# Patient Record
Sex: Male | Born: 1970
Health system: Southern US, Community
[De-identification: ages and names within clinical notes are randomized; demographics above are authoritative.]

## PROBLEM LIST (undated history)

## (undated) DIAGNOSIS — E119 Type 2 diabetes mellitus without complications: Secondary | ICD-10-CM

## (undated) DIAGNOSIS — G629 Polyneuropathy, unspecified: Secondary | ICD-10-CM

## (undated) DIAGNOSIS — I739 Peripheral vascular disease, unspecified: Secondary | ICD-10-CM

## (undated) DIAGNOSIS — I10 Essential (primary) hypertension: Secondary | ICD-10-CM

## (undated) DIAGNOSIS — M1990 Unspecified osteoarthritis, unspecified site: Secondary | ICD-10-CM

## (undated) HISTORY — PX: TRANSPLANT PANCREATIC ALLOGRAFT: SUR1383

## (undated) HISTORY — PX: ANKLE SURGERY: SHX546

## (undated) HISTORY — PX: BACK SURGERY: SHX140

## (undated) HISTORY — PX: HAND SURGERY: SHX662

## (undated) HISTORY — PX: KIDNEY TRANSPLANT: SHX239

## (undated) HISTORY — PX: FOOT SURGERY: SHX648

---

## 2011-02-08 DIAGNOSIS — E109 Type 1 diabetes mellitus without complications: Secondary | ICD-10-CM | POA: Insufficient documentation

## 2011-02-08 DIAGNOSIS — R809 Proteinuria, unspecified: Secondary | ICD-10-CM | POA: Insufficient documentation

## 2011-02-08 DIAGNOSIS — E113299 Type 2 diabetes mellitus with mild nonproliferative diabetic retinopathy without macular edema, unspecified eye: Secondary | ICD-10-CM | POA: Insufficient documentation

## 2011-02-08 DIAGNOSIS — E1042 Type 1 diabetes mellitus with diabetic polyneuropathy: Secondary | ICD-10-CM | POA: Insufficient documentation

## 2011-02-08 DIAGNOSIS — E1142 Type 2 diabetes mellitus with diabetic polyneuropathy: Secondary | ICD-10-CM | POA: Insufficient documentation

## 2012-01-17 DIAGNOSIS — G5750 Tarsal tunnel syndrome, unspecified lower limb: Secondary | ICD-10-CM | POA: Insufficient documentation

## 2012-01-17 DIAGNOSIS — S62109A Fracture of unspecified carpal bone, unspecified wrist, initial encounter for closed fracture: Secondary | ICD-10-CM | POA: Insufficient documentation

## 2013-07-19 DIAGNOSIS — E1143 Type 2 diabetes mellitus with diabetic autonomic (poly)neuropathy: Secondary | ICD-10-CM | POA: Insufficient documentation

## 2014-09-08 DIAGNOSIS — M19049 Primary osteoarthritis, unspecified hand: Secondary | ICD-10-CM | POA: Insufficient documentation

## 2015-05-12 DIAGNOSIS — E1065 Type 1 diabetes mellitus with hyperglycemia: Secondary | ICD-10-CM | POA: Insufficient documentation

## 2015-05-12 DIAGNOSIS — Z949 Transplanted organ and tissue status, unspecified: Secondary | ICD-10-CM | POA: Insufficient documentation

## 2015-05-12 DIAGNOSIS — I10 Essential (primary) hypertension: Secondary | ICD-10-CM | POA: Insufficient documentation

## 2015-05-12 DIAGNOSIS — E782 Mixed hyperlipidemia: Secondary | ICD-10-CM | POA: Insufficient documentation

## 2015-06-09 DIAGNOSIS — Z9489 Other transplanted organ and tissue status: Secondary | ICD-10-CM | POA: Diagnosis not present

## 2015-06-16 DIAGNOSIS — Z9489 Other transplanted organ and tissue status: Secondary | ICD-10-CM | POA: Diagnosis not present

## 2015-06-22 DIAGNOSIS — Z9489 Other transplanted organ and tissue status: Secondary | ICD-10-CM | POA: Diagnosis not present

## 2015-06-29 DIAGNOSIS — Z9489 Other transplanted organ and tissue status: Secondary | ICD-10-CM | POA: Diagnosis not present

## 2015-07-07 DIAGNOSIS — Z9489 Other transplanted organ and tissue status: Secondary | ICD-10-CM | POA: Diagnosis not present

## 2015-07-10 DIAGNOSIS — R21 Rash and other nonspecific skin eruption: Secondary | ICD-10-CM | POA: Diagnosis not present

## 2015-07-10 DIAGNOSIS — I10 Essential (primary) hypertension: Secondary | ICD-10-CM | POA: Diagnosis not present

## 2015-07-10 DIAGNOSIS — E11319 Type 2 diabetes mellitus with unspecified diabetic retinopathy without macular edema: Secondary | ICD-10-CM | POA: Diagnosis not present

## 2015-07-10 DIAGNOSIS — G4733 Obstructive sleep apnea (adult) (pediatric): Secondary | ICD-10-CM | POA: Diagnosis not present

## 2015-07-10 DIAGNOSIS — E782 Mixed hyperlipidemia: Secondary | ICD-10-CM | POA: Diagnosis not present

## 2015-07-10 DIAGNOSIS — N183 Chronic kidney disease, stage 3 (moderate): Secondary | ICD-10-CM | POA: Diagnosis not present

## 2015-07-10 DIAGNOSIS — G2581 Restless legs syndrome: Secondary | ICD-10-CM | POA: Diagnosis not present

## 2015-07-10 DIAGNOSIS — E1121 Type 2 diabetes mellitus with diabetic nephropathy: Secondary | ICD-10-CM | POA: Diagnosis not present

## 2015-07-20 DIAGNOSIS — Z9489 Other transplanted organ and tissue status: Secondary | ICD-10-CM | POA: Diagnosis not present

## 2015-09-14 DIAGNOSIS — Z9489 Other transplanted organ and tissue status: Secondary | ICD-10-CM | POA: Diagnosis not present

## 2015-10-12 DIAGNOSIS — Z9489 Other transplanted organ and tissue status: Secondary | ICD-10-CM | POA: Diagnosis not present

## 2015-11-05 DIAGNOSIS — M5136 Other intervertebral disc degeneration, lumbar region: Secondary | ICD-10-CM | POA: Diagnosis not present

## 2015-11-05 DIAGNOSIS — M9903 Segmental and somatic dysfunction of lumbar region: Secondary | ICD-10-CM | POA: Diagnosis not present

## 2015-11-05 DIAGNOSIS — M9904 Segmental and somatic dysfunction of sacral region: Secondary | ICD-10-CM | POA: Diagnosis not present

## 2015-11-05 DIAGNOSIS — M9905 Segmental and somatic dysfunction of pelvic region: Secondary | ICD-10-CM | POA: Diagnosis not present

## 2015-11-11 DIAGNOSIS — Z9489 Other transplanted organ and tissue status: Secondary | ICD-10-CM | POA: Diagnosis not present

## 2015-11-19 ENCOUNTER — Other Ambulatory Visit: Payer: Self-pay | Admitting: *Deleted

## 2015-11-19 ENCOUNTER — Ambulatory Visit (INDEPENDENT_AMBULATORY_CARE_PROVIDER_SITE_OTHER): Payer: Medicare Other

## 2015-11-19 ENCOUNTER — Ambulatory Visit (INDEPENDENT_AMBULATORY_CARE_PROVIDER_SITE_OTHER): Payer: Medicare Other | Admitting: Podiatry

## 2015-11-19 ENCOUNTER — Encounter: Payer: Self-pay | Admitting: Podiatry

## 2015-11-19 VITALS — BP 155/90 | HR 84 | Resp 16

## 2015-11-19 DIAGNOSIS — E0841 Diabetes mellitus due to underlying condition with diabetic mononeuropathy: Secondary | ICD-10-CM

## 2015-11-19 DIAGNOSIS — M722 Plantar fascial fibromatosis: Secondary | ICD-10-CM | POA: Diagnosis not present

## 2015-11-19 DIAGNOSIS — I10 Essential (primary) hypertension: Secondary | ICD-10-CM | POA: Insufficient documentation

## 2015-11-19 MED ORDER — TRIAMCINOLONE ACETONIDE 10 MG/ML IJ SUSP
10.0000 mg | Freq: Once | INTRAMUSCULAR | Status: AC
  Administered 2015-11-19: 10 mg

## 2015-11-19 NOTE — Patient Instructions (Signed)

## 2015-11-19 NOTE — Progress Notes (Signed)
   Subjective:    Patient ID: Gregory Allen, male    DOB: 1971-05-07, 45 y.o.   MRN: [id number]  HPI Comments: "I have a lot of pain on the inside of this foot"  Patient presents with: Foot Pain: Plantar heel left - aching for about 4 months, severe pain in AM, tried stretching, has neuropathy so he takes Lyrica for pain and sometimes Tylenol-not helping  Diabetic - last A1C was 6.5  Foot Pain      Review of Systems  Cardiovascular: Positive for leg swelling.  Musculoskeletal: Positive for gait problem.  All other systems reviewed and are negative.      Objective:   Physical Exam        Assessment & Plan:

## 2015-11-22 NOTE — Progress Notes (Signed)
Subjective:     Patient ID: Gregory Allen, male   DOB: [birth year], 45 y.o.   MRN: [id number]  HPI patient presents with a lot of pain in his left heel for 4 months duration and a long-term history of diabetes since he was a child that used to be in poor control and is now better   Review of Systems  All other systems reviewed and are negative.      Objective:   Physical Exam  Constitutional: He is oriented to person, place, and time.  Cardiovascular: Intact distal pulses.   Musculoskeletal: Normal range of motion.  Neurological: He is alert and oriented to person, place, and time.  Skin: Skin is warm and dry.  Nursing note and vitals reviewed.  neurovascular status was found to be intact with muscle strength adequate range of motion within normal limits and patient noted to have quite a bit of discomfort in the left plantar heel at the insertional point of the tendon into the calcaneus. Patient had mild distal disease secondary to long-term diabetes but he does appear to have adequate circulatory neurological sensation currently     Assessment:     Acute plantar fasciitis and long-term diabetic    Plan:     H&P and x-rays reviewed and today careful injection administered plantar heel left 3 mg Kenalog 5 mg Xylocaine and instructed on physical therapy and reduced activity. Reviewed calcification of the dorsalis pedis small arteries on his x-ray and I want him to take very good care of himself and if any issues were to occur be aggressive with returning to his physician. Reappoint in the next several weeks and also dispensed fascial brace

## 2015-11-23 DIAGNOSIS — M9905 Segmental and somatic dysfunction of pelvic region: Secondary | ICD-10-CM | POA: Diagnosis not present

## 2015-11-23 DIAGNOSIS — M9904 Segmental and somatic dysfunction of sacral region: Secondary | ICD-10-CM | POA: Diagnosis not present

## 2015-11-23 DIAGNOSIS — M9903 Segmental and somatic dysfunction of lumbar region: Secondary | ICD-10-CM | POA: Diagnosis not present

## 2015-11-23 DIAGNOSIS — M5136 Other intervertebral disc degeneration, lumbar region: Secondary | ICD-10-CM | POA: Diagnosis not present

## 2015-11-25 DIAGNOSIS — M9903 Segmental and somatic dysfunction of lumbar region: Secondary | ICD-10-CM | POA: Diagnosis not present

## 2015-11-25 DIAGNOSIS — M5136 Other intervertebral disc degeneration, lumbar region: Secondary | ICD-10-CM | POA: Diagnosis not present

## 2015-11-25 DIAGNOSIS — M9905 Segmental and somatic dysfunction of pelvic region: Secondary | ICD-10-CM | POA: Diagnosis not present

## 2015-11-25 DIAGNOSIS — M9904 Segmental and somatic dysfunction of sacral region: Secondary | ICD-10-CM | POA: Diagnosis not present

## 2015-11-26 DIAGNOSIS — I739 Peripheral vascular disease, unspecified: Secondary | ICD-10-CM | POA: Diagnosis not present

## 2015-11-30 DIAGNOSIS — M9903 Segmental and somatic dysfunction of lumbar region: Secondary | ICD-10-CM | POA: Diagnosis not present

## 2015-11-30 DIAGNOSIS — M9905 Segmental and somatic dysfunction of pelvic region: Secondary | ICD-10-CM | POA: Diagnosis not present

## 2015-11-30 DIAGNOSIS — M9904 Segmental and somatic dysfunction of sacral region: Secondary | ICD-10-CM | POA: Diagnosis not present

## 2015-11-30 DIAGNOSIS — M5136 Other intervertebral disc degeneration, lumbar region: Secondary | ICD-10-CM | POA: Diagnosis not present

## 2015-12-03 ENCOUNTER — Ambulatory Visit (INDEPENDENT_AMBULATORY_CARE_PROVIDER_SITE_OTHER): Payer: Medicare Other | Admitting: Podiatry

## 2015-12-03 ENCOUNTER — Encounter: Payer: Self-pay | Admitting: Podiatry

## 2015-12-03 VITALS — BP 158/87 | HR 79 | Resp 16

## 2015-12-03 DIAGNOSIS — M722 Plantar fascial fibromatosis: Secondary | ICD-10-CM

## 2015-12-03 MED ORDER — TRIAMCINOLONE ACETONIDE 10 MG/ML IJ SUSP
10.0000 mg | Freq: Once | INTRAMUSCULAR | Status: AC
  Administered 2015-12-03: 10 mg

## 2015-12-03 NOTE — Progress Notes (Signed)
Subjective:     Patient ID: Gregory Allen, male   DOB: 24-Sep-1971, 45 y.o.   MRN: [id number]  HPI patient presents stating I'm still getting a lot of pain in my left heel and it's worse after I get up in the morning or after periods of sitting. There has been some improvement but still sore and also circulation checked and I do wear orthotics   Review of Systems     Objective:   Physical Exam Neurovascular status unchanged with pain in the plantar aspect left heel that's mildly improved but still quite tender when palpated    Assessment:     Continue plantar fasciitis left    Plan:     I did reinject 3 mg Kenalog 5 mill grams Xylocaine and advised on checking his sugar more carefully over the next week. I then went ahead and dispensed night splint with all instructions on usage and patient will be seen back to recheck

## 2015-12-09 DIAGNOSIS — M9903 Segmental and somatic dysfunction of lumbar region: Secondary | ICD-10-CM | POA: Diagnosis not present

## 2015-12-09 DIAGNOSIS — M9904 Segmental and somatic dysfunction of sacral region: Secondary | ICD-10-CM | POA: Diagnosis not present

## 2015-12-09 DIAGNOSIS — M5136 Other intervertebral disc degeneration, lumbar region: Secondary | ICD-10-CM | POA: Diagnosis not present

## 2015-12-09 DIAGNOSIS — M9905 Segmental and somatic dysfunction of pelvic region: Secondary | ICD-10-CM | POA: Diagnosis not present

## 2015-12-14 DIAGNOSIS — M9904 Segmental and somatic dysfunction of sacral region: Secondary | ICD-10-CM | POA: Diagnosis not present

## 2015-12-14 DIAGNOSIS — M9905 Segmental and somatic dysfunction of pelvic region: Secondary | ICD-10-CM | POA: Diagnosis not present

## 2015-12-14 DIAGNOSIS — M5136 Other intervertebral disc degeneration, lumbar region: Secondary | ICD-10-CM | POA: Diagnosis not present

## 2015-12-14 DIAGNOSIS — M9903 Segmental and somatic dysfunction of lumbar region: Secondary | ICD-10-CM | POA: Diagnosis not present

## 2015-12-16 DIAGNOSIS — E108 Type 1 diabetes mellitus with unspecified complications: Secondary | ICD-10-CM | POA: Diagnosis not present

## 2015-12-16 DIAGNOSIS — Z9489 Other transplanted organ and tissue status: Secondary | ICD-10-CM | POA: Diagnosis not present

## 2015-12-21 ENCOUNTER — Emergency Department (HOSPITAL_COMMUNITY)
Admission: EM | Admit: 2015-12-21 | Discharge: 2015-12-21 | Disposition: A | Discharge status: Home / Self Care | Payer: Medicare Other | Attending: Emergency Medicine | Admitting: Emergency Medicine

## 2015-12-21 ENCOUNTER — Encounter (HOSPITAL_COMMUNITY): Payer: Self-pay | Admitting: Emergency Medicine

## 2015-12-21 ENCOUNTER — Emergency Department (HOSPITAL_COMMUNITY): Payer: Medicare Other

## 2015-12-21 DIAGNOSIS — R110 Nausea: Secondary | ICD-10-CM | POA: Diagnosis not present

## 2015-12-21 DIAGNOSIS — H539 Unspecified visual disturbance: Secondary | ICD-10-CM | POA: Insufficient documentation

## 2015-12-21 DIAGNOSIS — E119 Type 2 diabetes mellitus without complications: Secondary | ICD-10-CM | POA: Insufficient documentation

## 2015-12-21 DIAGNOSIS — Z794 Long term (current) use of insulin: Secondary | ICD-10-CM | POA: Diagnosis not present

## 2015-12-21 DIAGNOSIS — R42 Dizziness and giddiness: Secondary | ICD-10-CM | POA: Diagnosis not present

## 2015-12-21 DIAGNOSIS — R200 Anesthesia of skin: Secondary | ICD-10-CM | POA: Insufficient documentation

## 2015-12-21 DIAGNOSIS — Z79899 Other long term (current) drug therapy: Secondary | ICD-10-CM | POA: Insufficient documentation

## 2015-12-21 DIAGNOSIS — R112 Nausea with vomiting, unspecified: Secondary | ICD-10-CM | POA: Insufficient documentation

## 2015-12-21 DIAGNOSIS — R519 Headache, unspecified: Secondary | ICD-10-CM

## 2015-12-21 DIAGNOSIS — R51 Headache: Secondary | ICD-10-CM | POA: Diagnosis not present

## 2015-12-21 HISTORY — DX: Type 2 diabetes mellitus without complications: E11.9

## 2015-12-21 LAB — CBC
HEMATOCRIT: 44.4 % (ref 39.0–52.0)
Hemoglobin: 14.2 g/dL (ref 13.0–17.0)
MCH: 25.5 pg — ABNORMAL LOW (ref 26.0–34.0)
MCHC: 32 g/dL (ref 30.0–36.0)
MCV: 79.7 fL (ref 78.0–100.0)
PLATELETS: 245 10*3/uL (ref 150–400)
RBC: 5.57 MIL/uL (ref 4.22–5.81)
RDW: 13.3 % (ref 11.5–15.5)
WBC: 6.2 10*3/uL (ref 4.0–10.5)

## 2015-12-21 LAB — URINALYSIS, ROUTINE W REFLEX MICROSCOPIC
BILIRUBIN URINE: NEGATIVE
GLUCOSE, UA: NEGATIVE mg/dL
KETONES UR: 15 mg/dL — AB
Leukocytes, UA: NEGATIVE
NITRITE: NEGATIVE
PH: 5 (ref 5.0–8.0)
Specific Gravity, Urine: 1.021 (ref 1.005–1.030)

## 2015-12-21 LAB — BASIC METABOLIC PANEL
Anion gap: 12 (ref 5–15)
BUN: 19 mg/dL (ref 6–20)
CO2: 21 mmol/L — ABNORMAL LOW (ref 22–32)
CREATININE: 1.42 mg/dL — AB (ref 0.61–1.24)
Calcium: 9.3 mg/dL (ref 8.9–10.3)
Chloride: 108 mmol/L (ref 101–111)
GFR calc Af Amer: >60 mL/min (ref >60)
GFR, EST NON AFRICAN AMERICAN: 58 mL/min — AB (ref >60)
GLUCOSE: 70 mg/dL (ref 65–99)
POTASSIUM: 4.2 mmol/L (ref 3.5–5.1)
SODIUM: 141 mmol/L (ref 135–145)

## 2015-12-21 LAB — URINE MICROSCOPIC-ADD ON

## 2015-12-21 LAB — CBG MONITORING, ED: Glucose-Capillary: 117 mg/dL — ABNORMAL HIGH (ref 65–99)

## 2015-12-21 MED ORDER — KETOROLAC TROMETHAMINE 30 MG/ML IJ SOLN
30.0000 mg | Freq: Once | INTRAMUSCULAR | Status: AC
  Administered 2015-12-21: 30 mg via INTRAVENOUS
  Filled 2015-12-21: qty 1

## 2015-12-21 MED ORDER — DIPHENHYDRAMINE HCL 50 MG/ML IJ SOLN
25.0000 mg | Freq: Once | INTRAMUSCULAR | Status: AC
  Administered 2015-12-21: 25 mg via INTRAVENOUS
  Filled 2015-12-21: qty 1

## 2015-12-21 MED ORDER — METOCLOPRAMIDE HCL 5 MG/ML IJ SOLN
10.0000 mg | Freq: Once | INTRAMUSCULAR | Status: AC
  Administered 2015-12-21: 10 mg via INTRAVENOUS
  Filled 2015-12-21: qty 2

## 2015-12-21 MED ORDER — SODIUM CHLORIDE 0.9 % IV BOLUS (SEPSIS)
1000.0000 mL | Freq: Once | INTRAVENOUS | Status: AC
  Administered 2015-12-21: 1000 mL via INTRAVENOUS

## 2015-12-21 NOTE — ED Notes (Signed)
Pt ambulates independently and with steady gait at time of discharge. Discharge instructions and follow up information reviewed with patient. No other questions or concerns voiced at this time.  

## 2015-12-21 NOTE — ED Notes (Signed)
PTs CBG 117 reported to RN.

## 2015-12-21 NOTE — ED Notes (Signed)
pts IV removed and vital signs updated. Pt awaiting discharge paperwork at bedside.

## 2015-12-21 NOTE — ED Provider Notes (Signed)
CSN: [id number]     Arrival date & time 12/21/15  1158 History   By signing my name below, I, Dora Sims, attest that this documentation has been prepared under the direction and in the presence of non-physician practitioner, Delrae Rend, PA-C. Electronically Signed: Dora Sims, Scribe. 12/21/2015. 4:05 PM.    Chief Complaint  Patient presents with  . Headache    The history is provided by the patient. No language interpreter was used.     HPI Comments: Gregory Allen is a 45 y.o. male with h/o DM who presents to the Emergency Department complaining of severe, sharp, constant, sudden onset migraine beginning this morning. Pt states that the migraine is located on the left side of his face; he reports associated blurry vision in his left eye and numbness in his left hand. Pt states that he is able to ambulate but has had difficulty balancing since onset of symptoms due to dizziness. He reports associated nausea and vomiting. He reports that his last episode of vomiting occurred approximately one hour ago and denies hematemesis. He denies fever, diarrhea, chills, or any other associated symptoms at this time.    Past Medical History  Diagnosis Date  . Diabetes mellitus without complication [facility])    Past Surgical History  Procedure Laterality Date  . Transplant pancreatic allograft     No family history on file. Social History  Substance Use Topics  . Smoking status: Never Smoker   . Smokeless tobacco: None  . Alcohol Use: No    Review of Systems  Constitutional: Negative for fever and chills.  Eyes: Positive for visual disturbance (blurry vision left eye).  Gastrointestinal: Positive for nausea and vomiting. Negative for diarrhea.  Neurological: Positive for dizziness, numbness (left hand) and headaches. Negative for speech difficulty.  All other systems reviewed and are negative.     Allergies  Shellfish-derived products; Hydrocodone; and Oxycodone  Home  Medications   Prior to Admission medications   Medication Sig Start Date End Date Taking? Authorizing Provider  Ca & Phos-Vit D-Mag 600-280-500-50 TABS Take 1 tablet by mouth. 04/24/12   Historical Provider, MD  carvedilol (COREG) 6.25 MG tablet Take 6.25 mg by mouth.    Historical Provider, MD  Chlorzoxazone (LORZONE) 375 MG TABS Take 1 tablet by mouth.    Historical Provider, MD  DULoxetine (CYMBALTA) 60 MG capsule Take 60 mg by mouth.    Historical Provider, MD  glucagon (GLUCAGON EMERGENCY) 1 MG injection Inject 1 mg into the vein.    Historical Provider, MD  glucose blood (FREESTYLE TEST STRIPS) test strip To be tested 4 times daily 02/14/12   Historical Provider, MD  hydrALAZINE (APRESOLINE) 25 MG tablet Take 25 mg by mouth.    Historical Provider, MD  insulin aspart (NOVOLOG) 100 UNIT/ML injection As directed, using insulin pump. 07/18/12   Historical Provider, MD  Insulin Pen Needle (B-D ULTRAFINE III SHORT PEN) 31G X 8 MM MISC 1 each by Does not apply route. 07/18/12   Historical Provider, MD  Insulin Syringe-Needle U-100 (B-D INSULIN SYRINGE 1CC/25GX1") 25G X 1" 1 ML MISC Use as directed 07/19/12   Historical Provider, MD  losartan (COZAAR) 100 MG tablet TAKE 1 TABLET BY MOUTH ONCE DAILY. 11/04/12   Historical Provider, MD  mycophenolate (MYFORTIC) 180 MG EC tablet Take 320 mg by mouth 2 times daily.    Historical Provider, MD  pregabalin (LYRICA) 150 MG capsule Take 150 mg by mouth.    Historical Provider, MD  tacrolimus (PROGRAF) 5  MG capsule Take 5 mg by mouth 2 times daily.    Historical Provider, MD  valGANciclovir (VALCYTE) 450 MG tablet TAKE TWO TABLETS BY MOUTH ONCE DAILY 08/24/15   Historical Provider, MD   BP 139/84 mmHg  Pulse 75  Temp(Src) 98 F (36.7 C) (Oral)  Resp 16  Ht 5\' 10"  (1.778 m)  Wt 201 lb (91.173 kg)  BMI 28.84 kg/m2  SpO2 99% Physical Exam  Constitutional: He is oriented to person, place, and time. He appears well-developed and well-nourished. No distress.   HENT:  Head: Normocephalic and atraumatic.  Right Ear: External ear normal.  Left Ear: External ear normal.  Eyes: Conjunctivae are normal. Right eye exhibits no discharge. Left eye exhibits no discharge. No scleral icterus.  Neck: Neck supple.  Cardiovascular: Normal rate, regular rhythm and intact distal pulses.   Pulmonary/Chest: Effort normal and breath sounds normal. No respiratory distress.  Abdominal: Soft. Bowel sounds are normal. He exhibits no distension. There is no tenderness.  Musculoskeletal: He exhibits no edema or tenderness.  Neurological: He is alert and oriented to person, place, and time. He has normal strength. He displays no tremor. No cranial nerve deficit (no facial droop, extraocular movements intact, no slurred speech) or sensory deficit. Coordination and gait normal. GCS eye subscore is 4. GCS verbal subscore is 5. GCS motor subscore is 6.  Skin: Skin is warm and dry. No rash noted.  Psychiatric: He has a normal mood and affect.  Nursing note and vitals reviewed.   ED Course  Procedures (including critical care time)  DIAGNOSTIC STUDIES: Oxygen Saturation is 99% on RA, normal by my interpretation.    COORDINATION OF CARE:  4:06 PM Will administer fluids, Toradol, Reglan, and Benadryl. Will order CT Head w/o Contrast. Will order blood work. Discussed treatment plan with pt at bedside and pt agreed to plan.   Labs Review Labs Reviewed  BASIC METABOLIC PANEL - Abnormal; Notable for the following:    CO2 21 (*)    Creatinine, Ser 1.42 (*)    GFR calc non Af Amer 58 (*)    All other components within normal limits  CBC - Abnormal; Notable for the following:    MCH 25.5 (*)    All other components within normal limits  URINALYSIS, ROUTINE W REFLEX MICROSCOPIC (NOT AT [facility])  CBG MONITORING, ED    Imaging Review Ct Head Wo Contrast  12/21/2015  CLINICAL DATA:  Acute onset headache, dizziness and nausea today at 9 a.m. No known injury Initial encounter.  EXAM: CT HEAD WITHOUT CONTRAST TECHNIQUE: Contiguous axial images were obtained from the base of the skull through the vertex without intravenous contrast. COMPARISON:  None. FINDINGS: The brain appears normal without hemorrhage, infarct, mass lesion, mass effect, midline shift or abnormal extra-axial fluid collection. No hydrocephalus or pneumocephalus. The calvarium is intact. Imaged paranasal sinuses and mastoid air cells are clear. IMPRESSION: Normal head CT. Electronically Signed   By: Inge Rise M.D.   On: 12/21/2015 14:03   I have personally reviewed and evaluated these images and lab results as part of my medical decision-making.   EKG Interpretation None      MDM   Final diagnoses:  Acute nonintractable headache, unspecified headache type    Labs reveal SCr 1.42, CT head negative. Nonfocal neuro exam. Pt is afebrile with no meningismus. Headache resolved with 1L NS bolus, toradol, reglan, and benadryl. Instructed to f/u with PCP. ER return precautions given.   I personally performed the services  described in this documentation, which was scribed in my presence. The recorded information has been reviewed and is accurate.     Anne Ng, PA-C 12/21/15 [place], MD 12/22/15 [phone]

## 2015-12-21 NOTE — ED Notes (Addendum)
Pt states around 0900 he developed a sudden headache with nausea and dizziness. Pt states light makes his headache worse. Pt is alert and ox4. Pt has clear speech no other neuro deficits noted. PT HAS HAD PANCREATIC ISLET TRANSPLANT AND NOT TO HAVE ANY STEROIDS, OR MACROLIDES, OR d5 iv FLUID.

## 2015-12-21 NOTE — Discharge Instructions (Signed)
Your headache resolved with medication in the ER today. You likely had a migraine. Your labs were normal. Your CT scan was normal. Please follow up with your primary care provider within one week. Return to the ER for new or worsening symptoms.

## 2015-12-23 LAB — URINE CULTURE: Culture: NO GROWTH

## 2015-12-26 ENCOUNTER — Encounter (HOSPITAL_COMMUNITY): Payer: Self-pay

## 2015-12-26 DIAGNOSIS — E119 Type 2 diabetes mellitus without complications: Secondary | ICD-10-CM | POA: Diagnosis not present

## 2015-12-26 DIAGNOSIS — H538 Other visual disturbances: Secondary | ICD-10-CM | POA: Diagnosis not present

## 2015-12-26 DIAGNOSIS — R51 Headache: Secondary | ICD-10-CM | POA: Diagnosis not present

## 2015-12-26 LAB — CBC
HEMATOCRIT: 39.3 % (ref 39.0–52.0)
HEMOGLOBIN: 12.2 g/dL — AB (ref 13.0–17.0)
MCH: 24.7 pg — AB (ref 26.0–34.0)
MCHC: 31 g/dL (ref 30.0–36.0)
MCV: 79.6 fL (ref 78.0–100.0)
Platelets: 249 10*3/uL (ref 150–400)
RBC: 4.94 MIL/uL (ref 4.22–5.81)
RDW: 13.3 % (ref 11.5–15.5)
WBC: 5 10*3/uL (ref 4.0–10.5)

## 2015-12-26 NOTE — ED Notes (Signed)
Pt here for a bad headache was here a few days ago for the same thing and was not given any answers, pt reports this is only the third bad headahce he has ever had. Left eye blurred as well.

## 2015-12-27 ENCOUNTER — Emergency Department (HOSPITAL_COMMUNITY)
Admission: EM | Admit: 2015-12-27 | Discharge: 2015-12-27 | Disposition: A | Discharge status: Home / Self Care | Payer: Medicare Other | Attending: Emergency Medicine | Admitting: Emergency Medicine

## 2015-12-27 LAB — COMPREHENSIVE METABOLIC PANEL
ALBUMIN: 3.7 g/dL (ref 3.5–5.0)
ALK PHOS: 63 U/L (ref 38–126)
ALT: 19 U/L (ref 17–63)
ANION GAP: 10 (ref 5–15)
AST: 20 U/L (ref 15–41)
BILIRUBIN TOTAL: 0.2 mg/dL — AB (ref 0.3–1.2)
BUN: 20 mg/dL (ref 6–20)
CALCIUM: 8.8 mg/dL — AB (ref 8.9–10.3)
CO2: 21 mmol/L — ABNORMAL LOW (ref 22–32)
CREATININE: 1.46 mg/dL — AB (ref 0.61–1.24)
Chloride: 110 mmol/L (ref 101–111)
GFR calc Af Amer: >60 mL/min (ref >60)
GFR calc non Af Amer: 56 mL/min — ABNORMAL LOW (ref >60)
GLUCOSE: 116 mg/dL — AB (ref 65–99)
Potassium: 4.2 mmol/L (ref 3.5–5.1)
SODIUM: 141 mmol/L (ref 135–145)
TOTAL PROTEIN: 6.1 g/dL — AB (ref 6.5–8.1)

## 2015-12-27 NOTE — ED Notes (Signed)
No answer called x 3

## 2015-12-31 ENCOUNTER — Encounter: Payer: Self-pay | Admitting: Podiatry

## 2015-12-31 ENCOUNTER — Ambulatory Visit (INDEPENDENT_AMBULATORY_CARE_PROVIDER_SITE_OTHER): Payer: Medicare Other | Admitting: Podiatry

## 2015-12-31 ENCOUNTER — Telehealth: Payer: Self-pay | Admitting: *Deleted

## 2015-12-31 VITALS — BP 166/81 | HR 79 | Resp 16

## 2015-12-31 DIAGNOSIS — M722 Plantar fascial fibromatosis: Secondary | ICD-10-CM | POA: Diagnosis not present

## 2015-12-31 DIAGNOSIS — E0841 Diabetes mellitus due to underlying condition with diabetic mononeuropathy: Secondary | ICD-10-CM | POA: Diagnosis not present

## 2015-12-31 MED ORDER — NONFORMULARY OR COMPOUNDED ITEM: Status: DC

## 2015-12-31 NOTE — Progress Notes (Signed)
Subjective:     Patient ID: Gregory Allen, male   DOB: 08-Jul-1971, 45 y.o.   MRN: [id number]  HPI patient presents stating I'm still getting pain in my left heel and now my right foot hurts slightly   Review of Systems     Objective:   Physical Exam Long-term diabetic who most likely has circulatory disease that is followed by vascular doctor but there is nothing at this point they can do for it with plantar heel pain left over right    Assessment:     Plantar fasciitis with other conditions which may be contributory    Plan:     We are going to try cast immobilization to reduce pressure and we continue to try to avoid any form of surgical intervention

## 2015-12-31 NOTE — Telephone Encounter (Signed)
Dr. Paulla Dolly ordered [place] Peripheral Neuropathy cream.  Faxed.

## 2016-01-05 DIAGNOSIS — M9903 Segmental and somatic dysfunction of lumbar region: Secondary | ICD-10-CM | POA: Diagnosis not present

## 2016-01-05 DIAGNOSIS — M5136 Other intervertebral disc degeneration, lumbar region: Secondary | ICD-10-CM | POA: Diagnosis not present

## 2016-01-05 DIAGNOSIS — M9905 Segmental and somatic dysfunction of pelvic region: Secondary | ICD-10-CM | POA: Diagnosis not present

## 2016-01-05 DIAGNOSIS — M9904 Segmental and somatic dysfunction of sacral region: Secondary | ICD-10-CM | POA: Diagnosis not present

## 2016-01-12 DIAGNOSIS — M9903 Segmental and somatic dysfunction of lumbar region: Secondary | ICD-10-CM | POA: Diagnosis not present

## 2016-01-12 DIAGNOSIS — M9905 Segmental and somatic dysfunction of pelvic region: Secondary | ICD-10-CM | POA: Diagnosis not present

## 2016-01-12 DIAGNOSIS — M5136 Other intervertebral disc degeneration, lumbar region: Secondary | ICD-10-CM | POA: Diagnosis not present

## 2016-01-12 DIAGNOSIS — M9904 Segmental and somatic dysfunction of sacral region: Secondary | ICD-10-CM | POA: Diagnosis not present

## 2016-01-28 ENCOUNTER — Encounter: Payer: Self-pay | Admitting: Podiatry

## 2016-01-28 ENCOUNTER — Ambulatory Visit (INDEPENDENT_AMBULATORY_CARE_PROVIDER_SITE_OTHER): Payer: Medicare Other | Admitting: Podiatry

## 2016-01-28 VITALS — BP 160/91 | HR 77 | Resp 16

## 2016-01-28 DIAGNOSIS — M722 Plantar fascial fibromatosis: Secondary | ICD-10-CM

## 2016-01-28 DIAGNOSIS — Z9489 Other transplanted organ and tissue status: Secondary | ICD-10-CM | POA: Diagnosis not present

## 2016-01-28 NOTE — Patient Instructions (Signed)
Pre-Operative Instructions  Congratulations, you have decided to take an important step to improving your quality of life.  You can be assured that the doctors of [facility] will be with you every step of the way.  1. Plan to be at the surgery center/hospital at least 1 (one) hour prior to your scheduled time unless otherwise directed by the surgical center/hospital staff.  You must have a responsible adult accompany you, remain during the surgery and drive you home.  Make sure you have directions to the surgical center/hospital and know how to get there on time. 2. For hospital based surgery you will need to obtain a history and physical form from your family physician within 1 month prior to the date of surgery- we will give you a form for you primary physician.  3. We make every effort to accommodate the date you request for surgery.  There are however, times where surgery dates or times have to be moved.  We will contact you as soon as possible if a change in schedule is required.   4. No Aspirin/Ibuprofen for one week before surgery.  If you are on aspirin, any non-steroidal anti-inflammatory medications (Mobic, Aleve, Ibuprofen) you should stop taking it 7 days prior to your surgery.  You make take Tylenol  For pain prior to surgery.  5. Medications- If you are taking daily heart and blood pressure medications, seizure, reflux, allergy, asthma, anxiety, pain or diabetes medications, make sure the surgery center/hospital is aware before the day of surgery so they may notify you which medications to take or avoid the day of surgery. 6. No food or drink after midnight the night before surgery unless directed otherwise by surgical center/hospital staff. 7. No alcoholic beverages 24 hours prior to surgery.  No smoking 24 hours prior to or 24 hours after surgery. 8. Wear loose pants or shorts- loose enough to fit over bandages, boots, and casts. 9. No slip on shoes, sneakers are best. 10. Bring  your boot with you to the surgery center/hospital.  Also bring crutches or a walker if your physician has prescribed it for you.  If you do not have this equipment, it will be provided for you after surgery. 11. If you have not been contracted by the surgery center/hospital by the day before your surgery, call to confirm the date and time of your surgery. 12. Leave-time from work may vary depending on the type of surgery you have.  Appropriate arrangements should be made prior to surgery with your employer. 13. Prescriptions will be provided immediately following surgery by your doctor.  Have these filled as soon as possible after surgery and take the medication as directed. 14. Remove nail polish on the operative foot. 15. Wash the night before surgery.  The night before surgery wash the foot and leg well with the antibacterial soap provided and water paying special attention to beneath the toenails and in between the toes.  Rinse thoroughly with water and dry well with a towel.  Perform this wash unless told not to do so by your physician.  Enclosed: 1 Ice pack (please put in freezer the night before surgery)   1 Hibiclens skin cleaner   Pre-op Instructions  If you have any questions regarding the instructions, do not hesitate to call our office.  [place]: [address] [place], [place] [address] [phone]  [place]: [address] , [place] [address] [phone]  [place]: [address]  [place], [place] [address] [phone]  Dr. Richard   Tuchman DPM, Dr. Norman Regal DPM Dr. Richard Sikora DPM, Dr. M. Todd Hyatt DPM, Dr. Kathryn Egerton DPM 

## 2016-01-28 NOTE — Progress Notes (Signed)
Subjective:     Patient ID: Gregory Allen, male   DOB: 01/16/1971, 45 y.o.   MRN: [id number]  HPI patient states my left heel is still very sore and it's not allowing me to be active which I need to do for my health. My right heel and it of needing surgery and I think I'm get a needed on this one also   Review of Systems     Objective:   Physical Exam Neurovascular status found to be intact with no significant changes with exquisite discomfort in the plantar fascial left at the insertional point tendon the calcaneus. Digits were found to be well-perfused and warm and no other pathology is noted    Assessment:     Acute plantar fasciitis left it's failed to respond to conservative care    Plan:     I reviewed condition at great length and due to long-standing nature patient would like it to be corrected. I am going to get the results of his vascular studies prior to procedure but he does have good pulses and should heal well and I did allow him to read a consent form today going over all possible complications and all alternative treatments. He understands completely is no guarantee as far success of surgery are healing and the recovery can be approximate 6 months and he signed consent form and is scheduled for outpatient surgery. Patient is encouraged to call us with any questions prior to procedure

## 2016-01-29 ENCOUNTER — Telehealth: Payer: Self-pay | Admitting: *Deleted

## 2016-01-29 NOTE — Telephone Encounter (Signed)
Patient walked in and requested to reschedule his surgery from 02/16/2016 to 03/08/2016.  He stated he has a trip planned to [facility] that he had forgotten about.  Daughter is going to get her Ph D at [employer].  I called and informed Caren Griffins at [facility].

## 2016-02-22 DIAGNOSIS — Z9489 Other transplanted organ and tissue status: Secondary | ICD-10-CM | POA: Diagnosis not present

## 2016-02-22 DIAGNOSIS — E1065 Type 1 diabetes mellitus with hyperglycemia: Secondary | ICD-10-CM | POA: Diagnosis not present

## 2016-02-22 DIAGNOSIS — E559 Vitamin D deficiency, unspecified: Secondary | ICD-10-CM | POA: Diagnosis not present

## 2016-02-25 DIAGNOSIS — Z949 Transplanted organ and tissue status, unspecified: Secondary | ICD-10-CM | POA: Diagnosis not present

## 2016-02-25 DIAGNOSIS — I10 Essential (primary) hypertension: Secondary | ICD-10-CM | POA: Diagnosis not present

## 2016-02-25 DIAGNOSIS — E1065 Type 1 diabetes mellitus with hyperglycemia: Secondary | ICD-10-CM | POA: Diagnosis not present

## 2016-02-25 DIAGNOSIS — E782 Mixed hyperlipidemia: Secondary | ICD-10-CM | POA: Diagnosis not present

## 2016-02-26 ENCOUNTER — Other Ambulatory Visit: Payer: Self-pay

## 2016-03-08 ENCOUNTER — Encounter: Payer: Self-pay | Admitting: Podiatry

## 2016-03-08 DIAGNOSIS — I10 Essential (primary) hypertension: Secondary | ICD-10-CM | POA: Diagnosis not present

## 2016-03-08 DIAGNOSIS — M722 Plantar fascial fibromatosis: Secondary | ICD-10-CM | POA: Diagnosis not present

## 2016-03-09 ENCOUNTER — Telehealth: Payer: Self-pay | Admitting: *Deleted

## 2016-03-09 NOTE — Telephone Encounter (Signed)
Post op courtesy call-Pt states he is doing pretty well, the foot is sore when he's up to go to the bathroom, but he is taking his pain medication every 12 hours, and he doesn't want to take it more than that.  I told pt not to be up on the foot or have it below his heart more than 15 minutes/hour, continue RICE, and leave the surgical boot on at all times especially when walking and sleeping, but may open the boot and rotate the foot around if resting and not going to go to sleep, and to leave the original dressing in place until 1st POV.  Pt states understanding.

## 2016-03-18 ENCOUNTER — Ambulatory Visit (INDEPENDENT_AMBULATORY_CARE_PROVIDER_SITE_OTHER): Payer: Medicare Other | Admitting: Podiatry

## 2016-03-18 ENCOUNTER — Encounter: Payer: Self-pay | Admitting: Podiatry

## 2016-03-18 VITALS — Temp 98.9°F

## 2016-03-18 DIAGNOSIS — M722 Plantar fascial fibromatosis: Secondary | ICD-10-CM | POA: Diagnosis not present

## 2016-03-18 DIAGNOSIS — Z9889 Other specified postprocedural states: Secondary | ICD-10-CM

## 2016-03-18 MED ORDER — HYDROCODONE-ACETAMINOPHEN 5-325 MG PO TABS: 1.0000 | ORAL_TABLET | Freq: Four times a day (QID) | ORAL | Status: DC | PRN

## 2016-03-18 NOTE — Progress Notes (Signed)
Subjective:     Patient ID: Gregory Allen, male   DOB: 27-Aug-1971, 45 y.o.   MRN: [id number]  HPI patient states I'm doing fine with my left foot with moderate pain minimal swelling and I'm able to bear weight on it   Review of Systems     Objective:   Physical Exam Neurovascular status intact muscle strength adequate negative Homans sign noted with wound edges well coapted on the medial lateral side of the left heel    Assessment:     Doing well post endoscopic release medial fascial band left    Plan:     Advised on physical therapy anti-inflammatories and went ahead today and reapplied sterile dressing and dispensed surgical shoe. Reappoint 2 weeks for stitch removal or earlier if needed

## 2016-03-22 DIAGNOSIS — Z9489 Other transplanted organ and tissue status: Secondary | ICD-10-CM | POA: Diagnosis not present

## 2016-04-01 ENCOUNTER — Encounter: Payer: Self-pay | Admitting: Podiatry

## 2016-04-01 ENCOUNTER — Ambulatory Visit (INDEPENDENT_AMBULATORY_CARE_PROVIDER_SITE_OTHER): Payer: Medicare Other | Admitting: Podiatry

## 2016-04-01 VITALS — BP 157/76 | HR 81 | Resp 16

## 2016-04-01 DIAGNOSIS — Z9889 Other specified postprocedural states: Secondary | ICD-10-CM

## 2016-04-01 DIAGNOSIS — M722 Plantar fascial fibromatosis: Secondary | ICD-10-CM | POA: Diagnosis not present

## 2016-04-03 NOTE — Progress Notes (Signed)
Subjective:     Patient ID: Gregory Allen, male   DOB: 25-Apr-1971, 45 y.o.   MRN: [id number]  HPI patient states she's doing well with minimal discomfort and able to walk without swelling   Review of Systems     Objective:   Physical Exam Neurovascular status intact with well-healed surgical sites medial lateral side left heel with wound edges well coapted    Assessment:     Doing well post endoscopic surgery left    Plan:     Gave instructions on physical therapy and remove stitches wound edges well coapted and apply dressings. Continue to be careful in the next few weeks and reappoint

## 2016-04-26 DIAGNOSIS — Z9489 Other transplanted organ and tissue status: Secondary | ICD-10-CM | POA: Diagnosis not present

## 2016-05-25 DIAGNOSIS — Z9489 Other transplanted organ and tissue status: Secondary | ICD-10-CM | POA: Diagnosis not present

## 2016-06-27 DIAGNOSIS — M50322 Other cervical disc degeneration at C5-C6 level: Secondary | ICD-10-CM | POA: Diagnosis not present

## 2016-06-27 DIAGNOSIS — M5136 Other intervertebral disc degeneration, lumbar region: Secondary | ICD-10-CM | POA: Diagnosis not present

## 2016-06-27 DIAGNOSIS — M9901 Segmental and somatic dysfunction of cervical region: Secondary | ICD-10-CM | POA: Diagnosis not present

## 2016-06-27 DIAGNOSIS — M9903 Segmental and somatic dysfunction of lumbar region: Secondary | ICD-10-CM | POA: Diagnosis not present

## 2016-07-04 NOTE — Progress Notes (Signed)
DOS 03/08/2016 Endoscopic release medial band left heel

## 2016-07-14 DIAGNOSIS — Z9489 Other transplanted organ and tissue status: Secondary | ICD-10-CM | POA: Diagnosis not present

## 2016-07-26 DIAGNOSIS — M9901 Segmental and somatic dysfunction of cervical region: Secondary | ICD-10-CM | POA: Diagnosis not present

## 2016-07-26 DIAGNOSIS — M5136 Other intervertebral disc degeneration, lumbar region: Secondary | ICD-10-CM | POA: Diagnosis not present

## 2016-07-26 DIAGNOSIS — M50322 Other cervical disc degeneration at C5-C6 level: Secondary | ICD-10-CM | POA: Diagnosis not present

## 2016-07-26 DIAGNOSIS — M9903 Segmental and somatic dysfunction of lumbar region: Secondary | ICD-10-CM | POA: Diagnosis not present

## 2016-08-19 DIAGNOSIS — Z9489 Other transplanted organ and tissue status: Secondary | ICD-10-CM | POA: Diagnosis not present

## 2016-08-19 DIAGNOSIS — E10649 Type 1 diabetes mellitus with hypoglycemia without coma: Secondary | ICD-10-CM | POA: Diagnosis not present

## 2016-09-26 DIAGNOSIS — M50322 Other cervical disc degeneration at C5-C6 level: Secondary | ICD-10-CM | POA: Diagnosis not present

## 2016-09-26 DIAGNOSIS — M9903 Segmental and somatic dysfunction of lumbar region: Secondary | ICD-10-CM | POA: Diagnosis not present

## 2016-09-26 DIAGNOSIS — M5136 Other intervertebral disc degeneration, lumbar region: Secondary | ICD-10-CM | POA: Diagnosis not present

## 2016-09-26 DIAGNOSIS — M9901 Segmental and somatic dysfunction of cervical region: Secondary | ICD-10-CM | POA: Diagnosis not present

## 2016-10-13 ENCOUNTER — Encounter (HOSPITAL_COMMUNITY): Payer: Self-pay | Admitting: Family Medicine

## 2016-10-13 ENCOUNTER — Ambulatory Visit (HOSPITAL_COMMUNITY)
Admission: EM | Admit: 2016-10-13 | Discharge: 2016-10-13 | Disposition: A | Discharge status: Home / Self Care | Payer: Medicare Other | Attending: Emergency Medicine | Admitting: Emergency Medicine

## 2016-10-13 ENCOUNTER — Ambulatory Visit (INDEPENDENT_AMBULATORY_CARE_PROVIDER_SITE_OTHER): Payer: Medicare Other

## 2016-10-13 DIAGNOSIS — R05 Cough: Secondary | ICD-10-CM

## 2016-10-13 DIAGNOSIS — R042 Hemoptysis: Secondary | ICD-10-CM | POA: Diagnosis not present

## 2016-10-13 DIAGNOSIS — J392 Other diseases of pharynx: Secondary | ICD-10-CM

## 2016-10-13 DIAGNOSIS — R059 Cough, unspecified: Secondary | ICD-10-CM

## 2016-10-13 NOTE — Discharge Instructions (Signed)
Your chest x-ray is clear. Your throat is red and irritated and there is a very small amount bleeding on those soft tissues. This is likely the source and is a minor problem. This will call way once your cough is improved. Recommend taking an antihistamine such as Allegra or Zyrtec to help with drainage as well as Robitussin-DM to help with cough. The sure to drink plenty of cool liquids as this helps to clean the throat and the coolness helps to constrict the blood vessels and limited bleeding.

## 2016-10-13 NOTE — ED Provider Notes (Signed)
CSN: [id number]     Arrival date & time 10/13/16  1032 History   First MD Initiated Contact with Patient 10/13/16 1109     Chief Complaint  Patient presents with  . Hemoptysis   (Consider location/radiation/quality/duration/timing/severity/associated sxs/prior Treatment) 45 year old male states he has had a cold for a week. He states that is not a major concern for him but since he has had a lot of coughing he has had a couple days where upon coughing he has had blood-tinged sputum. His other complaints or sore throat, cough and PND. Denies fever. Denies history of smoking, asthma or COPD.      Past Medical History:  Diagnosis Date  . Diabetes mellitus without complication [facility])    Past Surgical History:  Procedure Laterality Date  . TRANSPLANT PANCREATIC ALLOGRAFT     History reviewed. No pertinent family history. Social History  Substance Use Topics  . Smoking status: Never Smoker  . Smokeless tobacco: Never Used  . Alcohol use No    Review of Systems  Constitutional: Negative for activity change, diaphoresis, fatigue and fever.  HENT: Positive for congestion, postnasal drip and sore throat. Negative for ear pain, facial swelling and trouble swallowing.   Eyes: Negative for pain, discharge and redness.  Respiratory: Positive for cough. Negative for chest tightness and shortness of breath.   Cardiovascular: Negative.   Gastrointestinal: Negative.   Musculoskeletal: Negative.  Negative for neck pain and neck stiffness.  Neurological: Negative.     Allergies  Shellfish-derived products  Home Medications   Prior to Admission medications   Medication Sig Start Date End Date Taking? Authorizing Provider  Ca & Phos-Vit D-Mag 600-280-500-50 TABS Take 1 tablet by mouth. 04/24/12   Historical Provider, MD  carvedilol (COREG) 6.25 MG tablet Take 6.25 mg by mouth.    Historical Provider, MD  Chlorzoxazone (LORZONE) 375 MG TABS Take 1 tablet by mouth.    Historical Provider, MD   DULoxetine (CYMBALTA) 60 MG capsule Take 60 mg by mouth.    Historical Provider, MD  glucagon (GLUCAGON EMERGENCY) 1 MG injection Inject 1 mg into the vein.    Historical Provider, MD  glucose blood (FREESTYLE TEST STRIPS) test strip To be tested 4 times daily 02/14/12   Historical Provider, MD  hydrALAZINE (APRESOLINE) 25 MG tablet Take 25 mg by mouth.    Historical Provider, MD  HYDROcodone-acetaminophen (NORCO/VICODIN) 5-325 MG tablet Take 1 tablet by mouth every 6 (six) hours as needed for moderate pain. 03/18/16   Wallene Huh, DPM  insulin aspart (NOVOLOG) 100 UNIT/ML injection As directed, using insulin pump. 07/18/12   Historical Provider, MD  Insulin Pen Needle (B-D ULTRAFINE III SHORT PEN) 31G X 8 MM MISC 1 each by Does not apply route. 07/18/12   Historical Provider, MD  Insulin Syringe-Needle U-100 (B-D INSULIN SYRINGE 1CC/25GX1") 25G X 1" 1 ML MISC Use as directed 07/19/12   Historical Provider, MD  losartan (COZAAR) 100 MG tablet TAKE 1 TABLET BY MOUTH ONCE DAILY. 11/04/12   Historical Provider, MD  mycophenolate (MYFORTIC) 180 MG EC tablet Take 320 mg by mouth 2 times daily.    Historical Provider, MD  NONFORMULARY OR COMPOUNDED [place] compound:  Peripheral Neuropathy cream - Bupivacaine 1%, doxepin 3%, Gabapentin 6%, Pentoxifylline 3%, Topiramate 1%, dispense 180gm, apply 1-2 grams to affected area 3-4 times daily, +3refills. 12/31/15   Wallene Huh, DPM  pregabalin (LYRICA) 150 MG capsule Take 150 mg by mouth.    Historical Provider, MD  tacrolimus (PROGRAF) 5 MG capsule Take 5 mg by mouth 2 times daily.    Historical Provider, MD  valGANciclovir (VALCYTE) 450 MG tablet TAKE TWO TABLETS BY MOUTH ONCE DAILY 08/24/15   Historical Provider, MD   Meds Ordered and Administered this Visit  Medications - No data to display  BP 129/73 (BP Location: Left Arm)   Pulse 78   Temp 98.1 F (36.7 C) (Oral)   Resp 16   SpO2 99%  No data found.   Physical Exam   Constitutional: He is oriented to person, place, and time. He appears well-developed and well-nourished. No distress.  HENT:  Head: Normocephalic and atraumatic.  Right Ear: External ear normal.  Left Ear: External ear normal.  Oropharynx is injected with patches of erythema and small areas of mucosal disruption which are likely the site of bleeding. A couple of very small 3 head like structures of erythema likely blood from disruption of the mucosal layers associated with cough. No active bleeding is seen. No exudates.  Eyes: EOM are normal. Pupils are equal, round, and reactive to light.  Neck: Normal range of motion. Neck supple.  Cardiovascular: Normal rate, regular rhythm, normal heart sounds and intact distal pulses.   Pulmonary/Chest: Effort normal and breath sounds normal. No respiratory distress. He has no wheezes.  Musculoskeletal: Normal range of motion. He exhibits no edema.  Neurological: He is alert and oriented to person, place, and time.  Skin: Skin is warm and dry. No rash noted.  Nursing note and vitals reviewed.   Urgent Care Course   Clinical Course     Procedures (including critical care time)  Labs Review Labs Reviewed - No data to display  Imaging Review Dg Chest 2 View  Result Date: 10/13/2016 CLINICAL DATA:  Hemoptysis. EXAM: CHEST  2 VIEW COMPARISON:  None. FINDINGS: The heart size and mediastinal contours are within normal limits. Both lungs are clear. No pneumothorax or pleural effusion is noted. The visualized skeletal structures are unremarkable. IMPRESSION: No active cardiopulmonary disease. Electronically Signed   By: Marijo Conception, M.D.   On: 10/13/2016 12:14     Visual Acuity Review  Right Eye Distance:   Left Eye Distance:   Bilateral Distance:    Right Eye Near:   Left Eye Near:    Bilateral Near:         MDM   1. Cough   2. Oropharyngeal bleeding    Your chest x-ray is clear. Your throat is red and irritated and there is a  very small amount bleeding on those soft tissues. This is likely the source and is a minor problem. This will call way once your cough is improved. Recommend taking an antihistamine such as Allegra or Zyrtec to help with drainage as well as Robitussin-DM to help with cough. The sure to drink plenty of cool liquids as this helps to clean the throat and the coolness helps to constrict the blood vessels and limited bleeding.     Janne Napoleon, NP 10/13/16 [phone]

## 2016-10-13 NOTE — ED Triage Notes (Signed)
Pt here for trace blood with coughing and phlegm. sts x 1 week.

## 2016-10-17 DIAGNOSIS — Z9489 Other transplanted organ and tissue status: Secondary | ICD-10-CM | POA: Diagnosis not present

## 2016-11-07 DIAGNOSIS — Z9483 Pancreas transplant status: Secondary | ICD-10-CM

## 2016-11-07 DIAGNOSIS — Z940 Kidney transplant status: Secondary | ICD-10-CM

## 2016-11-07 HISTORY — DX: Kidney transplant status: Z94.0

## 2016-11-07 HISTORY — DX: Pancreas transplant status: Z94.83

## 2016-11-29 DIAGNOSIS — Z949 Transplanted organ and tissue status, unspecified: Secondary | ICD-10-CM | POA: Diagnosis not present

## 2016-11-29 DIAGNOSIS — E1065 Type 1 diabetes mellitus with hyperglycemia: Secondary | ICD-10-CM | POA: Diagnosis not present

## 2016-11-29 DIAGNOSIS — I10 Essential (primary) hypertension: Secondary | ICD-10-CM | POA: Diagnosis not present

## 2016-11-29 DIAGNOSIS — Z9489 Other transplanted organ and tissue status: Secondary | ICD-10-CM | POA: Diagnosis not present

## 2016-12-12 DIAGNOSIS — E108 Type 1 diabetes mellitus with unspecified complications: Secondary | ICD-10-CM | POA: Diagnosis not present

## 2016-12-12 DIAGNOSIS — Z9489 Other transplanted organ and tissue status: Secondary | ICD-10-CM | POA: Diagnosis not present

## 2016-12-12 DIAGNOSIS — J329 Chronic sinusitis, unspecified: Secondary | ICD-10-CM | POA: Diagnosis not present

## 2016-12-12 DIAGNOSIS — Z6832 Body mass index (BMI) 32.0-32.9, adult: Secondary | ICD-10-CM | POA: Diagnosis not present

## 2016-12-12 DIAGNOSIS — I10 Essential (primary) hypertension: Secondary | ICD-10-CM | POA: Diagnosis not present

## 2016-12-12 DIAGNOSIS — G609 Hereditary and idiopathic neuropathy, unspecified: Secondary | ICD-10-CM | POA: Diagnosis not present

## 2017-01-04 DIAGNOSIS — Z9489 Other transplanted organ and tissue status: Secondary | ICD-10-CM | POA: Diagnosis not present

## 2017-01-26 DIAGNOSIS — J019 Acute sinusitis, unspecified: Secondary | ICD-10-CM | POA: Diagnosis not present

## 2017-01-26 DIAGNOSIS — Z6832 Body mass index (BMI) 32.0-32.9, adult: Secondary | ICD-10-CM | POA: Diagnosis not present

## 2017-01-26 DIAGNOSIS — I7389 Other specified peripheral vascular diseases: Secondary | ICD-10-CM | POA: Diagnosis not present

## 2017-01-30 ENCOUNTER — Other Ambulatory Visit: Payer: Self-pay | Admitting: Vascular Surgery

## 2017-01-30 DIAGNOSIS — I70219 Atherosclerosis of native arteries of extremities with intermittent claudication, unspecified extremity: Secondary | ICD-10-CM

## 2017-01-31 ENCOUNTER — Other Ambulatory Visit: Payer: Self-pay

## 2017-02-06 ENCOUNTER — Ambulatory Visit (HOSPITAL_COMMUNITY)
Admission: RE | Admit: 2017-02-06 | Discharge: 2017-02-06 | Disposition: A | Discharge status: Home / Self Care | Payer: Medicare Other | Source: Ambulatory Visit | Attending: Vascular Surgery | Admitting: Vascular Surgery

## 2017-02-06 DIAGNOSIS — I7389 Other specified peripheral vascular diseases: Secondary | ICD-10-CM | POA: Insufficient documentation

## 2017-02-06 DIAGNOSIS — I70219 Atherosclerosis of native arteries of extremities with intermittent claudication, unspecified extremity: Secondary | ICD-10-CM | POA: Diagnosis not present

## 2017-02-09 ENCOUNTER — Encounter: Payer: Self-pay | Admitting: Vascular Surgery

## 2017-02-09 ENCOUNTER — Ambulatory Visit (HOSPITAL_COMMUNITY): Payer: Medicare Other

## 2017-02-13 ENCOUNTER — Encounter (HOSPITAL_COMMUNITY): Payer: Self-pay | Admitting: Emergency Medicine

## 2017-02-13 ENCOUNTER — Emergency Department (HOSPITAL_COMMUNITY)
Admission: EM | Admit: 2017-02-13 | Discharge: 2017-02-13 | Disposition: A | Discharge status: Home / Self Care | Payer: Medicare Other | Attending: Emergency Medicine | Admitting: Emergency Medicine

## 2017-02-13 DIAGNOSIS — I10 Essential (primary) hypertension: Secondary | ICD-10-CM | POA: Insufficient documentation

## 2017-02-13 DIAGNOSIS — E119 Type 2 diabetes mellitus without complications: Secondary | ICD-10-CM | POA: Insufficient documentation

## 2017-02-13 DIAGNOSIS — R197 Diarrhea, unspecified: Secondary | ICD-10-CM

## 2017-02-13 DIAGNOSIS — R112 Nausea with vomiting, unspecified: Secondary | ICD-10-CM

## 2017-02-13 DIAGNOSIS — Z79899 Other long term (current) drug therapy: Secondary | ICD-10-CM | POA: Insufficient documentation

## 2017-02-13 DIAGNOSIS — Z794 Long term (current) use of insulin: Secondary | ICD-10-CM | POA: Insufficient documentation

## 2017-02-13 DIAGNOSIS — K529 Noninfective gastroenteritis and colitis, unspecified: Secondary | ICD-10-CM

## 2017-02-13 DIAGNOSIS — R109 Unspecified abdominal pain: Secondary | ICD-10-CM | POA: Diagnosis present

## 2017-02-13 LAB — CBC
HEMATOCRIT: 45.1 % (ref 39.0–52.0)
Hemoglobin: 14.3 g/dL (ref 13.0–17.0)
MCH: 25 pg — AB (ref 26.0–34.0)
MCHC: 31.7 g/dL (ref 30.0–36.0)
MCV: 78.7 fL (ref 78.0–100.0)
Platelets: 239 10*3/uL (ref 150–400)
RBC: 5.73 MIL/uL (ref 4.22–5.81)
RDW: 13.9 % (ref 11.5–15.5)
WBC: 6.3 10*3/uL (ref 4.0–10.5)

## 2017-02-13 LAB — CBG MONITORING, ED
Glucose-Capillary: 88 mg/dL (ref 65–99)
Glucose-Capillary: 120 mg/dL — ABNORMAL HIGH (ref 65–99)

## 2017-02-13 LAB — URINALYSIS, ROUTINE W REFLEX MICROSCOPIC
BACTERIA UA: NONE SEEN
BILIRUBIN URINE: NEGATIVE
Glucose, UA: 150 mg/dL — AB
Hgb urine dipstick: NEGATIVE
KETONES UR: NEGATIVE mg/dL
LEUKOCYTES UA: NEGATIVE
Nitrite: NEGATIVE
Protein, ur: 100 mg/dL — AB
SQUAMOUS EPITHELIAL / LPF: NONE SEEN
Specific Gravity, Urine: 1.013 (ref 1.005–1.030)
pH: 6 (ref 5.0–8.0)

## 2017-02-13 LAB — COMPREHENSIVE METABOLIC PANEL
ALBUMIN: 3.9 g/dL (ref 3.5–5.0)
ALK PHOS: 74 U/L (ref 38–126)
ALT: 16 U/L — ABNORMAL LOW (ref 17–63)
ANION GAP: 12 (ref 5–15)
AST: 16 U/L (ref 15–41)
BILIRUBIN TOTAL: 0.6 mg/dL (ref 0.3–1.2)
BUN: 17 mg/dL (ref 6–20)
CO2: 23 mmol/L (ref 22–32)
Calcium: 9.5 mg/dL (ref 8.9–10.3)
Chloride: 106 mmol/L (ref 101–111)
Creatinine, Ser: 1.42 mg/dL — ABNORMAL HIGH (ref 0.61–1.24)
GFR calc Af Amer: >60 mL/min (ref >60)
GFR calc non Af Amer: 58 mL/min — ABNORMAL LOW (ref >60)
GLUCOSE: 104 mg/dL — AB (ref 65–99)
Potassium: 3.9 mmol/L (ref 3.5–5.1)
Sodium: 141 mmol/L (ref 135–145)
TOTAL PROTEIN: 7 g/dL (ref 6.5–8.1)

## 2017-02-13 LAB — LIPASE, BLOOD: Lipase: <10 U/L — ABNORMAL LOW (ref 11–51)

## 2017-02-13 MED ORDER — HYDROMORPHONE HCL 1 MG/ML IJ SOLN
1.0000 mg | Freq: Once | INTRAMUSCULAR | Status: AC
  Administered 2017-02-13: 1 mg via INTRAMUSCULAR
  Filled 2017-02-13: qty 1

## 2017-02-13 MED ORDER — ONDANSETRON HCL 4 MG/2ML IJ SOLN
4.0000 mg | Freq: Once | INTRAMUSCULAR | Status: AC
  Administered 2017-02-13: 4 mg via INTRAVENOUS
  Filled 2017-02-13: qty 2

## 2017-02-13 MED ORDER — FAMOTIDINE IN NACL 20-0.9 MG/50ML-% IV SOLN
20.0000 mg | Freq: Once | INTRAVENOUS | Status: AC
  Administered 2017-02-13: 20 mg via INTRAVENOUS
  Filled 2017-02-13: qty 50

## 2017-02-13 MED ORDER — ONDANSETRON 8 MG PO TBDP: 8.0000 mg | ORAL_TABLET | Freq: Three times a day (TID) | ORAL | 0 refills | Status: DC | PRN

## 2017-02-13 MED ORDER — METOCLOPRAMIDE HCL 5 MG/ML IJ SOLN
10.0000 mg | Freq: Once | INTRAMUSCULAR | Status: AC
  Administered 2017-02-13: 10 mg via INTRAVENOUS
  Filled 2017-02-13: qty 2

## 2017-02-13 MED ORDER — SODIUM CHLORIDE 0.9 % IV BOLUS (SEPSIS)
1000.0000 mL | Freq: Once | INTRAVENOUS | Status: AC
  Administered 2017-02-13: 1000 mL via INTRAVENOUS

## 2017-02-13 NOTE — ED Notes (Signed)
Pt aware of need for urine  

## 2017-02-13 NOTE — ED Notes (Signed)
Pt did not need anything at this time  

## 2017-02-13 NOTE — ED Provider Notes (Signed)
[name] DEPT Provider Note   CSN: [id number] Arrival date & time: 02/13/17  5701     History   Chief Complaint Chief Complaint  Patient presents with  . Emesis  . Abdominal Pain    HPI Gregory Allen is a 46 y.o. male.  Patient c/o nausea and vomiting for the past day, several episodes, no bloody or bilious emesis. Also a couple episodes diarrhea. Intermittent abd cramping, no constant/focal pain. Moderate. Denies back or flank pain. No fever or chills. No known bad food ingestion or ill contacts. Hx islet cell transplant 3 years ago. Compliant w meds.    The history is provided by the patient.  Emesis   Associated symptoms include abdominal pain and diarrhea. Pertinent negatives include no fever and no headaches.  Abdominal Pain   Associated symptoms include diarrhea and vomiting. Pertinent negatives include fever, dysuria and headaches.    Past Medical History:  Diagnosis Date  . Diabetes mellitus without complication [facility])     Patient Active Problem List   Diagnosis Date Noted  . BP (high blood pressure) 11/19/2015  . History of organ or tissue transplant 05/12/2015  . Essential (primary) hypertension 05/12/2015  . Combined fat and carbohydrate induced hyperlipemia 05/12/2015  . Type 1 diabetes mellitus with hyperglycemia ([place]) 05/12/2015  . Arthritis of hand, degenerative 09/08/2014  . Hypophosphatemia 04/24/2012  . Carpal bone fracture 01/17/2012  . Tarsal tunnel syndrome 01/17/2012  . Nonproliferative diabetic retinopathy ([place]) 02/08/2011  . Type 1 diabetes mellitus ([place]) 02/08/2011  . Diabetic polyneuropathy ([place]) 02/08/2011  . Abnormal presence of protein in urine 02/08/2011    Past Surgical History:  Procedure Laterality Date  . TRANSPLANT PANCREATIC ALLOGRAFT         Home Medications    Prior to Admission medications   Medication Sig Start Date End Date Taking? Authorizing Provider  Ca & Phos-Vit D-Mag 600-280-500-50 TABS Take 1 tablet  by mouth. 04/24/12   Historical Provider, MD  carvedilol (COREG) 6.25 MG tablet Take 6.25 mg by mouth.    Historical Provider, MD  Chlorzoxazone (LORZONE) 375 MG TABS Take 1 tablet by mouth.    Historical Provider, MD  DULoxetine (CYMBALTA) 60 MG capsule Take 60 mg by mouth.    Historical Provider, MD  glucagon (GLUCAGON EMERGENCY) 1 MG injection Inject 1 mg into the vein.    Historical Provider, MD  glucose blood (FREESTYLE TEST STRIPS) test strip To be tested 4 times daily 02/14/12   Historical Provider, MD  hydrALAZINE (APRESOLINE) 25 MG tablet Take 25 mg by mouth.    Historical Provider, MD  HYDROcodone-acetaminophen (NORCO/VICODIN) 5-325 MG tablet Take 1 tablet by mouth every 6 (six) hours as needed for moderate pain. 03/18/16   Wallene Huh, DPM  insulin aspart (NOVOLOG) 100 UNIT/ML injection As directed, using insulin pump. 07/18/12   Historical Provider, MD  Insulin Pen Needle (B-D ULTRAFINE III SHORT PEN) 31G X 8 MM MISC 1 each by Does not apply route. 07/18/12   Historical Provider, MD  Insulin Syringe-Needle U-100 (B-D INSULIN SYRINGE 1CC/25GX1") 25G X 1" 1 ML MISC Use as directed 07/19/12   Historical Provider, MD  losartan (COZAAR) 100 MG tablet TAKE 1 TABLET BY MOUTH ONCE DAILY. 11/04/12   Historical Provider, MD  mycophenolate (MYFORTIC) 180 MG EC tablet Take 320 mg by mouth 2 times daily.    Historical Provider, MD  NONFORMULARY OR COMPOUNDED [place] compound:  Peripheral Neuropathy cream - Bupivacaine 1%, doxepin 3%, Gabapentin 6%, Pentoxifylline 3%, Topiramate  1%, dispense 180gm, apply 1-2 grams to affected area 3-4 times daily, +3refills. 12/31/15   Wallene Huh, DPM  pregabalin (LYRICA) 150 MG capsule Take 150 mg by mouth.    Historical Provider, MD  tacrolimus (PROGRAF) 5 MG capsule Take 5 mg by mouth 2 times daily.    Historical Provider, MD  valGANciclovir (VALCYTE) 450 MG tablet TAKE TWO TABLETS BY MOUTH ONCE DAILY 08/24/15   Historical Provider, MD    Family  History History reviewed. No pertinent family history.  Social History Social History  Substance Use Topics  . Smoking status: Never Smoker  . Smokeless tobacco: Never Used  . Alcohol use No     Allergies   Shellfish-derived products   Review of Systems Review of Systems  Constitutional: Negative for fever.  HENT: Negative for sore throat.   Eyes: Negative for redness.  Respiratory: Negative for shortness of breath.   Cardiovascular: Negative for chest pain.  Gastrointestinal: Positive for abdominal pain, diarrhea and vomiting.  Genitourinary: Negative for dysuria and flank pain.  Musculoskeletal: Negative for back pain and neck pain.  Skin: Negative for rash.  Neurological: Negative for headaches.  Hematological: Does not bruise/bleed easily.  Psychiatric/Behavioral: Negative for confusion.     Physical Exam Updated Vital Signs BP (!) 154/94 (BP Location: Left Arm)   Pulse 75   Temp 97.4 F (36.3 C) (Oral)   Resp 12   SpO2 100%   Physical Exam  Constitutional: He appears well-developed and well-nourished. No distress.  HENT:  Mouth/Throat: Oropharynx is clear and moist.  Eyes: Conjunctivae are normal.  Neck: Neck supple. No tracheal deviation present.  Cardiovascular: Normal rate, regular rhythm, normal heart sounds and intact distal pulses.   Pulmonary/Chest: Effort normal and breath sounds normal. No accessory muscle usage. No respiratory distress.  Abdominal: Soft. Bowel sounds are normal. He exhibits no distension. There is no tenderness.  Genitourinary:  Genitourinary Comments: No cva tenderness  Musculoskeletal: He exhibits no edema.  Neurological: He is alert.  Skin: Skin is warm and dry. He is not diaphoretic.  Psychiatric: He has a normal mood and affect.  Nursing note and vitals reviewed.    ED Treatments / Results  Labs (all labs ordered are listed, but only abnormal results are displayed) Results for orders placed or performed during the  hospital encounter of 02/13/17  Lipase, blood  Result Value Ref Range   Lipase <10 (L) 11 - 51 U/L  Comprehensive metabolic panel  Result Value Ref Range   Sodium 141 135 - 145 mmol/L   Potassium 3.9 3.5 - 5.1 mmol/L   Chloride 106 101 - 111 mmol/L   CO2 23 22 - 32 mmol/L   Glucose, Bld 104 (H) 65 - 99 mg/dL   BUN 17 6 - 20 mg/dL   Creatinine, Ser 1.42 (H) 0.61 - 1.24 mg/dL   Calcium 9.5 8.9 - 10.3 mg/dL   Total Protein 7.0 6.5 - 8.1 g/dL   Albumin 3.9 3.5 - 5.0 g/dL   AST 16 15 - 41 U/L   ALT 16 (L) 17 - 63 U/L   Alkaline Phosphatase 74 38 - 126 U/L   Total Bilirubin 0.6 0.3 - 1.2 mg/dL   GFR calc non Af Amer 58 (L) >60 mL/min   GFR calc Af Amer >60 >60 mL/min   Anion gap 12 5 - 15  CBC  Result Value Ref Range   WBC 6.3 4.0 - 10.5 K/uL   RBC 5.73 4.22 - 5.81 MIL/uL  Hemoglobin 14.3 13.0 - 17.0 g/dL   HCT 45.1 39.0 - 52.0 %   MCV 78.7 78.0 - 100.0 fL   MCH 25.0 (L) 26.0 - 34.0 pg   MCHC 31.7 30.0 - 36.0 g/dL   RDW 13.9 11.5 - 15.5 %   Platelets 239 150 - 400 K/uL  CBG monitoring, ED  Result Value Ref Range   Glucose-Capillary 88 65 - 99 mg/dL  CBG monitoring, ED  Result Value Ref Range   Glucose-Capillary 120 (H) 65 - 99 mg/dL   EKG  EKG Interpretation None       Radiology No results found.  Procedures Procedures (including critical care time)  Medications Ordered in ED Medications  HYDROmorphone (DILAUDID) injection 1 mg (not administered)  famotidine (PEPCID) IVPB 20 mg premix (not administered)  ondansetron (ZOFRAN) injection 4 mg (not administered)  sodium chloride 0.9 % bolus 1,000 mL (not administered)     Initial Impression / Assessment and Plan / ED Course  I have reviewed the triage vital signs and the nursing notes.  Pertinent labs & imaging results that were available during my care of the patient were reviewed by me and considered in my medical decision making (see chart for details).  Iv ns bolus. zofran iv. pepcid iv. Dilaudid 1 mg  iv.  Labs sent.  Reviewed nursing notes and prior charts for additional history.   Recurrent episode emesis.  reglan iv.  Additional ns iv.   Recheck feels improved.  Recheck, tolerating po. abd soft nt.  Patient appears stable for d/c.     Final Clinical Impressions(s) / ED Diagnoses   Final diagnoses:  None    New Prescriptions New Prescriptions   No medications on file     Lajean Saver, MD 02/13/17 1025

## 2017-02-13 NOTE — ED Notes (Signed)
PT did not have to use RR at this time  

## 2017-02-13 NOTE — ED Triage Notes (Signed)
Pt states he has been vomiting for 24 hours and having diarrhea. Pt has been diaphoretic. 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 Pt vomiting in triage.

## 2017-02-13 NOTE — Discharge Instructions (Signed)
It was our pleasure to provide your ER care today - we hope that you feel better.  Rest. Drink plenty of fluids.  Take zofran as need for nausea.   Follow up with primary care doctor in the next 1-2 days if symptoms fail to improve/resolve.  Your blood pressure is high today -  continue your blood pressure medication and follow up with primary care doctor in the coming week.  Return to ER if worse, persistent vomiting, severe abdominal pain, other concern.   You were given pain medication in the ER - no driving for the next 4 hours.

## 2017-02-17 ENCOUNTER — Encounter: Payer: Self-pay | Admitting: Vascular Surgery

## 2017-02-17 ENCOUNTER — Ambulatory Visit (INDEPENDENT_AMBULATORY_CARE_PROVIDER_SITE_OTHER): Payer: Medicare Other | Admitting: Vascular Surgery

## 2017-02-17 VITALS — BP 141/97 | HR 86 | Temp 99.0°F | Resp 16 | Ht 70.0 in | Wt 200.0 lb

## 2017-02-17 DIAGNOSIS — E108 Type 1 diabetes mellitus with unspecified complications: Secondary | ICD-10-CM

## 2017-02-17 DIAGNOSIS — IMO0002 Reserved for concepts with insufficient information to code with codable children: Secondary | ICD-10-CM

## 2017-02-17 DIAGNOSIS — I70219 Atherosclerosis of native arteries of extremities with intermittent claudication, unspecified extremity: Secondary | ICD-10-CM | POA: Diagnosis not present

## 2017-02-17 DIAGNOSIS — E1065 Type 1 diabetes mellitus with hyperglycemia: Secondary | ICD-10-CM

## 2017-02-17 MED ORDER — CILOSTAZOL 100 MG PO TABS: 100.0000 mg | ORAL_TABLET | Freq: Two times a day (BID) | ORAL | 11 refills | Status: DC

## 2017-02-17 NOTE — Progress Notes (Signed)
Vitals:   02/17/17 1439  BP: (!) 157/95  Pulse: 86  Resp: 16  Temp: 99 F (37.2 C)  SpO2: 97%  Weight: 200 lb (90.7 kg)  Height: 5\' 10"  (1.778 m)

## 2017-02-17 NOTE — Progress Notes (Signed)
Patient ID: Gregory Allen, male   DOB: 13-Aug-1971, 46 y.o.   MRN: [id number]  Reason for Consult: New Evaluation (bilateral leg pain and cramping)   Referred by Velna Hatchet, MD  Subjective:     HPI:  Gregory Allen is a 46 y.o. male presents as new patient for bilateral lower extremity burning in his calf with associated cramping. The left is worse than the right. He states that it does occasionally occur at rest but mostly after walking. He states sometimes he walks a few feet before happens but he can continue to walk. In particular he is able to go to the gym and walking at least a mile on the treadmill. From an upper body standpoint he is very healthy and lifts weights without issue. He has never had a stroke or cardiac issues. 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 He is currently on the pancreas transplant list and wait for this. He has associated leg swelling occasionally and it and left lateral thigh pain. He does not have rest pain tissue loss or ulceration. He does take daily aspirin.  Past Medical History:  Diagnosis Date  . Diabetes mellitus without complication ([place])    No family history on file. Past Surgical History:  Procedure Laterality Date  . TRANSPLANT PANCREATIC ALLOGRAFT      Short Social History:  Social History  Substance Use Topics  . Smoking status: Never Smoker  . Smokeless tobacco: Never Used  . Alcohol use No    Allergies  Allergen Reactions  . Shellfish-Derived Products Itching    "throat starts itching" pt states "he is ok with iodine"    Current Outpatient Prescriptions  Medication Sig Dispense Refill  . carvedilol (COREG) 12.5 MG tablet Take 12.5 mg by mouth 2 (two) times daily with a meal.    . glucagon (GLUCAGON EMERGENCY) 1 MG injection Inject 1 mg into the vein once as needed.     Marland Kitchen glucose blood (FREESTYLE TEST STRIPS) test strip To be tested 4 times daily    . hydrALAZINE (APRESOLINE)  25 MG tablet Take 25 mg by mouth.    Marland Kitchen HYDROcodone-acetaminophen (NORCO/VICODIN) 5-325 MG tablet Take 1 tablet by mouth every 6 (six) hours as needed for moderate pain. 30 tablet 0  . insulin aspart (NOVOLOG) 100 UNIT/ML injection As directed, using insulin pump. 1:30 carb ratio    . insulin detemir (LEVEMIR) 100 UNIT/ML injection Inject 18 Units into the skin at bedtime.    . Insulin Pen Needle (B-D ULTRAFINE III SHORT PEN) 31G X 8 MM MISC 1 each by Does not apply route.    . Insulin Syringe-Needle U-100 (B-D INSULIN SYRINGE 1CC/25GX1") 25G X 1" 1 ML MISC Use as directed    . linaclotide (LINZESS) 145 MCG CAPS capsule Take 145 mcg by mouth daily as needed.    . mycophenolate (MYFORTIC) 360 MG TBEC EC tablet Take 360 mg by mouth 2 (two) times daily.    . NONFORMULARY OR COMPOUNDED [place] compound:  Peripheral Neuropathy cream - Bupivacaine 1%, doxepin 3%, Gabapentin 6%, Pentoxifylline 3%, Topiramate 1%, dispense 180gm, apply 1-2 grams to affected area 3-4 times daily, +3refills. 180 each 3  . ondansetron (ZOFRAN ODT) 8 MG disintegrating tablet Take 1 tablet (8 mg total) by mouth every 8 (eight) hours as needed for nausea or vomiting. 10 tablet 0  . tacrolimus (PROGRAF) 0.5 MG capsule Take 0.5 mg by mouth 2 (two) times daily. Takes with  4mg  = 4.5mg     . tacrolimus (PROGRAF) 1 MG capsule Take 4 mg by mouth 2 (two) times daily. Takes with 0.5mg  = 4.5mg      . cilostazol (PLETAL) 100 MG tablet Take 1 tablet (100 mg total) by mouth 2 (two) times daily before a meal. 60 tablet 11   No current facility-administered medications for this visit.     Review of Systems  Constitutional:  Constitutional negative. HENT: HENT negative.  Eyes: Eyes negative.  Cardiovascular: Positive for claudication and leg swelling.  GI: Gastrointestinal negative.  Musculoskeletal: Positive for leg pain.  Skin: Skin negative.  Neurological: Neurological negative. Hematologic: Hematologic/lymphatic negative.   Psychiatric: Psychiatric negative.        Objective:  Objective   Vitals:   02/17/17 1439 02/17/17 1443  BP: (!) 157/95 (!) 141/97  Pulse: 86 86  Resp: 16   Temp: 99 F (37.2 C)   SpO2: 97%   Weight: 200 lb (90.7 kg)   Height: 5\' 10"  (1.778 m)    Body mass index is 28.7 kg/m.  Physical Exam  Constitutional: He appears well-developed.  HENT:  Head: Normocephalic.  Eyes: Pupils are equal, round, and reactive to light.  Neck: Normal range of motion.  Cardiovascular: Normal rate.   Pulses:      Femoral pulses are 2+ on the right side, and 2+ on the left side. Strong peroneal signals bilaterally, monophasic dp/pt bilateral  Pulmonary/Chest: Effort normal.  Abdominal: Soft. He exhibits no mass.  Musculoskeletal: Normal range of motion. He exhibits no edema or deformity.  Lymphadenopathy:    He has no cervical adenopathy.  Neurological: He is alert.  Skin: Skin is warm and dry.  Psychiatric: He has a normal mood and affect. His behavior is normal. Thought content normal.    Data:      Assessment/Plan:     46 year old long-time diabetic with calf cramping with any activity but is able to walk at least a mile on a treadmill does not have rest pain or tissue loss at this time does not appear to have life limitation from his calf cramping. Also appears that his pain is multifactorial nature in his bilateral lower extremities from possible back pain given the hip component as well. He does have near normal ABIs although monophasic signals from likely calcified vessels from his long-term diabetes. We discussed intervention versus conservative management with addition of Pletal which is the route he wants to go at this time. We will have him follow up in 6 months with repeat ABI with toe pressures. He is currently on the pictures transplant waiting list and is okay from a vascular standpoint to undergo procedures.      Waynetta Sandy MD [facility] [place]

## 2017-02-20 NOTE — Addendum Note (Signed)
Addended by: Lianne Cure A on: 02/20/2017 11:44 AM   Modules accepted: Orders

## 2017-03-02 DIAGNOSIS — Z9489 Other transplanted organ and tissue status: Secondary | ICD-10-CM | POA: Diagnosis not present

## 2017-03-02 DIAGNOSIS — H8110 Benign paroxysmal vertigo, unspecified ear: Secondary | ICD-10-CM | POA: Diagnosis not present

## 2017-03-02 DIAGNOSIS — E109 Type 1 diabetes mellitus without complications: Secondary | ICD-10-CM | POA: Diagnosis not present

## 2017-03-02 DIAGNOSIS — R42 Dizziness and giddiness: Secondary | ICD-10-CM | POA: Diagnosis not present

## 2017-03-02 DIAGNOSIS — Z6831 Body mass index (BMI) 31.0-31.9, adult: Secondary | ICD-10-CM | POA: Diagnosis not present

## 2017-03-08 DIAGNOSIS — I10 Essential (primary) hypertension: Secondary | ICD-10-CM | POA: Diagnosis not present

## 2017-03-08 DIAGNOSIS — I7389 Other specified peripheral vascular diseases: Secondary | ICD-10-CM | POA: Diagnosis not present

## 2017-03-08 DIAGNOSIS — Z125 Encounter for screening for malignant neoplasm of prostate: Secondary | ICD-10-CM | POA: Diagnosis not present

## 2017-03-08 DIAGNOSIS — E109 Type 1 diabetes mellitus without complications: Secondary | ICD-10-CM | POA: Diagnosis not present

## 2017-03-15 DIAGNOSIS — E1121 Type 2 diabetes mellitus with diabetic nephropathy: Secondary | ICD-10-CM | POA: Diagnosis not present

## 2017-03-15 DIAGNOSIS — Z0000 Encounter for general adult medical examination without abnormal findings: Secondary | ICD-10-CM | POA: Diagnosis not present

## 2017-03-15 DIAGNOSIS — M25511 Pain in right shoulder: Secondary | ICD-10-CM | POA: Diagnosis not present

## 2017-03-15 DIAGNOSIS — Z23 Encounter for immunization: Secondary | ICD-10-CM | POA: Diagnosis not present

## 2017-03-15 DIAGNOSIS — I10 Essential (primary) hypertension: Secondary | ICD-10-CM | POA: Diagnosis not present

## 2017-03-15 DIAGNOSIS — Z1389 Encounter for screening for other disorder: Secondary | ICD-10-CM | POA: Diagnosis not present

## 2017-03-15 DIAGNOSIS — Z6830 Body mass index (BMI) 30.0-30.9, adult: Secondary | ICD-10-CM | POA: Diagnosis not present

## 2017-03-15 DIAGNOSIS — E1021 Type 1 diabetes mellitus with diabetic nephropathy: Secondary | ICD-10-CM | POA: Diagnosis not present

## 2017-03-15 DIAGNOSIS — M25512 Pain in left shoulder: Secondary | ICD-10-CM | POA: Diagnosis not present

## 2017-03-22 ENCOUNTER — Encounter (HOSPITAL_COMMUNITY): Payer: Medicare Other

## 2017-03-22 ENCOUNTER — Encounter: Payer: Medicare Other | Admitting: Vascular Surgery

## 2017-03-30 ENCOUNTER — Ambulatory Visit (INDEPENDENT_AMBULATORY_CARE_PROVIDER_SITE_OTHER): Payer: Medicare Other

## 2017-03-30 ENCOUNTER — Encounter (INDEPENDENT_AMBULATORY_CARE_PROVIDER_SITE_OTHER): Payer: Self-pay

## 2017-03-30 ENCOUNTER — Ambulatory Visit (INDEPENDENT_AMBULATORY_CARE_PROVIDER_SITE_OTHER): Payer: 59 | Admitting: Orthopedic Surgery

## 2017-03-30 ENCOUNTER — Encounter (INDEPENDENT_AMBULATORY_CARE_PROVIDER_SITE_OTHER): Payer: Self-pay | Admitting: Orthopedic Surgery

## 2017-03-30 ENCOUNTER — Ambulatory Visit (INDEPENDENT_AMBULATORY_CARE_PROVIDER_SITE_OTHER): Payer: 59

## 2017-03-30 DIAGNOSIS — I70219 Atherosclerosis of native arteries of extremities with intermittent claudication, unspecified extremity: Secondary | ICD-10-CM | POA: Diagnosis not present

## 2017-03-30 DIAGNOSIS — M25512 Pain in left shoulder: Secondary | ICD-10-CM

## 2017-03-30 DIAGNOSIS — G8929 Other chronic pain: Secondary | ICD-10-CM | POA: Diagnosis not present

## 2017-03-30 DIAGNOSIS — M25511 Pain in right shoulder: Secondary | ICD-10-CM | POA: Diagnosis not present

## 2017-03-30 NOTE — Progress Notes (Signed)
Office Visit Note   Patient: Gregory Allen           Date of Birth: 07-04-1971           MRN: [id number] Visit Date: 03/30/2017 Requested by: Velna Hatchet, MD [address] [place], [place] [address] PCP: Velna Hatchet, MD  Subjective: Chief Complaint  Patient presents with  . Left Shoulder - Pain  . Right Shoulder - Pain    HPI: Patient is a 73 show patient with bilateral shoulder pain right worse than left.  Been going on for 6 months and getting worse.  Denies any history of injury.  He is right-hand dominant.  Pain is constant in the right shoulder.  Wakes him with throbbing pain.  He does report some numbness and tingling in the hands and he stated that he was previously diagnosed with neuropathy in his hands.  Reports decreased range of motion to pain.  Reports the shoulders feel stiff.  He is not taking anything currently.  He is not on any steroids.  He states that the right shoulder requires stretching for her to stop hurting.  He has a dull aching pain in the left shoulder which is there all the time.  He denies any mechanical symptoms or weakness.  He is to lift weights to a significant degree but now it is less.  Now he is only doing calisthenics.              ROS: All systems reviewed are negative as they relate to the chief complaint within the history of present illness.  Patient denies  fevers or chills.   Assessment & Plan: Visit Diagnoses:  1. Chronic pain of both shoulders     Plan: Impression is bilateral shoulder pain right worse than left with normal x-rays and normal exam other than some relative restriction of external rotation bilaterally.  This doesn't look like frozen shoulder but I think he may have occult arthritis which is worse on the right than the left.  He has not taken any steroids so avascular necrosis is less likely.  Rotator cuff strength is intact.  Plan is for MRI scan to evaluate for occult arthritis versus other labral/biceps tendon  problem.  I'll see him back after that study.  Follow-Up Instructions: No Follow-up on file.   Orders:  Orders Placed This Encounter  Procedures  . XR Shoulder Right  . XR Shoulder Left   No orders of the defined types were placed in this encounter.     Procedures: No procedures performed   Clinical Data: No additional findings.  Objective: Vital Signs: There were no vitals taken for this visit.  Physical Exam:   Constitutional: Patient appears well-developed HEENT:  Head: Normocephalic Eyes:EOM are normal Neck: Normal range of motion Cardiovascular: Normal rate Pulmonary/chest: Effort normal Neurologic: Patient is alert Skin: Skin is warm Psychiatric: Patient has normal mood and affect    Ortho Exam: Orthopedic exam demonstrates a very fit-appearing individual with large volume of upper extremity muscle mass.  Neck range of motion is full.  Reflexes symmetric bilateral biceps and triceps.  Radial pulses intact bilaterally.  External rotation passively bilaterally is about 20 which the patient states his the weights been for many years.  He does have for flexion and abduction above 90.  Rotator cuff strength bilaterally is excellent interspace super space is upset muscle testing.  No acromioclavicular joint tenderness to direct palpation bilaterally.  No other masses lymph and after skin changes noted in  the right shoulder region.  Specialty Comments:  No specialty comments available.  Imaging: Xr Shoulder Left  Result Date: 03/30/2017 AP lateral axillary left shoulder reviewed.  The visualized lung fields clear.  No degenerative changes in the acromioclavicular joint.  No degenerative changes at the glenohumeral joint.  Shoulder is located.  No other soft tissue calcifications fracture or joint abnormalities noted.  Xr Shoulder Right  Result Date: 03/30/2017 The AP lateral oblique right shoulder reviewed.  Visualized lung fields clear.  No glenohumeral arthritis  is present.  Acromioclavicular joint normal.  Shoulder is located.  No evidence of fracture or trauma or arthritis.  Type II acromion is present.    PMFS History: Patient Active Problem List   Diagnosis Date Noted  . BP (high blood pressure) 11/19/2015  . History of organ or tissue transplant 05/12/2015  . Essential (primary) hypertension 05/12/2015  . Combined fat and carbohydrate induced hyperlipemia 05/12/2015  . Type 1 diabetes mellitus with hyperglycemia ([place]) 05/12/2015  . Arthritis of hand, degenerative 09/08/2014  . Hypophosphatemia 04/24/2012  . Carpal bone fracture 01/17/2012  . Tarsal tunnel syndrome 01/17/2012  . Nonproliferative diabetic retinopathy ([place]) 02/08/2011  . Type 1 diabetes mellitus ([place]) 02/08/2011  . Diabetic polyneuropathy ([place]) 02/08/2011  . Abnormal presence of protein in urine 02/08/2011   Past Medical History:  Diagnosis Date  . Diabetes mellitus without complication ([place])     No family history on file.  Past Surgical History:  Procedure Laterality Date  . TRANSPLANT PANCREATIC ALLOGRAFT     Social History   Occupational History  . Not on file.   Social History Main Topics  . Smoking status: Never Smoker  . Smokeless tobacco: Never Used  . Alcohol use No  . Drug use: Unknown  . Sexual activity: Not on file

## 2017-04-06 DIAGNOSIS — K3184 Gastroparesis: Secondary | ICD-10-CM | POA: Diagnosis present

## 2017-04-06 DIAGNOSIS — Z4822 Encounter for aftercare following kidney transplant: Secondary | ICD-10-CM | POA: Diagnosis not present

## 2017-04-06 DIAGNOSIS — Z48288 Encounter for aftercare following multiple organ transplant: Secondary | ICD-10-CM | POA: Diagnosis not present

## 2017-04-06 DIAGNOSIS — Z01818 Encounter for other preprocedural examination: Secondary | ICD-10-CM | POA: Diagnosis not present

## 2017-04-06 DIAGNOSIS — I1310 Hypertensive heart and chronic kidney disease without heart failure, with stage 1 through stage 4 chronic kidney disease, or unspecified chronic kidney disease: Secondary | ICD-10-CM | POA: Diagnosis not present

## 2017-04-06 DIAGNOSIS — E871 Hypo-osmolality and hyponatremia: Secondary | ICD-10-CM | POA: Diagnosis not present

## 2017-04-06 DIAGNOSIS — E1051 Type 1 diabetes mellitus with diabetic peripheral angiopathy without gangrene: Secondary | ICD-10-CM | POA: Diagnosis not present

## 2017-04-06 DIAGNOSIS — Z9483 Pancreas transplant status: Secondary | ICD-10-CM | POA: Diagnosis not present

## 2017-04-06 DIAGNOSIS — E785 Hyperlipidemia, unspecified: Secondary | ICD-10-CM | POA: Diagnosis not present

## 2017-04-06 DIAGNOSIS — Z452 Encounter for adjustment and management of vascular access device: Secondary | ICD-10-CM | POA: Diagnosis not present

## 2017-04-06 DIAGNOSIS — Z5181 Encounter for therapeutic drug level monitoring: Secondary | ICD-10-CM | POA: Diagnosis not present

## 2017-04-06 DIAGNOSIS — Z4682 Encounter for fitting and adjustment of non-vascular catheter: Secondary | ICD-10-CM | POA: Diagnosis not present

## 2017-04-06 DIAGNOSIS — E1065 Type 1 diabetes mellitus with hyperglycemia: Secondary | ICD-10-CM | POA: Diagnosis not present

## 2017-04-06 DIAGNOSIS — T86890 Other transplanted tissue rejection: Secondary | ICD-10-CM | POA: Diagnosis present

## 2017-04-06 DIAGNOSIS — I10 Essential (primary) hypertension: Secondary | ICD-10-CM | POA: Diagnosis present

## 2017-04-06 DIAGNOSIS — D631 Anemia in chronic kidney disease: Secondary | ICD-10-CM | POA: Diagnosis not present

## 2017-04-06 DIAGNOSIS — G2581 Restless legs syndrome: Secondary | ICD-10-CM | POA: Diagnosis present

## 2017-04-06 DIAGNOSIS — E1022 Type 1 diabetes mellitus with diabetic chronic kidney disease: Secondary | ICD-10-CM | POA: Diagnosis not present

## 2017-04-06 DIAGNOSIS — E1029 Type 1 diabetes mellitus with other diabetic kidney complication: Secondary | ICD-10-CM | POA: Diagnosis not present

## 2017-04-06 DIAGNOSIS — Z79899 Other long term (current) drug therapy: Secondary | ICD-10-CM | POA: Diagnosis not present

## 2017-04-06 DIAGNOSIS — N186 End stage renal disease: Secondary | ICD-10-CM | POA: Diagnosis not present

## 2017-04-06 DIAGNOSIS — Z940 Kidney transplant status: Secondary | ICD-10-CM | POA: Diagnosis not present

## 2017-04-06 DIAGNOSIS — Z944 Liver transplant status: Secondary | ICD-10-CM | POA: Diagnosis not present

## 2017-04-06 DIAGNOSIS — Z978 Presence of other specified devices: Secondary | ICD-10-CM | POA: Diagnosis not present

## 2017-04-06 DIAGNOSIS — E10649 Type 1 diabetes mellitus with hypoglycemia without coma: Secondary | ICD-10-CM | POA: Diagnosis present

## 2017-04-06 DIAGNOSIS — E1043 Type 1 diabetes mellitus with diabetic autonomic (poly)neuropathy: Secondary | ICD-10-CM | POA: Diagnosis present

## 2017-04-06 DIAGNOSIS — N183 Chronic kidney disease, stage 3 (moderate): Secondary | ICD-10-CM | POA: Diagnosis not present

## 2017-04-06 DIAGNOSIS — E10319 Type 1 diabetes mellitus with unspecified diabetic retinopathy without macular edema: Secondary | ICD-10-CM | POA: Diagnosis not present

## 2017-04-06 DIAGNOSIS — N189 Chronic kidney disease, unspecified: Secondary | ICD-10-CM | POA: Diagnosis not present

## 2017-04-06 DIAGNOSIS — Z794 Long term (current) use of insulin: Secondary | ICD-10-CM | POA: Diagnosis not present

## 2017-04-06 DIAGNOSIS — R0989 Other specified symptoms and signs involving the circulatory and respiratory systems: Secondary | ICD-10-CM | POA: Diagnosis not present

## 2017-04-06 DIAGNOSIS — Z960 Presence of urogenital implants: Secondary | ICD-10-CM | POA: Diagnosis not present

## 2017-04-06 DIAGNOSIS — R918 Other nonspecific abnormal finding of lung field: Secondary | ICD-10-CM | POA: Diagnosis not present

## 2017-04-15 DIAGNOSIS — D849 Immunodeficiency, unspecified: Secondary | ICD-10-CM | POA: Insufficient documentation

## 2017-04-17 DIAGNOSIS — K5909 Other constipation: Secondary | ICD-10-CM | POA: Diagnosis not present

## 2017-04-17 DIAGNOSIS — Z955 Presence of coronary angioplasty implant and graft: Secondary | ICD-10-CM | POA: Diagnosis not present

## 2017-04-17 DIAGNOSIS — Z9483 Pancreas transplant status: Secondary | ICD-10-CM | POA: Diagnosis not present

## 2017-04-17 DIAGNOSIS — D8989 Other specified disorders involving the immune mechanism, not elsewhere classified: Secondary | ICD-10-CM | POA: Diagnosis not present

## 2017-04-17 DIAGNOSIS — I10 Essential (primary) hypertension: Secondary | ICD-10-CM | POA: Diagnosis not present

## 2017-04-17 DIAGNOSIS — E119 Type 2 diabetes mellitus without complications: Secondary | ICD-10-CM | POA: Diagnosis not present

## 2017-04-17 DIAGNOSIS — D649 Anemia, unspecified: Secondary | ICD-10-CM | POA: Diagnosis not present

## 2017-04-17 DIAGNOSIS — Z940 Kidney transplant status: Secondary | ICD-10-CM | POA: Diagnosis not present

## 2017-04-17 DIAGNOSIS — Z79899 Other long term (current) drug therapy: Secondary | ICD-10-CM | POA: Diagnosis not present

## 2017-05-01 ENCOUNTER — Encounter (INDEPENDENT_AMBULATORY_CARE_PROVIDER_SITE_OTHER): Payer: Self-pay | Admitting: *Deleted

## 2017-05-12 DIAGNOSIS — Z940 Kidney transplant status: Secondary | ICD-10-CM | POA: Diagnosis not present

## 2017-05-19 DIAGNOSIS — K5901 Slow transit constipation: Secondary | ICD-10-CM | POA: Diagnosis not present

## 2017-05-19 DIAGNOSIS — Z940 Kidney transplant status: Secondary | ICD-10-CM | POA: Diagnosis not present

## 2017-05-19 DIAGNOSIS — D899 Disorder involving the immune mechanism, unspecified: Secondary | ICD-10-CM | POA: Diagnosis not present

## 2017-05-19 DIAGNOSIS — Z9483 Pancreas transplant status: Secondary | ICD-10-CM | POA: Diagnosis not present

## 2017-05-31 DIAGNOSIS — Z6830 Body mass index (BMI) 30.0-30.9, adult: Secondary | ICD-10-CM | POA: Diagnosis not present

## 2017-05-31 DIAGNOSIS — L608 Other nail disorders: Secondary | ICD-10-CM | POA: Diagnosis not present

## 2017-05-31 DIAGNOSIS — G608 Other hereditary and idiopathic neuropathies: Secondary | ICD-10-CM | POA: Diagnosis not present

## 2017-05-31 DIAGNOSIS — E109 Type 1 diabetes mellitus without complications: Secondary | ICD-10-CM | POA: Diagnosis not present

## 2017-06-08 ENCOUNTER — Ambulatory Visit: Payer: Medicare Other | Admitting: Podiatry

## 2017-06-09 DIAGNOSIS — Z79899 Other long term (current) drug therapy: Secondary | ICD-10-CM | POA: Diagnosis not present

## 2017-06-09 DIAGNOSIS — E1143 Type 2 diabetes mellitus with diabetic autonomic (poly)neuropathy: Secondary | ICD-10-CM | POA: Diagnosis not present

## 2017-06-09 DIAGNOSIS — S90222D Contusion of left lesser toe(s) with damage to nail, subsequent encounter: Secondary | ICD-10-CM | POA: Diagnosis not present

## 2017-06-09 DIAGNOSIS — G629 Polyneuropathy, unspecified: Secondary | ICD-10-CM | POA: Diagnosis not present

## 2017-06-09 DIAGNOSIS — D899 Disorder involving the immune mechanism, unspecified: Secondary | ICD-10-CM | POA: Diagnosis not present

## 2017-06-09 DIAGNOSIS — I151 Hypertension secondary to other renal disorders: Secondary | ICD-10-CM | POA: Diagnosis not present

## 2017-06-09 DIAGNOSIS — N2889 Other specified disorders of kidney and ureter: Secondary | ICD-10-CM | POA: Diagnosis not present

## 2017-06-09 DIAGNOSIS — I739 Peripheral vascular disease, unspecified: Secondary | ICD-10-CM | POA: Diagnosis not present

## 2017-06-09 DIAGNOSIS — Z940 Kidney transplant status: Secondary | ICD-10-CM | POA: Diagnosis not present

## 2017-06-09 DIAGNOSIS — K3184 Gastroparesis: Secondary | ICD-10-CM | POA: Diagnosis not present

## 2017-06-09 DIAGNOSIS — Z5181 Encounter for therapeutic drug level monitoring: Secondary | ICD-10-CM | POA: Diagnosis not present

## 2017-06-09 DIAGNOSIS — E1142 Type 2 diabetes mellitus with diabetic polyneuropathy: Secondary | ICD-10-CM | POA: Diagnosis not present

## 2017-06-20 DIAGNOSIS — E1021 Type 1 diabetes mellitus with diabetic nephropathy: Secondary | ICD-10-CM | POA: Diagnosis not present

## 2017-06-20 DIAGNOSIS — Z6830 Body mass index (BMI) 30.0-30.9, adult: Secondary | ICD-10-CM | POA: Diagnosis not present

## 2017-06-20 DIAGNOSIS — Z8639 Personal history of other endocrine, nutritional and metabolic disease: Secondary | ICD-10-CM | POA: Diagnosis not present

## 2017-06-20 DIAGNOSIS — I10 Essential (primary) hypertension: Secondary | ICD-10-CM | POA: Diagnosis not present

## 2017-06-20 DIAGNOSIS — D8989 Other specified disorders involving the immune mechanism, not elsewhere classified: Secondary | ICD-10-CM | POA: Diagnosis not present

## 2017-06-20 DIAGNOSIS — Z9483 Pancreas transplant status: Secondary | ICD-10-CM | POA: Diagnosis not present

## 2017-06-20 DIAGNOSIS — L608 Other nail disorders: Secondary | ICD-10-CM | POA: Diagnosis not present

## 2017-06-20 DIAGNOSIS — M25511 Pain in right shoulder: Secondary | ICD-10-CM | POA: Diagnosis not present

## 2017-06-23 DIAGNOSIS — Z940 Kidney transplant status: Secondary | ICD-10-CM | POA: Diagnosis not present

## 2017-06-27 ENCOUNTER — Other Ambulatory Visit: Payer: Self-pay | Admitting: Internal Medicine

## 2017-06-27 DIAGNOSIS — M25511 Pain in right shoulder: Secondary | ICD-10-CM

## 2017-06-29 ENCOUNTER — Ambulatory Visit
Admission: RE | Admit: 2017-06-29 | Discharge: 2017-06-29 | Disposition: A | Discharge status: Home / Self Care | Payer: 59 | Source: Ambulatory Visit | Attending: Internal Medicine | Admitting: Internal Medicine

## 2017-06-29 DIAGNOSIS — M25511 Pain in right shoulder: Secondary | ICD-10-CM

## 2017-07-07 DIAGNOSIS — K5904 Chronic idiopathic constipation: Secondary | ICD-10-CM | POA: Insufficient documentation

## 2017-07-20 ENCOUNTER — Ambulatory Visit (INDEPENDENT_AMBULATORY_CARE_PROVIDER_SITE_OTHER): Payer: Medicare Other | Admitting: Orthopedic Surgery

## 2017-07-27 ENCOUNTER — Ambulatory Visit (INDEPENDENT_AMBULATORY_CARE_PROVIDER_SITE_OTHER): Payer: 59 | Admitting: Orthopedic Surgery

## 2017-07-27 ENCOUNTER — Encounter (INDEPENDENT_AMBULATORY_CARE_PROVIDER_SITE_OTHER): Payer: Self-pay | Admitting: Orthopedic Surgery

## 2017-07-27 DIAGNOSIS — I70219 Atherosclerosis of native arteries of extremities with intermittent claudication, unspecified extremity: Secondary | ICD-10-CM

## 2017-07-27 DIAGNOSIS — G8929 Other chronic pain: Secondary | ICD-10-CM

## 2017-07-27 DIAGNOSIS — M25511 Pain in right shoulder: Secondary | ICD-10-CM

## 2017-07-27 NOTE — Progress Notes (Signed)
Office Visit Note   Patient: Gregory Allen           Date of Birth: 1971/08/24           MRN: [id number] Visit Date: 07/27/2017 Requested by: Velna Hatchet, MD [address] [place], [place] [address] PCP: Velna Hatchet, MD  Subjective: Chief Complaint  Patient presents with  . Right Shoulder - Follow-up    HPI: Stryker is a 68 show patient with right shouder pain.  Since I have be the him he has had MRI scan which shows supraspinatus tendinosis and mild biceps tendinopathy. He localizes pain primarily in the deltoid region and posteriorly.  He is not able to sleep well on the right side.  Denies much in the way of radiating biceps pain.  Lifting weights he is okay when he gets home after lifting weights he reports pain.  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  He is onto immunosuppressive agents.              ROS: All systems reviewed are negative as they relate to the chief complaint within the history of present illness.  Patient denies  fevers or chills.   Assessment & Plan: Visit Diagnoses:  1. Chronic right shoulder pain     Plan: impression is right shoulder pain with supraspinatus tendinopathy and a mild acromial hook and spur.  His area of tendinopathy is really right between the humeral head and the anterolateral hook.  I think options at this time would be injection versus spur removal.  He will discuss with his transplant surgeon and we can proceed per her that instructional session.  Follow up with me as needed.  He will call.  Follow-Up Instructions: Return if symptoms worsen or fail to improve.   Orders:  No orders of the defined types were placed in this encounter.  No orders of the defined types were placed in this encounter.     Procedures: No procedures performed   Clinical Data: No additional findings.  Objective: Vital Signs: There were no vitals taken for this visit.  Physical Exam:   Constitutional: Patient appears  well-developed HEENT:  Head: Normocephalic Eyes:EOM are normal Neck: Normal range of motion Cardiovascular: Normal rate Pulmonary/chest: Effort normal Neurologic: Patient is alert Skin: Skin is warm Psychiatric: Patient has normal mood and affect    Ortho Exam: orthopedic exam demonstrates full active and passive range of motion of the left and right shoulder with positive impingement signs on the right.  Rotator cuff strength is good to infraspinatus supraspinatus and subscapularis muscle testing.  No acromioclavicular joint tenderness is noted on the right or left hand side.  Negative apprehension and relocation testing.  No course grinding or crepitus with active or passive range of motion of the right shoulder  Specialty Comments:  No specialty comments available.  Imaging: No results found.   PMFS History: Patient Active Problem List   Diagnosis Date Noted  . BP (high blood pressure) 11/19/2015  . History of organ or tissue transplant 05/12/2015  . Essential (primary) hypertension 05/12/2015  . Combined fat and carbohydrate induced hyperlipemia 05/12/2015  . Type 1 diabetes mellitus with hyperglycemia ([facility]) 05/12/2015  . Arthritis of hand, degenerative 09/08/2014  . Hypophosphatemia 04/24/2012  . Carpal bone fracture 01/17/2012  . Tarsal tunnel syndrome 01/17/2012  . Nonproliferative diabetic retinopathy ([place]) 02/08/2011  . Type 1 diabetes mellitus ([facility] [place]) 02/08/2011  . Diabetic polyneuropathy ([facility]) 02/08/2011  . Abnormal presence of protein in  urine 02/08/2011   Past Medical History:  Diagnosis Date  . Diabetes mellitus without complication ([place])     No family history on file.  Past Surgical History:  Procedure Laterality Date  . TRANSPLANT PANCREATIC ALLOGRAFT     Social History   Occupational History  . Not on file.   Social History Main Topics  . Smoking status: Never Smoker  . Smokeless tobacco: Never Used  . Alcohol use No  . Drug use: Unknown  .  Sexual activity: Not on file

## 2017-08-23 ENCOUNTER — Encounter (INDEPENDENT_AMBULATORY_CARE_PROVIDER_SITE_OTHER): Payer: Self-pay | Admitting: Orthopedic Surgery

## 2017-08-23 ENCOUNTER — Ambulatory Visit (INDEPENDENT_AMBULATORY_CARE_PROVIDER_SITE_OTHER): Payer: Medicare Other | Admitting: Orthopedic Surgery

## 2017-08-23 DIAGNOSIS — M7541 Impingement syndrome of right shoulder: Secondary | ICD-10-CM | POA: Diagnosis not present

## 2017-08-23 MED ORDER — LIDOCAINE HCL 1 % IJ SOLN
5.0000 mL | INTRAMUSCULAR | Status: AC | PRN
  Administered 2017-08-23: 5 mL

## 2017-08-23 MED ORDER — BUPIVACAINE HCL 0.5 % IJ SOLN
9.0000 mL | INTRAMUSCULAR | Status: AC | PRN
  Administered 2017-08-23: 9 mL via INTRA_ARTICULAR

## 2017-08-23 MED ORDER — METHYLPREDNISOLONE ACETATE 40 MG/ML IJ SUSP
40.0000 mg | INTRAMUSCULAR | Status: AC | PRN
  Administered 2017-08-23: 40 mg via INTRA_ARTICULAR

## 2017-08-23 NOTE — Progress Notes (Signed)
Office Visit Note   Patient: Gregory Allen           Date of Birth: 11/18/1970           MRN: [id number] Visit Date: 08/23/2017 Requested by: Velna Hatchet, MD [address] [place], [place] [address] PCP: Velna Hatchet, MD  Subjective: Chief Complaint  Patient presents with  . Right Shoulder - Pain    HPI: Gregory Allen is a 46 year old patient who underwent kidney pancreas transplant in June of this year.  He has right shoulder pain consistent with impingement bursitis.  Since I last saw him his physicians have cleared him for further intervention on his right shoulder except for surgery.Marland Kitchen  He reports continued pain in the deltoid region and pain with overhead activity.  He is disabled.  He has been going to the gym to work out.              ROS: All systems reviewed are negative as they relate to the chief complaint within the history of present illness.  Patient denies  fevers or chills.   Assessment & Plan: Visit Diagnoses:  1. Impingement syndrome of right shoulder     Plan: impression is right shoulder impingement syndrome in a patient with kidney pancreas transplant.  Plan is injection today.  I will see him back as needed.  Getting back into the gym and working on shoulder exercises is indicated after a period of 1 week to allow this cortisone injection to diminish swelling of the bursa  Follow-Up Instructions: Return if symptoms worsen or fail to improve.   Orders:  No orders of the defined types were placed in this encounter.  No orders of the defined types were placed in this encounter.     Procedures: Large Joint Inj Date/Time: 08/23/2017 3:42 PM Performed by: Meredith Pel Authorized by: Meredith Pel   Consent Given by:  Patient Site marked: the procedure site was marked   Timeout: prior to procedure the correct patient, procedure, and site was verified   Indications:  Pain and diagnostic evaluation Location:  Shoulder Site:  R  subacromial bursa Prep: patient was prepped and draped in usual sterile fashion   Needle Size:  18 G Needle Length:  1.5 inches Approach:  Posterior Ultrasound Guidance: No   Fluoroscopic Guidance: No   Arthrogram: No   Medications:  5 mL lidocaine 1 %; 9 mL bupivacaine 0.5 %; 40 mg methylPREDNISolone acetate 40 MG/ML Aspiration Attempted: No   Patient tolerance:  Patient tolerated the procedure well with no immediate complications     Clinical Data: No additional findings.  Objective: Vital Signs: There were no vitals taken for this visit.  Physical Exam:   Constitutional: Patient appears well-developed HEENT:  Head: Normocephalic Eyes:EOM are normal Neck: Normal range of motion Cardiovascular: Normal rate Pulmonary/chest: Effort normal Neurologic: Patient is alert Skin: Skin is warm Psychiatric: Patient has normal mood and affect    Ortho Exam: orthopedic exam demonstrates full active and passive range of motion of the right shoulder with positive impingement signs.  Rotator cuff strength is excellent.  No acromioclavicular joint tenderness is noted.  No other masses lymph adenopathy or skin changes noted in the shoulder girdle region  Specialty Comments:  No specialty comments available.  Imaging: No results found.   PMFS History: Patient Active Problem List   Diagnosis Date Noted  . BP (high blood pressure) 11/19/2015  . History of organ or tissue transplant 05/12/2015  . Essential (primary) hypertension  05/12/2015  . Combined fat and carbohydrate induced hyperlipemia 05/12/2015  . Type 1 diabetes mellitus with hyperglycemia ([facility]) 05/12/2015  . Arthritis of hand, degenerative 09/08/2014  . Hypophosphatemia 04/24/2012  . Carpal bone fracture 01/17/2012  . Tarsal tunnel syndrome 01/17/2012  . Nonproliferative diabetic retinopathy ([place]) 02/08/2011  . Type 1 diabetes mellitus ([place]) 02/08/2011  . Diabetic polyneuropathy ([place]) 02/08/2011  . Abnormal presence  of protein in urine 02/08/2011   Past Medical History:  Diagnosis Date  . Diabetes mellitus without complication ([facility])     No family history on file.  Past Surgical History:  Procedure Laterality Date  . TRANSPLANT PANCREATIC ALLOGRAFT     Social History   Occupational History  . Not on file.   Social History Main Topics  . Smoking status: Never Smoker  . Smokeless tobacco: Never Used  . Alcohol use No  . Drug use: Unknown  . Sexual activity: Not on file

## 2017-08-25 ENCOUNTER — Ambulatory Visit (HOSPITAL_COMMUNITY)
Admission: RE | Admit: 2017-08-25 | Discharge: 2017-08-25 | Disposition: A | Discharge status: Home / Self Care | Payer: 59 | Source: Ambulatory Visit | Attending: Family | Admitting: Family

## 2017-08-25 ENCOUNTER — Ambulatory Visit (INDEPENDENT_AMBULATORY_CARE_PROVIDER_SITE_OTHER): Payer: 59 | Admitting: Family

## 2017-08-25 ENCOUNTER — Encounter: Payer: Self-pay | Admitting: Family

## 2017-08-25 VITALS — BP 123/81 | HR 74 | Temp 97.9°F | Resp 18 | Ht 70.0 in | Wt 197.0 lb

## 2017-08-25 DIAGNOSIS — E108 Type 1 diabetes mellitus with unspecified complications: Secondary | ICD-10-CM

## 2017-08-25 DIAGNOSIS — R9439 Abnormal result of other cardiovascular function study: Secondary | ICD-10-CM | POA: Insufficient documentation

## 2017-08-25 DIAGNOSIS — Z940 Kidney transplant status: Secondary | ICD-10-CM

## 2017-08-25 DIAGNOSIS — Z8639 Personal history of other endocrine, nutritional and metabolic disease: Secondary | ICD-10-CM

## 2017-08-25 DIAGNOSIS — I70219 Atherosclerosis of native arteries of extremities with intermittent claudication, unspecified extremity: Secondary | ICD-10-CM | POA: Diagnosis not present

## 2017-08-25 DIAGNOSIS — E1065 Type 1 diabetes mellitus with hyperglycemia: Secondary | ICD-10-CM | POA: Diagnosis not present

## 2017-08-25 DIAGNOSIS — Z9483 Pancreas transplant status: Secondary | ICD-10-CM | POA: Diagnosis not present

## 2017-08-25 DIAGNOSIS — IMO0002 Reserved for concepts with insufficient information to code with codable children: Secondary | ICD-10-CM

## 2017-08-25 NOTE — Patient Instructions (Signed)
Intermittent Claudication Intermittent claudication is pain in your leg that occurs when you walk or exercise and goes away when you rest. The pain can occur in one or both legs. What are the causes? Intermittent claudication is caused by the buildup of plaque within the major arteries in the body (atherosclerosis). The plaque, which makes arteries stiff and narrow, prevents enough blood from reaching your leg muscles. The pain occurs when you walk or exercise because your muscles need more blood when you are moving and exercising. What increases the risk? Risk factors include:  A family history of atherosclerosis.  A personal history of stroke or heart disease.  Older age.  Being inactive or overweight.  Smoking cigarettes.  Having another health condition such as: ? Diabetes. ? High blood pressure. ? High cholesterol.  What are the signs or symptoms? Your hip or leg may:  Ache.  Cramp.  Feel tight.  Feel weak.  Feel heavy.  Over time, you may feel pain in your calf, thigh, or hip. How is this diagnosed? Your health care provider may diagnose intermittent claudication based on your symptoms and medical history. Your health care provider may also do tests to learn more about your condition. These may include:  Blood tests.  An ultrasound.  Imaging tests such as angiography, magnetic resonance angiography (MRA), and computed tomography angiography (CTA).  How is this treated? You may be treated for problems such as:  High blood pressure.  High cholesterol.  Diabetes.  Other treatments may include:  Lifestyle changes such as: ? Starting an exercise program. ? Losing weight. ? Quitting smoking.  Medicines to help restore blood flow through your legs.  Blood vessel surgery (angioplasty) to restore blood flow if your intermittent claudication is caused by severe peripheral artery disease.  Follow these instructions at home:  Manage any other health  conditions you have.  Eat a diet low in saturated fats and calories to maintain a healthy weight.  Quit smoking, if you smoke.  Take medicines only as directed by your health care provider.  If your health care provider recommended an exercise program for you, follow it as directed. Your exercise program may involve: ? Walking three or more times a week. ? Walking until you have certain symptoms of intermittent claudication. ? Resting until symptoms go away. ? Gradually increasing walking time to about 50 minutes a day. Contact a health care provider if: Your condition is not getting better or is getting worse. Get help right away if:  You have chest pain.  You have difficulty breathing.  You develop arm weakness.  You have trouble speaking.  Your face begins to droop. This information is not intended to replace advice given to you by your health care provider. Make sure you discuss any questions you have with your health care provider. Document Released: 08/26/2004 Document Revised: 03/31/2016 Document Reviewed: 01/30/2014 [employer] Interactive Patient Education  2017 [employer]  

## 2017-08-25 NOTE — Progress Notes (Signed)
[facility] OF [place]   CC: Follow up peripheral artery occlusive disease  History of Present Illness Gregory Allen is a 46 y.o. male as new patient to Dr. Donzetta Matters on 02-17-17 for bilateral lower extremity burning in his calf with associated cramping. The left is worse than the right. He states that it does occasionally occur at rest but mostly after walking. He states sometimes he walks a few feet before happens but he can continue to walk. In particular he is able to go to the gym and walking at least a mile on the treadmill. From an upper body standpoint he is very healthy and lifts weights without issue. He has never had a stroke or cardiac issues. 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 He is currently on the pancreas transplant list and wait for this. He has associated leg swelling occasionally and it and left lateral thigh pain. He does not have rest pain tissue loss or ulceration. He does take daily aspirin. Pt is a long-time diabetic with calf cramping with any activity but is able to walk at least a mile on a treadmill does not have rest pain or tissue loss at this time does not appear to have life limitation from his calf cramping.  At the initial visit it appeared that his pain was multifactorial in nature in his bilateral lower extremities from possible back pain given the hip component as well. He had near normal ABIs although monophasic signals from likely calcified vessels from his long-term diabetes. Dr. Donzetta Matters discussed intervention versus conservative management with addition of Pletal which is the route he wanted to go at that time. Dr. Donzetta Matters advised follow up in 6 months with repeat ABI with toe pressures.   He exercises daily in a gym, cycles, uses elliptical.  He reports pain in both calves walking or not, right more so than left, but is worse with walking. 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  He states the Ptetal did help ease the walking pain in his legs.   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   He denies any known hx of stroke or TIA.  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   Pt Diabetic: Yes, diagnosed at age 39 Pt smoker: non-smoker  Pt meds include: Statin :No, states his cholesterol is good Betablocker: yes ASA: Yes Other anticoagulants/antiplatelets: no   Past Medical History:  Diagnosis Date  . Diabetes mellitus without complication [facility])     Social History Social History  Substance Use Topics  . Smoking status: Never Smoker  . Smokeless tobacco: Never Used  . Alcohol use No    Family History No family history on file.  Past Surgical History:  Procedure Laterality Date  . TRANSPLANT PANCREATIC ALLOGRAFT      Allergies  Allergen Reactions  . Shellfish-Derived Products Itching    "throat starts itching" pt states "he is ok with iodine"  . Hydrocodone-Acetaminophen Other (See Comments)  . Other Other (See Comments)    Shellfish per pt  . Shellfish Allergy     Current Outpatient Prescriptions  Medication Sig Dispense Refill  . carvedilol (COREG) 12.5 MG tablet Take 12.5 mg by mouth 2 (two) times daily with a meal.    .  cilostazol (PLETAL) 100 MG tablet Take 1 tablet (100 mg total) by mouth 2 (two) times daily before a meal. 60 tablet 11  . glucagon (GLUCAGON EMERGENCY) 1 MG injection Inject 1 mg into the vein once as needed.     Marland Kitchen glucose blood (FREESTYLE TEST STRIPS) test strip To be tested 4 times daily    . hydrALAZINE (APRESOLINE) 25 MG tablet Take 25 mg by mouth.    . insulin aspart (NOVOLOG) 100 UNIT/ML injection As directed, using insulin pump. 1:30 carb ratio    . insulin detemir (LEVEMIR) 100 UNIT/ML injection Inject 18 Units into the skin at bedtime.    . Insulin Pen Needle (B-D  ULTRAFINE III SHORT PEN) 31G X 8 MM MISC 1 each by Does not apply route.    . Insulin Syringe-Needle U-100 (B-D INSULIN SYRINGE 1CC/25GX1") 25G X 1" 1 ML MISC Use as directed    . linaclotide (LINZESS) 145 MCG CAPS capsule Take 145 mcg by mouth daily as needed.    . mycophenolate (MYFORTIC) 360 MG TBEC EC tablet Take 360 mg by mouth 2 (two) times daily.    . NONFORMULARY OR COMPOUNDED [place] compound:  Peripheral Neuropathy cream - Bupivacaine 1%, doxepin 3%, Gabapentin 6%, Pentoxifylline 3%, Topiramate 1%, dispense 180gm, apply 1-2 grams to affected area 3-4 times daily, +3refills. 180 each 3  . ondansetron (ZOFRAN ODT) 8 MG disintegrating tablet Take 1 tablet (8 mg total) by mouth every 8 (eight) hours as needed for nausea or vomiting. 10 tablet 0  . tacrolimus (PROGRAF) 0.5 MG capsule Take 0.5 mg by mouth 2 (two) times daily. Takes with 4mg  = 4.5mg     . tacrolimus (PROGRAF) 1 MG capsule Take 4 mg by mouth 2 (two) times daily. Takes with 0.5mg  = 4.5mg      . HYDROcodone-acetaminophen (NORCO/VICODIN) 5-325 MG tablet Take 1 tablet by mouth every 6 (six) hours as needed for moderate pain. (Patient not taking: Reported on 08/25/2017) 30 tablet 0   No current facility-administered medications for this visit.     ROS: See HPI for pertinent positives and negatives.   Physical Examination  Vitals:   08/25/17 0837  BP: 123/81  Pulse: 74  Resp: 18  Temp: 97.9 F (36.6 C)  SpO2: 100%  Weight: 197 lb (89.4 kg)  Height: 5\' 10"  (1.778 m)   Body mass index is 28.27 kg/m.  General: A&O x 3, WDWN, fit appearing male. Gait: normal Eyes: PERRLA. Pulmonary: Respirations are non labored, CTAB, good air movement Cardiac: regular rhythm, no detected murmur.         Carotid Bruits Right Left   Negative Negative   Radial pulses are 2+ palpable bilaterally   Adominal aortic pulse is not palpable                         VASCULAR EXAM: Extremities without ischemic changes, without  Gangrene; without open wounds.  LE Pulses Right Left       FEMORAL  2+ palpable  2+ palpable        POPLITEAL  not palpable   not palpable       POSTERIOR TIBIAL  not palpable   not palpable        DORSALIS PEDIS      ANTERIOR TIBIAL 1+ palpable  not palpable    Abdomen: soft, NT, no palpable masses. Long well healed midline incision.  Skin: no rashes, no ulcers noted. Musculoskeletal: no muscle wasting or atrophy.  Neurologic: A&O X 3; Appropriate Affect ; SENSATION: normal; MOTOR FUNCTION:  moving all extremities equally, motor strength 5/5 throughout. Speech is fluent/normal. CN 2-12 intact.    ASSESSMENT: Gregory Allen is a 46 y.o. male who presents with pain in both legs at rest, but primarily with walking. He also has a history of lumbar spine surgery.  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  Since ABI's demonstrate mild bilateral arterial occlusive disease with tri and biphasic waveforms, the rest pain in his legs is not due to inadequate arterial perfusion; is most likely multifactorial with a hx of lumbar spine surgery and possible neuropathic pain secondary to hx of Type 1 DM for 40 years.   DATA  ABI (Date: 08/25/2017):  R:   ABI: 0.94 (was 0.92 on 02-06-17),   PT: bi  DP: tri  TBI:  0.59  L:   ABI: 0.88 (was 0.96),   PT: bi  DP: bi  TBI: 1.02 Stable bilateral ABI with mild disease, tri and biphasic waveforms.    PLAN:  Based on the patient's vascular studies and examination, pt will return to clinic in 6 months with ABI's.  I advised pt to notify us if he develops concerns re the circulation in his feet or legs.   I discussed in depth with the patient the nature of atherosclerosis, and emphasized the importance of maximal medical management including strict control of blood  pressure, blood glucose, and lipid levels, obtaining regular exercise, and continued cessation of smoking.  The patient is aware that without maximal medical management the underlying atherosclerotic disease process will progress, limiting the benefit of any interventions.  The patient was given information about PAD including signs, symptoms, treatment, what symptoms should prompt the patient to seek immediate medical care, and risk reduction measures to take.  Gregory Chambers, RN, MSN, FNP-C [facility] [employer] Phone: [phone]  Clinic MD: Donzetta Matters  08/25/17 8:50 AM

## 2017-08-31 DIAGNOSIS — D899 Disorder involving the immune mechanism, unspecified: Secondary | ICD-10-CM | POA: Diagnosis not present

## 2017-08-31 DIAGNOSIS — Z940 Kidney transplant status: Secondary | ICD-10-CM | POA: Diagnosis not present

## 2017-09-12 NOTE — Addendum Note (Signed)
Addended by: Lianne Cure A on: 09/12/2017 03:57 PM   Modules accepted: Orders

## 2017-09-15 DIAGNOSIS — Z940 Kidney transplant status: Secondary | ICD-10-CM | POA: Diagnosis not present

## 2017-09-15 DIAGNOSIS — Z9483 Pancreas transplant status: Secondary | ICD-10-CM | POA: Diagnosis not present

## 2017-09-15 DIAGNOSIS — Z79899 Other long term (current) drug therapy: Secondary | ICD-10-CM | POA: Diagnosis not present

## 2017-09-15 DIAGNOSIS — D899 Disorder involving the immune mechanism, unspecified: Secondary | ICD-10-CM | POA: Diagnosis not present

## 2017-09-27 DIAGNOSIS — Z940 Kidney transplant status: Secondary | ICD-10-CM | POA: Diagnosis not present

## 2017-09-27 DIAGNOSIS — D899 Disorder involving the immune mechanism, unspecified: Secondary | ICD-10-CM | POA: Diagnosis not present

## 2017-10-04 DIAGNOSIS — M79671 Pain in right foot: Secondary | ICD-10-CM | POA: Diagnosis not present

## 2017-10-04 DIAGNOSIS — E1121 Type 2 diabetes mellitus with diabetic nephropathy: Secondary | ICD-10-CM | POA: Diagnosis not present

## 2017-10-04 DIAGNOSIS — Z6830 Body mass index (BMI) 30.0-30.9, adult: Secondary | ICD-10-CM | POA: Diagnosis not present

## 2017-10-04 DIAGNOSIS — G608 Other hereditary and idiopathic neuropathies: Secondary | ICD-10-CM | POA: Diagnosis not present

## 2017-10-13 DIAGNOSIS — M25511 Pain in right shoulder: Secondary | ICD-10-CM | POA: Diagnosis not present

## 2017-10-13 DIAGNOSIS — K219 Gastro-esophageal reflux disease without esophagitis: Secondary | ICD-10-CM | POA: Diagnosis not present

## 2017-10-13 DIAGNOSIS — N2889 Other specified disorders of kidney and ureter: Secondary | ICD-10-CM | POA: Diagnosis not present

## 2017-10-13 DIAGNOSIS — Z940 Kidney transplant status: Secondary | ICD-10-CM | POA: Diagnosis not present

## 2017-10-13 DIAGNOSIS — I10 Essential (primary) hypertension: Secondary | ICD-10-CM | POA: Diagnosis not present

## 2017-10-13 DIAGNOSIS — Z7952 Long term (current) use of systemic steroids: Secondary | ICD-10-CM | POA: Diagnosis not present

## 2017-10-13 DIAGNOSIS — K5909 Other constipation: Secondary | ICD-10-CM | POA: Diagnosis not present

## 2017-10-13 DIAGNOSIS — Z5181 Encounter for therapeutic drug level monitoring: Secondary | ICD-10-CM | POA: Diagnosis not present

## 2017-10-13 DIAGNOSIS — Z792 Long term (current) use of antibiotics: Secondary | ICD-10-CM | POA: Diagnosis not present

## 2017-10-13 DIAGNOSIS — Z79899 Other long term (current) drug therapy: Secondary | ICD-10-CM | POA: Diagnosis not present

## 2017-10-13 DIAGNOSIS — E10649 Type 1 diabetes mellitus with hypoglycemia without coma: Secondary | ICD-10-CM | POA: Diagnosis not present

## 2017-10-13 DIAGNOSIS — G629 Polyneuropathy, unspecified: Secondary | ICD-10-CM | POA: Diagnosis not present

## 2017-10-13 DIAGNOSIS — I739 Peripheral vascular disease, unspecified: Secondary | ICD-10-CM | POA: Diagnosis not present

## 2017-10-13 DIAGNOSIS — E1043 Type 1 diabetes mellitus with diabetic autonomic (poly)neuropathy: Secondary | ICD-10-CM | POA: Diagnosis not present

## 2017-10-13 DIAGNOSIS — Z48288 Encounter for aftercare following multiple organ transplant: Secondary | ICD-10-CM | POA: Diagnosis not present

## 2017-10-13 DIAGNOSIS — Z9483 Pancreas transplant status: Secondary | ICD-10-CM | POA: Diagnosis not present

## 2017-10-13 DIAGNOSIS — D899 Disorder involving the immune mechanism, unspecified: Secondary | ICD-10-CM | POA: Diagnosis not present

## 2017-10-13 DIAGNOSIS — I151 Hypertension secondary to other renal disorders: Secondary | ICD-10-CM | POA: Diagnosis not present

## 2017-10-17 DIAGNOSIS — H5213 Myopia, bilateral: Secondary | ICD-10-CM | POA: Diagnosis not present

## 2017-10-17 DIAGNOSIS — H52223 Regular astigmatism, bilateral: Secondary | ICD-10-CM | POA: Diagnosis not present

## 2017-10-17 DIAGNOSIS — H524 Presbyopia: Secondary | ICD-10-CM | POA: Diagnosis not present

## 2017-10-17 DIAGNOSIS — E119 Type 2 diabetes mellitus without complications: Secondary | ICD-10-CM | POA: Diagnosis not present

## 2017-10-20 ENCOUNTER — Other Ambulatory Visit: Payer: Self-pay | Admitting: Podiatry

## 2017-10-20 ENCOUNTER — Ambulatory Visit (INDEPENDENT_AMBULATORY_CARE_PROVIDER_SITE_OTHER): Payer: Medicare Other

## 2017-10-20 ENCOUNTER — Encounter: Payer: Self-pay | Admitting: Podiatry

## 2017-10-20 ENCOUNTER — Ambulatory Visit (INDEPENDENT_AMBULATORY_CARE_PROVIDER_SITE_OTHER): Payer: Medicare Other | Admitting: Podiatry

## 2017-10-20 DIAGNOSIS — M79671 Pain in right foot: Secondary | ICD-10-CM

## 2017-10-20 DIAGNOSIS — L03031 Cellulitis of right toe: Secondary | ICD-10-CM

## 2017-10-20 DIAGNOSIS — M79672 Pain in left foot: Secondary | ICD-10-CM | POA: Diagnosis not present

## 2017-10-20 DIAGNOSIS — M779 Enthesopathy, unspecified: Secondary | ICD-10-CM | POA: Diagnosis not present

## 2017-10-20 DIAGNOSIS — I70219 Atherosclerosis of native arteries of extremities with intermittent claudication, unspecified extremity: Secondary | ICD-10-CM

## 2017-10-20 DIAGNOSIS — Z9483 Pancreas transplant status: Secondary | ICD-10-CM | POA: Diagnosis not present

## 2017-10-20 DIAGNOSIS — Z940 Kidney transplant status: Secondary | ICD-10-CM | POA: Diagnosis not present

## 2017-10-20 MED ORDER — TRIAMCINOLONE ACETONIDE 10 MG/ML IJ SUSP
10.0000 mg | Freq: Once | INTRAMUSCULAR | Status: AC
  Administered 2017-10-20: 10 mg

## 2017-10-20 NOTE — Patient Instructions (Signed)
Epsom Salt Soak Instructions   Place 1/4 cup of epsom salts in a quart of warm tap water.  Soak your foot or feet in the solution for 20 minutes twice a day until you notice the area has dried and a scab has formed. Continue to apply other medications to the area as directed by the doctor such as polysporin, neosporin or cortisporin drops.  IF YOUR SKIN BECOMES IRRITATED WHILE USING THESE INSTRUCTIONS, IT IS OKAY TO SWITCH TO  WHITE VINEGAR AND WATER. Or you may use antibacterial soap and water to keep the toe clean  Monitor for any signs/symptoms of infection. Call the office immediately if any occur or go directly to the emergency room. Call with any questions/concerns.

## 2017-10-21 NOTE — Progress Notes (Signed)
Subjective:   Patient ID: Gregory Allen, male   DOB: 46 y.o.   MRN: [id number]   HPI Patient presents with a lot of pain in the left dorsal lateral foot with inflammation and fluid buildup and also on the right big toe has irritation of the medial border with incurvation or drug and pain with palpation.  He is just had a pancreas and kidney transplant in the last 6 months   ROS      Objective:  Physical Exam  Neurovascular status is slightly diminished but intact with inflammation of the lateral tenderness complex left and incurvated nail border right hallux that is painful when pressed and making shoe gear difficult.  There is localized redness but there is no proximal edema erythema or drainage noted and patient did have good digital perfusion     Assessment:  Paronychia infection of the right hallux medial border localized and tendinitis left lateral foot     Plan:  Reviewed both conditions and x-ray of the left foot today I infiltrated the right hallux 60 mg like Marcaine mixture and under sterile conditions I removed the medial border and allow channel for drainage.  Gave instructions on soaks and for the left I did inject the dorsal lateral complex 3 mg Kenalog 5 mg Xylocaine advised on reduced activity and supportive shoes and patient will be seen back to recheck  X-rays indicate no signs of stress fracture or advanced arthritis

## 2017-10-25 DIAGNOSIS — I10 Essential (primary) hypertension: Secondary | ICD-10-CM | POA: Diagnosis not present

## 2017-10-25 DIAGNOSIS — Z6830 Body mass index (BMI) 30.0-30.9, adult: Secondary | ICD-10-CM | POA: Diagnosis not present

## 2017-10-25 DIAGNOSIS — E1021 Type 1 diabetes mellitus with diabetic nephropathy: Secondary | ICD-10-CM | POA: Diagnosis not present

## 2017-10-25 DIAGNOSIS — J069 Acute upper respiratory infection, unspecified: Secondary | ICD-10-CM | POA: Diagnosis not present

## 2017-10-25 DIAGNOSIS — R05 Cough: Secondary | ICD-10-CM | POA: Diagnosis not present

## 2017-11-05 ENCOUNTER — Other Ambulatory Visit: Payer: Self-pay

## 2017-11-05 ENCOUNTER — Emergency Department (HOSPITAL_COMMUNITY)
Admission: EM | Admit: 2017-11-05 | Discharge: 2017-11-05 | Disposition: A | Discharge status: Other Acute Inpatient Hospital | Payer: 59 | Attending: Emergency Medicine | Admitting: Emergency Medicine

## 2017-11-05 ENCOUNTER — Emergency Department (HOSPITAL_COMMUNITY): Payer: 59

## 2017-11-05 ENCOUNTER — Encounter (HOSPITAL_COMMUNITY): Payer: Self-pay | Admitting: Emergency Medicine

## 2017-11-05 DIAGNOSIS — E109 Type 1 diabetes mellitus without complications: Secondary | ICD-10-CM | POA: Insufficient documentation

## 2017-11-05 DIAGNOSIS — Z940 Kidney transplant status: Secondary | ICD-10-CM | POA: Insufficient documentation

## 2017-11-05 DIAGNOSIS — I151 Hypertension secondary to other renal disorders: Secondary | ICD-10-CM | POA: Diagnosis not present

## 2017-11-05 DIAGNOSIS — Z794 Long term (current) use of insulin: Secondary | ICD-10-CM | POA: Insufficient documentation

## 2017-11-05 DIAGNOSIS — E785 Hyperlipidemia, unspecified: Secondary | ICD-10-CM | POA: Diagnosis present

## 2017-11-05 DIAGNOSIS — R05 Cough: Secondary | ICD-10-CM | POA: Diagnosis not present

## 2017-11-05 DIAGNOSIS — D899 Disorder involving the immune mechanism, unspecified: Secondary | ICD-10-CM | POA: Diagnosis not present

## 2017-11-05 DIAGNOSIS — R509 Fever, unspecified: Secondary | ICD-10-CM | POA: Diagnosis not present

## 2017-11-05 DIAGNOSIS — E10319 Type 1 diabetes mellitus with unspecified diabetic retinopathy without macular edema: Secondary | ICD-10-CM | POA: Diagnosis present

## 2017-11-05 DIAGNOSIS — J101 Influenza due to other identified influenza virus with other respiratory manifestations: Secondary | ICD-10-CM | POA: Diagnosis not present

## 2017-11-05 DIAGNOSIS — Z79899 Other long term (current) drug therapy: Secondary | ICD-10-CM | POA: Insufficient documentation

## 2017-11-05 DIAGNOSIS — J111 Influenza due to unidentified influenza virus with other respiratory manifestations: Secondary | ICD-10-CM | POA: Insufficient documentation

## 2017-11-05 DIAGNOSIS — Z48288 Encounter for aftercare following multiple organ transplant: Secondary | ICD-10-CM | POA: Diagnosis not present

## 2017-11-05 DIAGNOSIS — I10 Essential (primary) hypertension: Secondary | ICD-10-CM | POA: Insufficient documentation

## 2017-11-05 DIAGNOSIS — J09X2 Influenza due to identified novel influenza A virus with other respiratory manifestations: Secondary | ICD-10-CM | POA: Diagnosis not present

## 2017-11-05 DIAGNOSIS — G2581 Restless legs syndrome: Secondary | ICD-10-CM | POA: Diagnosis present

## 2017-11-05 DIAGNOSIS — Z9483 Pancreas transplant status: Secondary | ICD-10-CM | POA: Insufficient documentation

## 2017-11-05 DIAGNOSIS — E1051 Type 1 diabetes mellitus with diabetic peripheral angiopathy without gangrene: Secondary | ICD-10-CM | POA: Diagnosis present

## 2017-11-05 DIAGNOSIS — E108 Type 1 diabetes mellitus with unspecified complications: Secondary | ICD-10-CM | POA: Diagnosis not present

## 2017-11-05 DIAGNOSIS — E1043 Type 1 diabetes mellitus with diabetic autonomic (poly)neuropathy: Secondary | ICD-10-CM | POA: Diagnosis present

## 2017-11-05 DIAGNOSIS — R918 Other nonspecific abnormal finding of lung field: Secondary | ICD-10-CM | POA: Diagnosis not present

## 2017-11-05 LAB — LIPASE, BLOOD: Lipase: 24 U/L (ref 11–51)

## 2017-11-05 LAB — URINALYSIS, ROUTINE W REFLEX MICROSCOPIC
BILIRUBIN URINE: NEGATIVE
Bacteria, UA: NONE SEEN
GLUCOSE, UA: NEGATIVE mg/dL
Hgb urine dipstick: NEGATIVE
KETONES UR: NEGATIVE mg/dL
LEUKOCYTES UA: NEGATIVE
NITRITE: NEGATIVE
PH: 5 (ref 5.0–8.0)
Protein, ur: 30 mg/dL — AB
SPECIFIC GRAVITY, URINE: 1.029 (ref 1.005–1.030)

## 2017-11-05 LAB — CBC
HCT: 40.8 % (ref 39.0–52.0)
HEMOGLOBIN: 12.9 g/dL — AB (ref 13.0–17.0)
MCH: 24.8 pg — AB (ref 26.0–34.0)
MCHC: 31.6 g/dL (ref 30.0–36.0)
MCV: 78.5 fL (ref 78.0–100.0)
PLATELETS: 264 10*3/uL (ref 150–400)
RBC: 5.2 MIL/uL (ref 4.22–5.81)
RDW: 14.9 % (ref 11.5–15.5)
WBC: 8.2 10*3/uL (ref 4.0–10.5)

## 2017-11-05 LAB — COMPREHENSIVE METABOLIC PANEL
ALK PHOS: 58 U/L (ref 38–126)
ALT: 36 U/L (ref 17–63)
ANION GAP: 14 (ref 5–15)
AST: 24 U/L (ref 15–41)
Albumin: 3.9 g/dL (ref 3.5–5.0)
BILIRUBIN TOTAL: 0.8 mg/dL (ref 0.3–1.2)
BUN: 20 mg/dL (ref 6–20)
CALCIUM: 8.9 mg/dL (ref 8.9–10.3)
CO2: 18 mmol/L — AB (ref 22–32)
CREATININE: 1.68 mg/dL — AB (ref 0.61–1.24)
Chloride: 101 mmol/L (ref 101–111)
GFR, EST AFRICAN AMERICAN: 55 mL/min — AB (ref >60)
GFR, EST NON AFRICAN AMERICAN: 47 mL/min — AB (ref >60)
Glucose, Bld: 132 mg/dL — ABNORMAL HIGH (ref 65–99)
Potassium: 3.6 mmol/L (ref 3.5–5.1)
SODIUM: 133 mmol/L — AB (ref 135–145)
TOTAL PROTEIN: 7.6 g/dL (ref 6.5–8.1)

## 2017-11-05 LAB — INFLUENZA PANEL BY PCR (TYPE A & B)
INFLAPCR: POSITIVE — AB
Influenza B By PCR: NEGATIVE

## 2017-11-05 LAB — I-STAT CG4 LACTIC ACID, ED: Lactic Acid, Venous: 0.76 mmol/L (ref 0.5–1.9)

## 2017-11-05 MED ORDER — SODIUM CHLORIDE 0.9 % IV BOLUS (SEPSIS)
1000.0000 mL | Freq: Once | INTRAVENOUS | Status: AC
  Administered 2017-11-05: 1000 mL via INTRAVENOUS

## 2017-11-05 MED ORDER — ACETAMINOPHEN 325 MG PO TABS
650.0000 mg | ORAL_TABLET | Freq: Once | ORAL | Status: AC
  Administered 2017-11-05: 650 mg via ORAL

## 2017-11-05 MED ORDER — SODIUM CHLORIDE 0.9 % IV SOLN
1500.0000 mg | Freq: Once | INTRAVENOUS | Status: AC
  Administered 2017-11-05: 1500 mg via INTRAVENOUS
  Filled 2017-11-05: qty 1500

## 2017-11-05 MED ORDER — PIPERACILLIN-TAZOBACTAM 3.375 G IVPB 30 MIN
3.3750 g | Freq: Once | INTRAVENOUS | Status: AC
  Administered 2017-11-05: 3.375 g via INTRAVENOUS
  Filled 2017-11-05: qty 50

## 2017-11-05 MED ORDER — DEXTROSE 5 % IV SOLN: 1.0000 g | Freq: Once | INTRAVENOUS | Status: DC

## 2017-11-05 NOTE — ED Triage Notes (Signed)
Pt. Gregory Allen been sick with cough, fever, throwing up and chills for about 3 days.

## 2017-11-05 NOTE — ED Provider Notes (Signed)
Patient was awaiting transfer. Bed is now available at receiving facility. Patient awake, alert. bp normal, hr 90s. Sat 98%.  Pt currently appears stable for transfer.    Lajean Saver, MD 11/05/17 2003

## 2017-11-05 NOTE — ED Notes (Signed)
[facility]ed [facility] for pt transport  

## 2017-11-05 NOTE — ED Provider Notes (Signed)
[place] EMERGENCY DEPARTMENT Provider Note   CSN: [id number] Arrival date & time: 11/05/17  2993     History   Chief Complaint Chief Complaint  Patient presents with  . Cough  . Emesis  . Fever     HPI  Blood pressure 121/79, pulse 100, temperature 99.4 F (37.4 C), temperature source Oral, resp. rate 19, height 5\' 10"  (1.778 m), weight 88.5 kg (195 lb), SpO2 98 %.  Gregory Allen is a 46 y.o. male medical history significant for kidney and pancreas transplant in 04/2017 at [facility] complaining of fever (T-max 102 yesterday), cough intermittently productive with rhinorrhea, sore throat, myalgia, intermittent emesis with no focal abdominal pain.  He states his urine has been very dark and he also has associated diarrhea.  Wife is sick with similar.  He has not had a flu shot this year he believes he cannot have a flu shot being a transplant patient.  Stateshe completed a Z-Pak approximately 1 week ago.  Kathyrn Lass Transplant RN Roxy Horseman  Transplant MD  Past Medical History:  Diagnosis Date  . Diabetes mellitus without complication [facility])     Patient Active Problem List   Diagnosis Date Noted  . BP (high blood pressure) 11/19/2015  . History of organ or tissue transplant 05/12/2015  . Essential (primary) hypertension 05/12/2015  . Combined fat and carbohydrate induced hyperlipemia 05/12/2015  . Type 1 diabetes mellitus with hyperglycemia ([place]) 05/12/2015  . Arthritis of hand, degenerative 09/08/2014  . Hypophosphatemia 04/24/2012  . Carpal bone fracture 01/17/2012  . Tarsal tunnel syndrome 01/17/2012  . Nonproliferative diabetic retinopathy ([place]) 02/08/2011  . Type 1 diabetes mellitus ([place]) 02/08/2011  . Diabetic polyneuropathy ([place]) 02/08/2011  . Abnormal presence of protein in urine 02/08/2011    Past Surgical History:  Procedure Laterality Date  . TRANSPLANT PANCREATIC ALLOGRAFT         Home Medications     Prior to Admission medications   Medication Sig Start Date End Date Taking? Authorizing Provider  carvedilol (COREG) 12.5 MG tablet Take 12.5 mg by mouth 2 (two) times daily with a meal.    [provider]  cilostazol (PLETAL) 100 MG tablet Take 1 tablet (100 mg total) by mouth 2 (two) times daily before a meal. 02/17/17   Waynetta Sandy, MD  gabapentin (NEURONTIN) 100 MG capsule Take 100 mg by mouth 2 (two) times daily. Pt takes 2 tablets twice a day    [provider]  glucagon (GLUCAGON EMERGENCY) 1 MG injection Inject 1 mg into the vein once as needed.     [provider]  glucose blood (FREESTYLE TEST STRIPS) test strip To be tested 4 times daily 02/14/12   [provider]  hydrALAZINE (APRESOLINE) 25 MG tablet Take 25 mg by mouth.    [provider]  HYDROcodone-acetaminophen (NORCO/VICODIN) 5-325 MG tablet Take 1 tablet by mouth every 6 (six) hours as needed for moderate pain. 03/18/16   Wallene Huh, DPM  insulin aspart (NOVOLOG) 100 UNIT/ML injection As directed, using insulin pump. 1:30 carb ratio 07/18/12   [provider]  insulin detemir (LEVEMIR) 100 UNIT/ML injection Inject 18 Units into the skin at bedtime.    [provider]  Insulin Pen Needle (B-D ULTRAFINE III SHORT PEN) 31G X 8 MM MISC 1 each by Does not apply route. 07/18/12   [provider]  Insulin Syringe-Needle U-100 (B-D INSULIN SYRINGE 1CC/25GX1") 25G X 1" 1 ML MISC Use  as directed 07/19/12   [provider]  linaclotide Rolan Lipa) 145 MCG CAPS capsule Take 145 mcg by mouth daily as needed.    [provider]  mycophenolate (MYFORTIC) 360 MG TBEC EC tablet Take 360 mg by mouth 2 (two) times daily. 02/12/17   [provider]  NONFORMULARY OR COMPOUNDED [place] compound:  Peripheral Neuropathy cream - Bupivacaine 1%, doxepin 3%, Gabapentin 6%, Pentoxifylline 3%, Topiramate 1%, dispense 180gm, apply 1-2  grams to affected area 3-4 times daily, +3refills. 12/31/15   Wallene Huh, DPM  ondansetron (ZOFRAN ODT) 8 MG disintegrating tablet Take 1 tablet (8 mg total) by mouth every 8 (eight) hours as needed for nausea or vomiting. 02/13/17   Lajean Saver, MD  tacrolimus (PROGRAF) 0.5 MG capsule Take 0.5 mg by mouth 2 (two) times daily. Takes with 4mg  = 4.5mg  01/11/17   [provider]  tacrolimus (PROGRAF) 1 MG capsule Take 4 mg by mouth 2 (two) times daily. Takes with 0.5mg  = 4.5mg   02/12/17   [provider]    Family History No family history on file.  Social History Social History   Tobacco Use  . Smoking status: Never Smoker  . Smokeless tobacco: Never Used  Substance Use Topics  . Alcohol use: No    Alcohol/week: 0.0 oz  . Drug use: No     Allergies   Shellfish-derived products; Hydrocodone-acetaminophen; Other; and Shellfish allergy   Review of Systems Review of Systems  A complete review of systems was obtained and all systems are negative except as noted in the HPI and PMH.   Physical Exam Updated Vital Signs BP 117/67   Pulse 96   Temp 99.4 F (37.4 C) (Oral)   Resp 18   Ht 5\' 10"  (1.778 m)   Wt 88.5 kg (195 lb)   SpO2 98%   BMI 27.98 kg/m   Physical Exam  Constitutional: He is oriented to person, place, and time. He appears well-developed and well-nourished. No distress.  HENT:  Head: Normocephalic and atraumatic.  Mouth/Throat: Oropharynx is clear and moist.  Eyes: Conjunctivae and EOM are normal. Pupils are equal, round, and reactive to light.  Neck: Normal range of motion.  Cardiovascular: Normal rate, regular rhythm and intact distal pulses.  Pulmonary/Chest: Effort normal and breath sounds normal.  Abdominal: Soft. There is no tenderness.  Musculoskeletal: Normal range of motion.  Neurological: He is alert and oriented to person, place, and time.  Skin: He is not diaphoretic.  Psychiatric: He has a normal mood and affect.  Nursing  note and vitals reviewed.    ED Treatments / Results  Labs (all labs ordered are listed, but only abnormal results are displayed) Labs Reviewed  COMPREHENSIVE METABOLIC PANEL - Abnormal; Notable for the following components:      Result Value   Sodium 133 (*)    CO2 18 (*)    Glucose, Bld 132 (*)    Creatinine, Ser 1.68 (*)    GFR calc non Af Amer 47 (*)    GFR calc Af Amer 55 (*)    All other components within normal limits  CBC - Abnormal; Notable for the following components:   Hemoglobin 12.9 (*)    MCH 24.8 (*)    All other components within normal limits  URINALYSIS, ROUTINE W REFLEX MICROSCOPIC - Abnormal; Notable for the following components:   APPearance HAZY (*)    Protein, ur 30 (*)    Squamous Epithelial / LPF 0-5 (*)  All other components within normal limits  CULTURE, BLOOD (ROUTINE X 2)  CULTURE, BLOOD (ROUTINE X 2)  URINE CULTURE  LIPASE, BLOOD  INFLUENZA PANEL BY PCR (TYPE A & B)  I-STAT CG4 LACTIC ACID, ED  I-STAT CG4 LACTIC ACID, ED    EKG  EKG Interpretation None       Radiology Dg Chest 2 View  Result Date: 11/05/2017 CLINICAL DATA:  Cough EXAM: CHEST  2 VIEW COMPARISON:  10/13/2016 chest radiograph. FINDINGS: Embolization coils are noted in the right upper abdomen. Stable cardiomediastinal silhouette with normal heart size. No pneumothorax. No pleural effusion. Mild patchy consolidation in the left lower lobe is new. No pulmonary edema. IMPRESSION: New mild patchy left lower lobe consolidation suggestive of a pneumonia. Recommend follow-up PA and lateral post treatment chest radiographs in 4-6 weeks. Electronically Signed   By: Ilona Sorrel M.D.   On: 11/05/2017 12:48    Procedures Procedures (including critical care time)  Medications Ordered in ED Medications  vancomycin (VANCOCIN) 1,500 mg in sodium chloride 0.9 % 500 mL IVPB (not administered)  piperacillin-tazobactam (ZOSYN) IVPB 3.375 g (not administered)  sodium chloride 0.9 %  bolus 1,000 mL (0 mLs Intravenous Stopped 11/05/17 1424)     Initial Impression / Assessment and Plan / ED Course  I have reviewed the triage vital signs and the nursing notes.  Pertinent labs & imaging results that were available during my care of the patient were reviewed by me and considered in my medical decision making (see chart for details).    Vitals:   11/05/17 1345 11/05/17 1400 11/05/17 1415 11/05/17 1430  BP: (!) 150/86 (!) 142/83 137/84 117/67  Pulse: 99 98 98 96  Resp:      Temp:      TempSrc:      SpO2: 99% 98% 98% 98%  Weight:      Height:        Medications  vancomycin (VANCOCIN) 1,500 mg in sodium chloride 0.9 % 500 mL IVPB (not administered)  piperacillin-tazobactam (ZOSYN) IVPB 3.375 g (not administered)  sodium chloride 0.9 % bolus 1,000 mL (0 mLs Intravenous Stopped 11/05/17 1424)    Gregory Allen is 46 y.o. male presenting with flulike illness onset several days ago.  He fever of 102 yesterday.  He has had intermittent emesis with no focal abdominal pain.  He states that he has been able to keep down his antirejection meds.  Patient afebrile and well-appearing in the ED, no leukocytosis and lactic acid is normal, patient is pancultured, chest x-ray shows an infiltrate.  Discussed case with Dr. Linward Foster of the transplant team at [facility] health, he recommends transfer for treatment at their facility.  Recommends starting broad-spectrum antibiotics, Vanco and Zosyn per pharmacy consult.  Patient amenable to transplant.    Final Clinical Impressions(s) / ED Diagnoses   Final diagnoses:  None    ED Discharge Orders    None       Waynetta Pean 11/05/17 1443    Charlesetta Shanks, MD 11/10/17 [phone]

## 2017-11-05 NOTE — ED Notes (Signed)
Spoke with [facility] about transfer, the patient does not have a bed yet.

## 2017-11-06 LAB — URINE CULTURE: CULTURE: NO GROWTH

## 2017-11-10 ENCOUNTER — Other Ambulatory Visit: Payer: Self-pay | Admitting: *Deleted

## 2017-11-10 ENCOUNTER — Encounter: Payer: Self-pay | Admitting: *Deleted

## 2017-11-10 DIAGNOSIS — Z7952 Long term (current) use of systemic steroids: Secondary | ICD-10-CM | POA: Diagnosis not present

## 2017-11-10 DIAGNOSIS — Z79899 Other long term (current) drug therapy: Secondary | ICD-10-CM | POA: Diagnosis not present

## 2017-11-10 DIAGNOSIS — J069 Acute upper respiratory infection, unspecified: Secondary | ICD-10-CM | POA: Diagnosis not present

## 2017-11-10 DIAGNOSIS — D899 Disorder involving the immune mechanism, unspecified: Secondary | ICD-10-CM | POA: Diagnosis not present

## 2017-11-10 DIAGNOSIS — Z01818 Encounter for other preprocedural examination: Secondary | ICD-10-CM | POA: Diagnosis not present

## 2017-11-10 DIAGNOSIS — K219 Gastro-esophageal reflux disease without esophagitis: Secondary | ICD-10-CM | POA: Diagnosis not present

## 2017-11-10 DIAGNOSIS — B181 Chronic viral hepatitis B without delta-agent: Secondary | ICD-10-CM | POA: Diagnosis not present

## 2017-11-10 DIAGNOSIS — Z940 Kidney transplant status: Secondary | ICD-10-CM | POA: Diagnosis not present

## 2017-11-10 DIAGNOSIS — K5900 Constipation, unspecified: Secondary | ICD-10-CM | POA: Diagnosis not present

## 2017-11-10 DIAGNOSIS — Z792 Long term (current) use of antibiotics: Secondary | ICD-10-CM | POA: Diagnosis not present

## 2017-11-10 DIAGNOSIS — D649 Anemia, unspecified: Secondary | ICD-10-CM | POA: Diagnosis not present

## 2017-11-10 DIAGNOSIS — Z48288 Encounter for aftercare following multiple organ transplant: Secondary | ICD-10-CM | POA: Diagnosis not present

## 2017-11-10 DIAGNOSIS — E1042 Type 1 diabetes mellitus with diabetic polyneuropathy: Secondary | ICD-10-CM | POA: Diagnosis not present

## 2017-11-10 DIAGNOSIS — I151 Hypertension secondary to other renal disorders: Secondary | ICD-10-CM | POA: Diagnosis not present

## 2017-11-10 DIAGNOSIS — I10 Essential (primary) hypertension: Secondary | ICD-10-CM | POA: Diagnosis not present

## 2017-11-10 DIAGNOSIS — Z9483 Pancreas transplant status: Secondary | ICD-10-CM | POA: Diagnosis not present

## 2017-11-10 DIAGNOSIS — M25511 Pain in right shoulder: Secondary | ICD-10-CM | POA: Diagnosis not present

## 2017-11-10 DIAGNOSIS — N2889 Other specified disorders of kidney and ureter: Secondary | ICD-10-CM | POA: Diagnosis not present

## 2017-11-10 LAB — CULTURE, BLOOD (ROUTINE X 2)
CULTURE: NO GROWTH
Culture: NO GROWTH
Special Requests: ADEQUATE
Special Requests: ADEQUATE

## 2017-11-10 NOTE — Patient Outreach (Signed)
[facility]) Care Management  11/10/2017  Gregory Allen 04-08-1971 [id number]   Subjective: Telephone call to patient's home / mobile number, spoke with patient, and HIPAA verified.  Discussed [facility] Care Management [facility] Transition of care follow up, patient voiced understanding, and is in agreement to follow up.    Patient states he is doing well, follow up appointment with transplant MD earlier today, and appointment went well.  Patient gave verbal authorization to speak with wife Gregory Allen) regarding healthcare needs as needed.   Patient placed call on speaker phone, spoke with patient, and wife.   Wife states she has only been a Furniture conservator/restorer for a short period of time and is pleased to hear about the available services . Patient states he is able to manage self care and has assistance as needed.   Patient voices understanding of medical diagnosis and treatment plan.    States he is accessing the following [facility] benefits: outpatient pharmacy (currently using outpatient pharmacy at [facility] hospital, discussed [facility] outpatient pharmacy, states they will consider transferring his medications, if appropriate),  hospital indemnity, and wife has family medical leave act (FMLA) in place.   Patient states he does not have any education material, transition of care, care coordination, disease management, disease monitoring, transportation, community resource, or pharmacy needs at this time.   States he may be interested in enrolling in [place] [facility] ([email or web address] for diabetes, RNCM advised will send website in successful outreach letter.     States he is very appreciative of the follow up and is in agreement to receive [place] [facility] information.       Objective:Per KPN (Knowledge Performance Now, point of care tool) and chart review, patient hospitalized 11/05/17 -11/07/17 for Fever in Kidney-Pancreas Recipient.     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    Status post kidney-pancreas transplant for CKD secondary to type 1 diabetes on 04/07/17.        Assessment: Received [facility] TOC transition of care referral on 11/09/17.    Transition of care follow up completed, no care management needs, and will proceed with case closure.       Plan: RNCM will send patient successful outreach letter and magnet. RNCM will send case closure due to follow up completed / no care management needs request to [facility].       Gregory Allen H. Annia Friendly, BSN, [place] [facility] Telephonic CM Phone: [phone] Fax: [phone]

## 2017-11-14 DIAGNOSIS — Z6829 Body mass index (BMI) 29.0-29.9, adult: Secondary | ICD-10-CM | POA: Diagnosis not present

## 2017-11-14 DIAGNOSIS — Z9483 Pancreas transplant status: Secondary | ICD-10-CM | POA: Diagnosis not present

## 2017-11-14 DIAGNOSIS — R05 Cough: Secondary | ICD-10-CM | POA: Diagnosis not present

## 2017-11-14 DIAGNOSIS — D8989 Other specified disorders involving the immune mechanism, not elsewhere classified: Secondary | ICD-10-CM | POA: Diagnosis not present

## 2017-11-14 DIAGNOSIS — R918 Other nonspecific abnormal finding of lung field: Secondary | ICD-10-CM | POA: Diagnosis not present

## 2017-11-14 DIAGNOSIS — Z8639 Personal history of other endocrine, nutritional and metabolic disease: Secondary | ICD-10-CM | POA: Diagnosis not present

## 2017-11-14 DIAGNOSIS — J111 Influenza due to unidentified influenza virus with other respiratory manifestations: Secondary | ICD-10-CM | POA: Diagnosis not present

## 2017-11-14 DIAGNOSIS — R634 Abnormal weight loss: Secondary | ICD-10-CM | POA: Diagnosis not present

## 2017-11-16 DIAGNOSIS — Z9483 Pancreas transplant status: Secondary | ICD-10-CM | POA: Diagnosis not present

## 2017-11-16 DIAGNOSIS — Z940 Kidney transplant status: Secondary | ICD-10-CM | POA: Diagnosis not present

## 2017-12-08 DIAGNOSIS — Z792 Long term (current) use of antibiotics: Secondary | ICD-10-CM | POA: Diagnosis not present

## 2017-12-08 DIAGNOSIS — Z9483 Pancreas transplant status: Secondary | ICD-10-CM | POA: Diagnosis not present

## 2017-12-08 DIAGNOSIS — Z48288 Encounter for aftercare following multiple organ transplant: Secondary | ICD-10-CM | POA: Diagnosis not present

## 2017-12-08 DIAGNOSIS — E1140 Type 2 diabetes mellitus with diabetic neuropathy, unspecified: Secondary | ICD-10-CM | POA: Diagnosis not present

## 2017-12-08 DIAGNOSIS — K5900 Constipation, unspecified: Secondary | ICD-10-CM | POA: Diagnosis not present

## 2017-12-08 DIAGNOSIS — D8989 Other specified disorders involving the immune mechanism, not elsewhere classified: Secondary | ICD-10-CM | POA: Diagnosis not present

## 2017-12-08 DIAGNOSIS — G629 Polyneuropathy, unspecified: Secondary | ICD-10-CM | POA: Diagnosis not present

## 2017-12-08 DIAGNOSIS — I10 Essential (primary) hypertension: Secondary | ICD-10-CM | POA: Diagnosis not present

## 2017-12-08 DIAGNOSIS — Z79899 Other long term (current) drug therapy: Secondary | ICD-10-CM | POA: Diagnosis not present

## 2017-12-08 DIAGNOSIS — M25511 Pain in right shoulder: Secondary | ICD-10-CM | POA: Diagnosis not present

## 2017-12-08 DIAGNOSIS — Z9889 Other specified postprocedural states: Secondary | ICD-10-CM | POA: Diagnosis not present

## 2017-12-08 DIAGNOSIS — Z940 Kidney transplant status: Secondary | ICD-10-CM | POA: Diagnosis not present

## 2017-12-08 DIAGNOSIS — E109 Type 1 diabetes mellitus without complications: Secondary | ICD-10-CM | POA: Diagnosis not present

## 2017-12-08 DIAGNOSIS — K219 Gastro-esophageal reflux disease without esophagitis: Secondary | ICD-10-CM | POA: Diagnosis not present

## 2017-12-08 DIAGNOSIS — D649 Anemia, unspecified: Secondary | ICD-10-CM | POA: Diagnosis not present

## 2017-12-08 DIAGNOSIS — E1040 Type 1 diabetes mellitus with diabetic neuropathy, unspecified: Secondary | ICD-10-CM | POA: Diagnosis not present

## 2017-12-08 DIAGNOSIS — Z7952 Long term (current) use of systemic steroids: Secondary | ICD-10-CM | POA: Diagnosis not present

## 2017-12-14 DIAGNOSIS — R918 Other nonspecific abnormal finding of lung field: Secondary | ICD-10-CM | POA: Diagnosis not present

## 2017-12-14 DIAGNOSIS — Z940 Kidney transplant status: Secondary | ICD-10-CM | POA: Diagnosis not present

## 2017-12-14 DIAGNOSIS — Z9483 Pancreas transplant status: Secondary | ICD-10-CM | POA: Diagnosis not present

## 2018-01-05 DIAGNOSIS — Z79899 Other long term (current) drug therapy: Secondary | ICD-10-CM | POA: Diagnosis not present

## 2018-01-05 DIAGNOSIS — I10 Essential (primary) hypertension: Secondary | ICD-10-CM | POA: Diagnosis not present

## 2018-01-05 DIAGNOSIS — Z48288 Encounter for aftercare following multiple organ transplant: Secondary | ICD-10-CM | POA: Diagnosis not present

## 2018-01-05 DIAGNOSIS — Z792 Long term (current) use of antibiotics: Secondary | ICD-10-CM | POA: Diagnosis not present

## 2018-01-05 DIAGNOSIS — Z940 Kidney transplant status: Secondary | ICD-10-CM | POA: Diagnosis not present

## 2018-01-05 DIAGNOSIS — Z23 Encounter for immunization: Secondary | ICD-10-CM | POA: Diagnosis not present

## 2018-01-05 DIAGNOSIS — D8989 Other specified disorders involving the immune mechanism, not elsewhere classified: Secondary | ICD-10-CM | POA: Diagnosis not present

## 2018-01-05 DIAGNOSIS — Z9483 Pancreas transplant status: Secondary | ICD-10-CM | POA: Diagnosis not present

## 2018-01-05 DIAGNOSIS — G629 Polyneuropathy, unspecified: Secondary | ICD-10-CM | POA: Diagnosis not present

## 2018-01-05 DIAGNOSIS — E119 Type 2 diabetes mellitus without complications: Secondary | ICD-10-CM | POA: Diagnosis not present

## 2018-01-15 DIAGNOSIS — M546 Pain in thoracic spine: Secondary | ICD-10-CM | POA: Diagnosis not present

## 2018-01-15 DIAGNOSIS — M9903 Segmental and somatic dysfunction of lumbar region: Secondary | ICD-10-CM | POA: Diagnosis not present

## 2018-01-15 DIAGNOSIS — M9902 Segmental and somatic dysfunction of thoracic region: Secondary | ICD-10-CM | POA: Diagnosis not present

## 2018-01-15 DIAGNOSIS — M545 Low back pain: Secondary | ICD-10-CM | POA: Diagnosis not present

## 2018-01-23 DIAGNOSIS — M5116 Intervertebral disc disorders with radiculopathy, lumbar region: Secondary | ICD-10-CM | POA: Diagnosis not present

## 2018-01-23 DIAGNOSIS — M9905 Segmental and somatic dysfunction of pelvic region: Secondary | ICD-10-CM | POA: Diagnosis not present

## 2018-01-23 DIAGNOSIS — Q72812 Congenital shortening of left lower limb: Secondary | ICD-10-CM | POA: Diagnosis not present

## 2018-01-23 DIAGNOSIS — M9903 Segmental and somatic dysfunction of lumbar region: Secondary | ICD-10-CM | POA: Diagnosis not present

## 2018-02-02 DIAGNOSIS — N2889 Other specified disorders of kidney and ureter: Secondary | ICD-10-CM | POA: Diagnosis not present

## 2018-02-02 DIAGNOSIS — Z5181 Encounter for therapeutic drug level monitoring: Secondary | ICD-10-CM | POA: Diagnosis not present

## 2018-02-02 DIAGNOSIS — Z01818 Encounter for other preprocedural examination: Secondary | ICD-10-CM | POA: Diagnosis not present

## 2018-02-02 DIAGNOSIS — Z79899 Other long term (current) drug therapy: Secondary | ICD-10-CM | POA: Diagnosis not present

## 2018-02-02 DIAGNOSIS — Z940 Kidney transplant status: Secondary | ICD-10-CM | POA: Diagnosis not present

## 2018-02-02 DIAGNOSIS — E1143 Type 2 diabetes mellitus with diabetic autonomic (poly)neuropathy: Secondary | ICD-10-CM | POA: Diagnosis not present

## 2018-02-02 DIAGNOSIS — N141 Nephropathy induced by other drugs, medicaments and biological substances: Secondary | ICD-10-CM | POA: Diagnosis not present

## 2018-02-02 DIAGNOSIS — K5901 Slow transit constipation: Secondary | ICD-10-CM | POA: Diagnosis not present

## 2018-02-02 DIAGNOSIS — D899 Disorder involving the immune mechanism, unspecified: Secondary | ICD-10-CM | POA: Diagnosis not present

## 2018-02-02 DIAGNOSIS — Z48288 Encounter for aftercare following multiple organ transplant: Secondary | ICD-10-CM | POA: Diagnosis not present

## 2018-02-02 DIAGNOSIS — I10 Essential (primary) hypertension: Secondary | ICD-10-CM | POA: Diagnosis not present

## 2018-02-02 DIAGNOSIS — G629 Polyneuropathy, unspecified: Secondary | ICD-10-CM | POA: Diagnosis not present

## 2018-02-02 DIAGNOSIS — Z9483 Pancreas transplant status: Secondary | ICD-10-CM | POA: Diagnosis not present

## 2018-02-02 DIAGNOSIS — I151 Hypertension secondary to other renal disorders: Secondary | ICD-10-CM | POA: Diagnosis not present

## 2018-02-02 DIAGNOSIS — G2581 Restless legs syndrome: Secondary | ICD-10-CM | POA: Diagnosis not present

## 2018-02-02 DIAGNOSIS — K5904 Chronic idiopathic constipation: Secondary | ICD-10-CM | POA: Diagnosis not present

## 2018-02-07 DIAGNOSIS — M47816 Spondylosis without myelopathy or radiculopathy, lumbar region: Secondary | ICD-10-CM | POA: Diagnosis not present

## 2018-02-15 DIAGNOSIS — R6889 Other general symptoms and signs: Secondary | ICD-10-CM | POA: Diagnosis not present

## 2018-02-22 ENCOUNTER — Encounter: Payer: Self-pay | Admitting: Podiatry

## 2018-02-22 ENCOUNTER — Ambulatory Visit (INDEPENDENT_AMBULATORY_CARE_PROVIDER_SITE_OTHER): Payer: Medicare Other

## 2018-02-22 ENCOUNTER — Ambulatory Visit (INDEPENDENT_AMBULATORY_CARE_PROVIDER_SITE_OTHER): Payer: Medicare Other | Admitting: Podiatry

## 2018-02-22 DIAGNOSIS — M722 Plantar fascial fibromatosis: Secondary | ICD-10-CM | POA: Diagnosis not present

## 2018-02-22 MED ORDER — TRIAMCINOLONE ACETONIDE 10 MG/ML IJ SUSP
10.0000 mg | Freq: Once | INTRAMUSCULAR | Status: AC
  Administered 2018-02-22: 10 mg

## 2018-02-22 NOTE — Progress Notes (Signed)
Subjective:   Patient ID: Gregory Allen, male   DOB: 47 y.o.   MRN: [id number]   HPI Patient states the bottoms of my heels of started bother me a lot is normal near as bad as before surgery on the left but I am noticing it   ROS      Objective:  Physical Exam  Neurovascular status intact with acute inflammation plantar aspect heel region bilateral     Assessment:  Acute plantar fasciitis bilateral     Plan:  Careful plantar injection administered bilateral 3 mg Kenalog 5 mg Xylocaine and reappoint to recheck and may ultimately require other treatments depending on response

## 2018-02-23 MED FILL — AMLODIPINE BESYLATE 10 MG T: 10 | 90 days supply | Qty: 90 | Fill #0

## 2018-02-23 MED FILL — SULFAMETHOXAZOLE/TMP SS TAB: 400-80 | 28 days supply | Qty: 12 | Fill #0

## 2018-02-23 MED FILL — GABAPENTIN 100 MG CAP: 100 | 30 days supply | Qty: 180 | Fill #0

## 2018-02-23 MED FILL — TACROLIMUS 1 MG CAPSULE: 1 | 30 days supply | Qty: 240 | Fill #0

## 2018-02-23 MED FILL — CARVEDILOL 12.5 MG TABLET: 12.5 | 30 days supply | Qty: 60 | Fill #0

## 2018-03-02 DIAGNOSIS — G629 Polyneuropathy, unspecified: Secondary | ICD-10-CM | POA: Diagnosis not present

## 2018-03-02 DIAGNOSIS — Z79899 Other long term (current) drug therapy: Secondary | ICD-10-CM | POA: Diagnosis not present

## 2018-03-02 DIAGNOSIS — Z48288 Encounter for aftercare following multiple organ transplant: Secondary | ICD-10-CM | POA: Diagnosis not present

## 2018-03-02 DIAGNOSIS — Z940 Kidney transplant status: Secondary | ICD-10-CM | POA: Diagnosis not present

## 2018-03-02 DIAGNOSIS — D899 Disorder involving the immune mechanism, unspecified: Secondary | ICD-10-CM | POA: Diagnosis not present

## 2018-03-02 DIAGNOSIS — Z9483 Pancreas transplant status: Secondary | ICD-10-CM | POA: Diagnosis not present

## 2018-03-02 DIAGNOSIS — N141 Nephropathy induced by other drugs, medicaments and biological substances: Secondary | ICD-10-CM | POA: Diagnosis not present

## 2018-03-02 DIAGNOSIS — T451X5A Adverse effect of antineoplastic and immunosuppressive drugs, initial encounter: Secondary | ICD-10-CM | POA: Diagnosis not present

## 2018-03-02 MED FILL — GABAPENTIN 600 MG TAB: 600 | 30 days supply | Qty: 90 | Fill #0

## 2018-03-08 ENCOUNTER — Ambulatory Visit (INDEPENDENT_AMBULATORY_CARE_PROVIDER_SITE_OTHER): Payer: 59 | Admitting: Family

## 2018-03-08 ENCOUNTER — Ambulatory Visit (HOSPITAL_COMMUNITY)
Admission: RE | Admit: 2018-03-08 | Discharge: 2018-03-08 | Disposition: A | Discharge status: Home / Self Care | Payer: 59 | Source: Ambulatory Visit | Attending: Family | Admitting: Family

## 2018-03-08 ENCOUNTER — Encounter: Payer: Self-pay | Admitting: Family

## 2018-03-08 VITALS — BP 115/76 | HR 76 | Temp 97.3°F | Resp 18 | Ht 70.0 in | Wt 197.0 lb

## 2018-03-08 DIAGNOSIS — Z8639 Personal history of other endocrine, nutritional and metabolic disease: Secondary | ICD-10-CM

## 2018-03-08 DIAGNOSIS — Z9483 Pancreas transplant status: Secondary | ICD-10-CM | POA: Diagnosis not present

## 2018-03-08 DIAGNOSIS — I779 Disorder of arteries and arterioles, unspecified: Secondary | ICD-10-CM

## 2018-03-08 DIAGNOSIS — I70219 Atherosclerosis of native arteries of extremities with intermittent claudication, unspecified extremity: Secondary | ICD-10-CM | POA: Diagnosis not present

## 2018-03-08 DIAGNOSIS — Z940 Kidney transplant status: Secondary | ICD-10-CM

## 2018-03-08 NOTE — Progress Notes (Signed)
[facility] [place]   CC: Follow up peripheral artery occlusive disease  History of Present Illness Gregory Allen is a 47 y.o. male initially evaluated by Dr. Donzetta Matters on 02-17-17 for bilateral lower extremity burning in his calf with associated cramping.   From an upper body standpoint he is very healthy and lifts weights without issue.  He has never had a stroke or cardiac issues.   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  He has associated leg swelling occasionally and it and left lateral thigh pain. He does not have tissue loss or ulceration. He does take daily aspirin. At the initial visit it appeared that his pain was multifactorial in nature in his bilateral lower extremities from possible back pain given the hip component as well. He had near normal ABIs although monophasic signals from likely calcified vessels from his long-term diabetes. Dr. Donzetta Matters discussed intervention versus conservative management with addition of Pletal which is the route he wanted to go at that time. Dr. Donzetta Matters advised follow up in 6 months with repeat ABI with toe pressures.   He exercises daily in a gym, cycles, uses elliptical.  He reports pain in both calves walking or not, right more so than left, but is worse with walking. 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   He states the Ptetal did help ease the walking pain in his legs.   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   He denies any known hx of stroke or TIA.  Pt is taking cilostizol 100 mg bid prescribed by his PCP.   Pt Diabetic: Yes, diagnosed at age 22, until pancreas and renal transplant in June 2018 Pt smoker: non-smoker  Pt meds include: Statin :No, states his  cholesterol is good Betablocker: yes ASA: Yes Other anticoagulants/antiplatelets: no     Past Medical History:  Diagnosis Date  . Diabetes mellitus without complication [facility])     Social History Social History   Tobacco Use  . Smoking status: Never Smoker  . Smokeless tobacco: Never Used  Substance Use Topics  . Alcohol use: No    Alcohol/week: 0.0 oz  . Drug use: No    Family History History reviewed. No pertinent family history.  Past Surgical History:  Procedure Laterality Date  . TRANSPLANT PANCREATIC ALLOGRAFT      Allergies  Allergen Reactions  . Shellfish-Derived Products Itching    "throat starts itching" pt states "he is ok with iodine"  . Other Other (See Comments)    Shellfish per pt  . Shellfish Allergy     Current Outpatient Medications  Medication Sig Dispense Refill  . carvedilol (COREG) 12.5 MG tablet Take 12.5 mg by mouth 2 (two) times daily with a meal.    . gabapentin (NEURONTIN) 100 MG capsule Take 100 mg by mouth 2 (two) times daily. Pt takes 2 tablets twice a day    . mycophenolate (MYFORTIC) 360 MG TBEC EC tablet Take 360 mg by mouth 2 (two) times daily.    . NONFORMULARY OR COMPOUNDED [place] compound:  Peripheral Neuropathy cream - Bupivacaine 1%, doxepin 3%, Gabapentin 6%, Pentoxifylline 3%, Topiramate 1%, dispense 180gm, apply 1-2 grams to affected area 3-4  times daily, +3refills. 180 each 3  . tacrolimus (PROGRAF) 0.5 MG capsule Take 0.5 mg by mouth 2 (two) times daily. Takes with 4mg  = 4.5mg     . tacrolimus (PROGRAF) 1 MG capsule Take 4 mg by mouth 2 (two) times daily. Takes with 0.5mg  = 4.5mg       No current facility-administered medications for this visit.     ROS: See HPI for pertinent positives and negatives.   Physical Examination  Vitals:   03/08/18 0927  BP: 115/76  Pulse: 76  Resp: 18  Temp: (!) 97.3 F (36.3 C)  TempSrc: Oral  SpO2: 98%  Weight: 197 lb (89.4 kg)  Height: 5\' 10"  (1.778 m)   Body  mass index is 28.27 kg/m.  General: A&O x 3, WDWN, fit appearing male. Gait: normal HENT: No gross abnormalities  Eyes: PERRLA. Pulmonary: Respirations are non labored, CTAB, good air movement Cardiac: regular rhythm, no detected murmur.        Carotid Bruits Right Left   Negative Negative   Radial pulses are 2+ palpable bilaterally   Adominal aortic pulse is not palpable                         VASCULAR EXAM: Extremities without ischemic changes, without Gangrene; without open wounds.                                                                                                                                                       LE Pulses Right Left       FEMORAL  2+ palpable  2+ palpable        POPLITEAL  not palpable   not palpable       POSTERIOR TIBIAL  not palpable   not palpable        DORSALIS PEDIS      ANTERIOR TIBIAL not palpable  not palpable    Abdomen: soft, NT, no palpable masses. Long well healed midline incision.  Skin: no rashes, no ulcers noted. Musculoskeletal: no muscle wasting or atrophy.      Skin: no rashes, no cellulitis, no ulcers noted. Neurologic: A&O X 3; appropriate affect, Sensation is normal; MOTOR FUNCTION:  moving all extremities equally, motor strength 5/5 throughout. Speech is fluent/normal. CN 2-12 intact. Psychiatric: Thought content is normal, mood appropriate for clinical situation.     ASSESSMENT: Gregory Allen is a 47 y.o. male who presents with pain in both legs at rest, but primarily with walking. He also has a history of lumbar spine surgery.  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   The pain in his legs could be multifactorial with a hx of lumbar spine surgery and possible neuropathic pain secondary to hx of Type  1 DM for 40 years.   However, right LE waveforms have declined from tri and bi to monophasic, left remains normal with biphasic  waveforms.  Right leg is more symptomatic than left.  Bilateral femoral pulses are 2+ palpable, no palpable pulses distal to this.   I discussed with Dr. Oneida Alar pt HPI, exam results, and results of ABI's today; see Plan.  Pt's last serum creatinine result on file was 1.68 on 11-05-17.    DATA  ABI (Date: 03/08/2018):  R:   ABI: 0.84 (was 0.94 on 08-25-17),   PT: mono (was bi)  DP: mono (was tri)  TBI:  0.66 (was 0.59)  L:   ABI: 1.09 (was 0.88),   PT: bi  DP: bi  TBI: 0.92 (was 1.02)   Right LE waveforms have declined from tri and bi to monophasic, left remains normal with biphasic waveforms.    PLAN:  Based on the patient's vascular studies and examination, pt will return to clinic in 2 weeks with bilateral LE arterial duplex, see Dr. Donzetta Matters, to discuss results and whether an arterial intervention would benefit his calf pain.   I advised pt to notify us if he develops concerns re the circulation in his feet or legs.    I discussed in depth with the patient the nature of atherosclerosis, and emphasized the importance of maximal medical management including strict control of blood pressure, blood glucose, and lipid levels, obtaining regular exercise, and continued cessation of smoking.  The patient is aware that without maximal medical management the underlying atherosclerotic disease process will progress, limiting the benefit of any interventions.  The patient was given information about PAD including signs, symptoms, treatment, what symptoms should prompt the patient to seek immediate medical care, and risk reduction measures to take.  Clemon Chambers, RN, MSN, FNP-C [facility] [employer] Phone: [phone]  Clinic MD: [facility]  03/08/18 9:54 AM

## 2018-03-08 NOTE — Patient Instructions (Signed)

## 2018-03-09 ENCOUNTER — Other Ambulatory Visit: Payer: Self-pay

## 2018-03-09 DIAGNOSIS — I779 Disorder of arteries and arterioles, unspecified: Secondary | ICD-10-CM

## 2018-03-12 MED FILL — predniSONE 5 MG TABS: 5 | 30 days supply | Qty: 30 | Fill #0

## 2018-03-14 DIAGNOSIS — R82998 Other abnormal findings in urine: Secondary | ICD-10-CM | POA: Diagnosis not present

## 2018-03-14 DIAGNOSIS — Z125 Encounter for screening for malignant neoplasm of prostate: Secondary | ICD-10-CM | POA: Diagnosis not present

## 2018-03-14 DIAGNOSIS — I10 Essential (primary) hypertension: Secondary | ICD-10-CM | POA: Diagnosis not present

## 2018-03-14 DIAGNOSIS — E1021 Type 1 diabetes mellitus with diabetic nephropathy: Secondary | ICD-10-CM | POA: Diagnosis not present

## 2018-03-20 DIAGNOSIS — I7389 Other specified peripheral vascular diseases: Secondary | ICD-10-CM | POA: Diagnosis not present

## 2018-03-20 DIAGNOSIS — Z9483 Pancreas transplant status: Secondary | ICD-10-CM | POA: Diagnosis not present

## 2018-03-20 DIAGNOSIS — M545 Low back pain: Secondary | ICD-10-CM | POA: Diagnosis not present

## 2018-03-20 DIAGNOSIS — D8989 Other specified disorders involving the immune mechanism, not elsewhere classified: Secondary | ICD-10-CM | POA: Diagnosis not present

## 2018-03-20 DIAGNOSIS — E109 Type 1 diabetes mellitus without complications: Secondary | ICD-10-CM | POA: Diagnosis not present

## 2018-03-20 DIAGNOSIS — M722 Plantar fascial fibromatosis: Secondary | ICD-10-CM | POA: Diagnosis not present

## 2018-03-20 DIAGNOSIS — I10 Essential (primary) hypertension: Secondary | ICD-10-CM | POA: Diagnosis not present

## 2018-03-20 DIAGNOSIS — Z6829 Body mass index (BMI) 29.0-29.9, adult: Secondary | ICD-10-CM | POA: Diagnosis not present

## 2018-03-20 DIAGNOSIS — Z1389 Encounter for screening for other disorder: Secondary | ICD-10-CM | POA: Diagnosis not present

## 2018-03-20 DIAGNOSIS — Z0000 Encounter for general adult medical examination without abnormal findings: Secondary | ICD-10-CM | POA: Diagnosis not present

## 2018-03-21 ENCOUNTER — Ambulatory Visit (HOSPITAL_COMMUNITY)
Admission: RE | Admit: 2018-03-21 | Discharge: 2018-03-21 | Disposition: A | Discharge status: Home / Self Care | Payer: 59 | Source: Ambulatory Visit | Attending: Vascular Surgery | Admitting: Vascular Surgery

## 2018-03-21 DIAGNOSIS — E119 Type 2 diabetes mellitus without complications: Secondary | ICD-10-CM | POA: Insufficient documentation

## 2018-03-21 DIAGNOSIS — I779 Disorder of arteries and arterioles, unspecified: Secondary | ICD-10-CM | POA: Insufficient documentation

## 2018-03-21 DIAGNOSIS — I10 Essential (primary) hypertension: Secondary | ICD-10-CM | POA: Diagnosis not present

## 2018-03-23 ENCOUNTER — Other Ambulatory Visit: Payer: Self-pay

## 2018-03-23 ENCOUNTER — Encounter: Payer: Self-pay | Admitting: Vascular Surgery

## 2018-03-23 ENCOUNTER — Ambulatory Visit (INDEPENDENT_AMBULATORY_CARE_PROVIDER_SITE_OTHER): Payer: 59 | Admitting: Vascular Surgery

## 2018-03-23 ENCOUNTER — Ambulatory Visit: Payer: Medicare Other | Admitting: Vascular Surgery

## 2018-03-23 VITALS — BP 121/77 | HR 84 | Temp 98.3°F | Resp 16 | Ht 70.0 in | Wt 195.0 lb

## 2018-03-23 DIAGNOSIS — I779 Disorder of arteries and arterioles, unspecified: Secondary | ICD-10-CM | POA: Diagnosis not present

## 2018-03-23 DIAGNOSIS — Z8639 Personal history of other endocrine, nutritional and metabolic disease: Secondary | ICD-10-CM | POA: Diagnosis not present

## 2018-03-23 MED ORDER — LIDOCAINE-PRILOCAINE 2.5-2.5 % EX CREA: TOPICAL_CREAM | Freq: Every day | CUTANEOUS | Status: AC

## 2018-03-23 NOTE — Progress Notes (Signed)
Patient ID: Gregory Allen, male   DOB: 1971/07/23, 47 y.o.   MRN: [id number]  Reason for Consult: PAD (2 wk f/u with LE Art Bilat 03/08/18.)   Referred by Velna Hatchet, MD  Subjective:     HPI:  Gregory Allen is a 47 y.o. 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  Since that time he has not required insulin.  He is a non-smoker.  He is supposed to take aspirin but does not.  He has bilateral lower extremity pain mostly in his heels on the left more than the right.  He has tried multiple over-the-counter as well as prescription medications for his heels to no avail.  He does not have any pain in his calves or up his legs but states that is healed to get worse with activity.  He does exercise daily in the gym and is able to use the elliptical without any exacerbation in his legs but again this does hurt his heels.  He has no tissue loss or ulceration.  Was given Pletal which did help his legs a little bit in the past.  Past Medical History:  Diagnosis Date  . Diabetes mellitus without complication ([place])    No family history on file. Past Surgical History:  Procedure Laterality Date  . TRANSPLANT PANCREATIC ALLOGRAFT      Short Social History:  Social History   Tobacco Use  . Smoking status: Never Smoker  . Smokeless tobacco: Never Used  Substance Use Topics  . Alcohol use: No    Alcohol/week: 0.0 oz    Allergies  Allergen Reactions  . Shellfish-Derived Products Itching    "throat starts itching" pt states "he is ok with iodine"  . Other Other (See Comments)    Shellfish per pt  . Shellfish Allergy     Current Outpatient Medications  Medication Sig Dispense Refill  . carvedilol (COREG) 12.5 MG tablet Take 12.5 mg by mouth 2 (two) times daily with a meal.    . gabapentin (NEURONTIN) 100 MG capsule Take 100 mg by mouth 2 (two) times daily. Pt takes 2 tablets twice a day    . mycophenolate  (MYFORTIC) 360 MG TBEC EC tablet Take 360 mg by mouth 2 (two) times daily.    . NONFORMULARY OR COMPOUNDED [place] compound:  Peripheral Neuropathy cream - Bupivacaine 1%, doxepin 3%, Gabapentin 6%, Pentoxifylline 3%, Topiramate 1%, dispense 180gm, apply 1-2 grams to affected area 3-4 times daily, +3refills. 180 each 3  . tacrolimus (PROGRAF) 0.5 MG capsule Take 0.5 mg by mouth 2 (two) times daily. Takes with 4mg  = 4.5mg     . tacrolimus (PROGRAF) 1 MG capsule Take 4 mg by mouth 2 (two) times daily. Takes with 0.5mg  = 4.5mg       Current Facility-Administered Medications  Medication Dose Route Frequency Provider Last Rate Last Dose  . lidocaine-prilocaine (EMLA) cream   Topical Daily Waynetta Sandy, MD        Review of Systems  Constitutional:  Constitutional negative. HENT: HENT negative.  Eyes: Eyes negative.  GI: Gastrointestinal negative.  Musculoskeletal: Positive for leg pain.  Skin: Skin negative.  Hematologic: Hematologic/lymphatic negative.  Psychiatric: Psychiatric negative.        Objective:  Objective   Vitals:   03/23/18 1322  BP: 121/77  Pulse: 84  Resp: 16  Temp: 98.3 F (36.8 C)  TempSrc: Oral  SpO2: 98%  Weight: 195 lb (88.5 kg)  Height:  5\' 10"  (1.778 m)   Body mass index is 27.98 kg/m.  Physical Exam  Constitutional: He is oriented to person, place, and time. He appears well-developed.  HENT:  Head: Normocephalic.  Eyes: Pupils are equal, round, and reactive to light.  Neck: Normal range of motion.  Cardiovascular: Normal rate.  Nonpalpable dorsalis pedis and posterior tibials bilaterally Strong anterior tibial signal that is multiphasic on the left Right foot has monophasic dorsalis pedis and posterior tibial signals  Abdominal: Soft.  Well healed midline incision  Musculoskeletal: He exhibits no edema.  Neurological: He is alert and oriented to person, place, and time.  Skin: Skin is warm and dry. Capillary refill takes  less than 2 seconds.  Psychiatric: He has a normal mood and affect. His behavior is normal. Judgment and thought content normal.    Data: I reviewed his ABIs from previous which are 0.84 right and monophasic and left 1.09 biphasic with toe pressures on the right 94 and left 130  Assessment/Plan:     47 year old male with long-standing history of diabetes and has mostly heel pain left greater than right occasional leg pain.  He is not taking aspirin as instructed but he agrees to start taking baby aspirin daily.  From a heel pain perspective this does not appear to be vascular nature given that he does have toe pressures and good capillary refill bilaterally.  He would also be at risk for his recent kidney transplant if we are to perform angiography at this time we will hold on this for 1 year and follow him up with ABIs.  I have sent EMLA cream to the pharmacy and he is going to investigate CBD oils and creams as a possible alternative.  Should he have issues we can certainly see him sooner.  He states that he would probably want his vascular interventions done at [facility] given that that is where his transplant was done and I will happily send him there if vascular intervention is merited.     Waynetta Sandy MD [facility]

## 2018-03-26 DIAGNOSIS — M9904 Segmental and somatic dysfunction of sacral region: Secondary | ICD-10-CM | POA: Diagnosis not present

## 2018-03-26 DIAGNOSIS — M545 Low back pain: Secondary | ICD-10-CM | POA: Diagnosis not present

## 2018-03-26 DIAGNOSIS — M9903 Segmental and somatic dysfunction of lumbar region: Secondary | ICD-10-CM | POA: Diagnosis not present

## 2018-03-26 DIAGNOSIS — M9905 Segmental and somatic dysfunction of pelvic region: Secondary | ICD-10-CM | POA: Diagnosis not present

## 2018-03-26 MED ORDER — LIDOCAINE-PRILOCAINE 2.5-2.5 % EX CREA: 1.0000 "application " | TOPICAL_CREAM | CUTANEOUS | 0 refills | Status: DC | PRN

## 2018-03-26 MED FILL — LIDOCAINE-PRILOCAINE CREAM: 2.5-2.5 | 15 days supply | Qty: 30 | Fill #0

## 2018-03-26 NOTE — Addendum Note (Signed)
Addended by: Alice Rieger on: 03/26/2018 04:56 PM   Modules accepted: Orders

## 2018-03-30 DIAGNOSIS — I10 Essential (primary) hypertension: Secondary | ICD-10-CM | POA: Diagnosis not present

## 2018-03-30 DIAGNOSIS — Z7952 Long term (current) use of systemic steroids: Secondary | ICD-10-CM | POA: Diagnosis not present

## 2018-03-30 DIAGNOSIS — Z4822 Encounter for aftercare following kidney transplant: Secondary | ICD-10-CM | POA: Diagnosis not present

## 2018-03-30 DIAGNOSIS — Z79899 Other long term (current) drug therapy: Secondary | ICD-10-CM | POA: Diagnosis not present

## 2018-03-30 DIAGNOSIS — Z792 Long term (current) use of antibiotics: Secondary | ICD-10-CM | POA: Diagnosis not present

## 2018-04-03 DIAGNOSIS — M545 Low back pain: Secondary | ICD-10-CM | POA: Diagnosis not present

## 2018-04-03 DIAGNOSIS — M9903 Segmental and somatic dysfunction of lumbar region: Secondary | ICD-10-CM | POA: Diagnosis not present

## 2018-04-03 DIAGNOSIS — M9905 Segmental and somatic dysfunction of pelvic region: Secondary | ICD-10-CM | POA: Diagnosis not present

## 2018-04-03 DIAGNOSIS — M9904 Segmental and somatic dysfunction of sacral region: Secondary | ICD-10-CM | POA: Diagnosis not present

## 2018-04-03 MED FILL — SULFAMETHOXAZOLE-TMP SS TAB: 400-80 | 28 days supply | Qty: 12 | Fill #1

## 2018-04-03 MED FILL — TACROLIMUS 1 MG CAPSULE: 1 | 30 days supply | Qty: 240 | Fill #1

## 2018-04-03 MED FILL — CARVEDILOL 12.5 MG TABLET: 12.5 | 30 days supply | Qty: 60 | Fill #1

## 2018-04-03 MED FILL — MYFORTIC 180 MG TABLET: 180 | 30 days supply | Qty: 240 | Fill #0

## 2018-04-04 DIAGNOSIS — M9905 Segmental and somatic dysfunction of pelvic region: Secondary | ICD-10-CM | POA: Diagnosis not present

## 2018-04-04 DIAGNOSIS — M545 Low back pain: Secondary | ICD-10-CM | POA: Diagnosis not present

## 2018-04-04 DIAGNOSIS — M9903 Segmental and somatic dysfunction of lumbar region: Secondary | ICD-10-CM | POA: Diagnosis not present

## 2018-04-04 DIAGNOSIS — M9904 Segmental and somatic dysfunction of sacral region: Secondary | ICD-10-CM | POA: Diagnosis not present

## 2018-04-05 DIAGNOSIS — M545 Low back pain: Secondary | ICD-10-CM | POA: Diagnosis not present

## 2018-04-05 DIAGNOSIS — M9903 Segmental and somatic dysfunction of lumbar region: Secondary | ICD-10-CM | POA: Diagnosis not present

## 2018-04-05 DIAGNOSIS — M9904 Segmental and somatic dysfunction of sacral region: Secondary | ICD-10-CM | POA: Diagnosis not present

## 2018-04-05 DIAGNOSIS — M9905 Segmental and somatic dysfunction of pelvic region: Secondary | ICD-10-CM | POA: Diagnosis not present

## 2018-04-06 DIAGNOSIS — M9904 Segmental and somatic dysfunction of sacral region: Secondary | ICD-10-CM | POA: Diagnosis not present

## 2018-04-06 DIAGNOSIS — M545 Low back pain: Secondary | ICD-10-CM | POA: Diagnosis not present

## 2018-04-06 DIAGNOSIS — M9903 Segmental and somatic dysfunction of lumbar region: Secondary | ICD-10-CM | POA: Diagnosis not present

## 2018-04-06 DIAGNOSIS — M9905 Segmental and somatic dysfunction of pelvic region: Secondary | ICD-10-CM | POA: Diagnosis not present

## 2018-04-11 DIAGNOSIS — M545 Low back pain: Secondary | ICD-10-CM | POA: Diagnosis not present

## 2018-04-11 DIAGNOSIS — M9904 Segmental and somatic dysfunction of sacral region: Secondary | ICD-10-CM | POA: Diagnosis not present

## 2018-04-11 DIAGNOSIS — M9905 Segmental and somatic dysfunction of pelvic region: Secondary | ICD-10-CM | POA: Diagnosis not present

## 2018-04-11 DIAGNOSIS — M9903 Segmental and somatic dysfunction of lumbar region: Secondary | ICD-10-CM | POA: Diagnosis not present

## 2018-04-19 DIAGNOSIS — M9904 Segmental and somatic dysfunction of sacral region: Secondary | ICD-10-CM | POA: Diagnosis not present

## 2018-04-19 DIAGNOSIS — M9903 Segmental and somatic dysfunction of lumbar region: Secondary | ICD-10-CM | POA: Diagnosis not present

## 2018-04-19 DIAGNOSIS — M9905 Segmental and somatic dysfunction of pelvic region: Secondary | ICD-10-CM | POA: Diagnosis not present

## 2018-04-19 DIAGNOSIS — M545 Low back pain: Secondary | ICD-10-CM | POA: Diagnosis not present

## 2018-04-23 MED FILL — predniSONE 5 MG TABS: 5 | 30 days supply | Qty: 30 | Fill #1

## 2018-04-27 DIAGNOSIS — E119 Type 2 diabetes mellitus without complications: Secondary | ICD-10-CM | POA: Diagnosis not present

## 2018-04-27 DIAGNOSIS — Z940 Kidney transplant status: Secondary | ICD-10-CM | POA: Diagnosis not present

## 2018-04-27 DIAGNOSIS — Z48288 Encounter for aftercare following multiple organ transplant: Secondary | ICD-10-CM | POA: Diagnosis not present

## 2018-04-27 DIAGNOSIS — Z79899 Other long term (current) drug therapy: Secondary | ICD-10-CM | POA: Diagnosis not present

## 2018-04-27 DIAGNOSIS — D8989 Other specified disorders involving the immune mechanism, not elsewhere classified: Secondary | ICD-10-CM | POA: Diagnosis not present

## 2018-04-27 DIAGNOSIS — I10 Essential (primary) hypertension: Secondary | ICD-10-CM | POA: Diagnosis not present

## 2018-04-27 DIAGNOSIS — Z7952 Long term (current) use of systemic steroids: Secondary | ICD-10-CM | POA: Diagnosis not present

## 2018-04-27 DIAGNOSIS — Z792 Long term (current) use of antibiotics: Secondary | ICD-10-CM | POA: Diagnosis not present

## 2018-04-27 DIAGNOSIS — Z9483 Pancreas transplant status: Secondary | ICD-10-CM | POA: Diagnosis not present

## 2018-04-27 DIAGNOSIS — E109 Type 1 diabetes mellitus without complications: Secondary | ICD-10-CM | POA: Diagnosis not present

## 2018-04-28 DIAGNOSIS — Z79899 Other long term (current) drug therapy: Secondary | ICD-10-CM | POA: Diagnosis not present

## 2018-04-28 DIAGNOSIS — E119 Type 2 diabetes mellitus without complications: Secondary | ICD-10-CM | POA: Diagnosis not present

## 2018-04-28 DIAGNOSIS — Z940 Kidney transplant status: Secondary | ICD-10-CM | POA: Diagnosis not present

## 2018-04-28 DIAGNOSIS — Z9483 Pancreas transplant status: Secondary | ICD-10-CM | POA: Diagnosis not present

## 2018-04-28 DIAGNOSIS — I10 Essential (primary) hypertension: Secondary | ICD-10-CM | POA: Diagnosis not present

## 2018-04-28 DIAGNOSIS — Z48288 Encounter for aftercare following multiple organ transplant: Secondary | ICD-10-CM | POA: Diagnosis not present

## 2018-04-28 DIAGNOSIS — Z7952 Long term (current) use of systemic steroids: Secondary | ICD-10-CM | POA: Diagnosis not present

## 2018-04-30 MED FILL — SULFAMETHOXAZOLE-TMP SS TAB: 400-80 | 28 days supply | Qty: 12 | Fill #2

## 2018-04-30 MED FILL — CARVEDILOL 12.5 MG TABLET: 12.5 | 30 days supply | Qty: 60 | Fill #2

## 2018-05-08 MED FILL — MYCOPHENOLIC ACID DR 180 MG: 180 | 30 days supply | Qty: 240 | Fill #1

## 2018-05-14 MED FILL — AMLODIPINE BESYLATE 10 MG T: 10 | 90 days supply | Qty: 90 | Fill #1

## 2018-05-15 ENCOUNTER — Ambulatory Visit (INDEPENDENT_AMBULATORY_CARE_PROVIDER_SITE_OTHER): Payer: 59 | Admitting: Pharmacist

## 2018-05-15 DIAGNOSIS — Z79899 Other long term (current) drug therapy: Secondary | ICD-10-CM

## 2018-05-15 MED ORDER — MYCOPHENOLATE SODIUM 180 MG PO TBEC: 720.0000 mg | DELAYED_RELEASE_TABLET | Freq: Two times a day (BID) | ORAL | 3 refills | Status: DC

## 2018-05-15 NOTE — Progress Notes (Signed)
   S: Patient presents today to the [facility] for review of his specialty medication.   Patient is currently taking mycophenolate for kidney and pancreas transplant. 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 Patient is managed by [facility] for this. 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   Adherence: denies any missed doses  Efficacy: working well after most recent transplant. He denies any adverse effects and reports that he has tolerated the medication well since he started it 5 years ago.   Adverse effects: Infection: denies Cardiovascular effects (hyper/hypotension, edema, tachycardia): denies CNS effects (pain, headache, insomnia, dizziness): denies GI effects (N/V/D, pain): denies Hematologic effects: denies    O:     Lab Results  Component Value Date   WBC 8.2 11/05/2017   HGB 12.9 (L) 11/05/2017   HCT 40.8 11/05/2017   MCV 78.5 11/05/2017   PLT 264 11/05/2017      Chemistry      Component Value Date/Time   NA 133 (L) 11/05/2017 0755   K 3.6 11/05/2017 0755   CL 101 11/05/2017 0755   CO2 18 (L) 11/05/2017 0755   BUN 20 11/05/2017 0755   CREATININE 1.68 (H) 11/05/2017 0755      Component Value Date/Time   CALCIUM 8.9 11/05/2017 0755   ALKPHOS 58 11/05/2017 0755   AST 24 11/05/2017 0755   ALT 36 11/05/2017 0755   BILITOT 0.8 11/05/2017 0755       A/P: 1. Medication review: Patient is currently taking mycophenolate s/p kidney transplant and is tolerating it well with no adverse effects. Reviewed the medication with patient, including the following: mycophenolate is an immunosuppressant agent that has a cytostatic effect on T and B lymphocytes. Patient should have CBC regularly monitored as well as renal and liver function. Patient is at increased risk of infection and should alert  transplant clinic to s/sx of infection. Patient should not donate blood or semen while on this medication. Possible adverse effects include increased risk of infection, cardiac adverse effects, CNS adverse effects, GI adverse effects, and hematologic adverse effects. Adherence is critical to avoid organ rejection. No recommendations for any changes to treatment at this time.    Christella Hartigan, PharmD, BCPS, BCACP, CPP Clinical Pharmacist Practitioner  [phone]

## 2018-05-16 DIAGNOSIS — M9905 Segmental and somatic dysfunction of pelvic region: Secondary | ICD-10-CM | POA: Diagnosis not present

## 2018-05-16 DIAGNOSIS — M545 Low back pain: Secondary | ICD-10-CM | POA: Diagnosis not present

## 2018-05-16 DIAGNOSIS — M9904 Segmental and somatic dysfunction of sacral region: Secondary | ICD-10-CM | POA: Diagnosis not present

## 2018-05-16 DIAGNOSIS — M9903 Segmental and somatic dysfunction of lumbar region: Secondary | ICD-10-CM | POA: Diagnosis not present

## 2018-05-17 MED FILL — predniSONE 5 MG TABS: 5 | 30 days supply | Qty: 30 | Fill #2

## 2018-05-21 MED FILL — TACROLIMUS 1 MG CAPSULE: 1 | 30 days supply | Qty: 240 | Fill #2

## 2018-05-25 DIAGNOSIS — E109 Type 1 diabetes mellitus without complications: Secondary | ICD-10-CM | POA: Diagnosis not present

## 2018-05-25 DIAGNOSIS — G629 Polyneuropathy, unspecified: Secondary | ICD-10-CM | POA: Diagnosis not present

## 2018-05-25 DIAGNOSIS — B1910 Unspecified viral hepatitis B without hepatic coma: Secondary | ICD-10-CM | POA: Diagnosis not present

## 2018-05-25 DIAGNOSIS — E108 Type 1 diabetes mellitus with unspecified complications: Secondary | ICD-10-CM | POA: Diagnosis not present

## 2018-05-25 DIAGNOSIS — D649 Anemia, unspecified: Secondary | ICD-10-CM | POA: Diagnosis not present

## 2018-05-25 DIAGNOSIS — Z48288 Encounter for aftercare following multiple organ transplant: Secondary | ICD-10-CM | POA: Diagnosis not present

## 2018-05-25 DIAGNOSIS — Z792 Long term (current) use of antibiotics: Secondary | ICD-10-CM | POA: Diagnosis not present

## 2018-05-25 DIAGNOSIS — Z7952 Long term (current) use of systemic steroids: Secondary | ICD-10-CM | POA: Diagnosis not present

## 2018-05-25 DIAGNOSIS — K219 Gastro-esophageal reflux disease without esophagitis: Secondary | ICD-10-CM | POA: Diagnosis not present

## 2018-05-25 DIAGNOSIS — D899 Disorder involving the immune mechanism, unspecified: Secondary | ICD-10-CM | POA: Diagnosis not present

## 2018-05-25 DIAGNOSIS — Z940 Kidney transplant status: Secondary | ICD-10-CM | POA: Diagnosis not present

## 2018-05-25 DIAGNOSIS — I10 Essential (primary) hypertension: Secondary | ICD-10-CM | POA: Diagnosis not present

## 2018-05-25 DIAGNOSIS — Z9483 Pancreas transplant status: Secondary | ICD-10-CM | POA: Diagnosis not present

## 2018-05-25 DIAGNOSIS — D8989 Other specified disorders involving the immune mechanism, not elsewhere classified: Secondary | ICD-10-CM | POA: Diagnosis not present

## 2018-05-25 DIAGNOSIS — Z79899 Other long term (current) drug therapy: Secondary | ICD-10-CM | POA: Diagnosis not present

## 2018-06-04 MED FILL — SULFAMETHOXAZOLE-TMP SS TAB: 400-80 | 28 days supply | Qty: 12 | Fill #3

## 2018-06-04 MED FILL — CARVEDILOL 12.5 MG TABLET: 12.5 | 30 days supply | Qty: 60 | Fill #3

## 2018-06-04 MED FILL — MYCOPHENOLIC ACID DR 180 MG: 180 | 30 days supply | Qty: 240 | Fill #0

## 2018-06-11 DIAGNOSIS — Z940 Kidney transplant status: Secondary | ICD-10-CM | POA: Diagnosis not present

## 2018-06-11 DIAGNOSIS — I129 Hypertensive chronic kidney disease with stage 1 through stage 4 chronic kidney disease, or unspecified chronic kidney disease: Secondary | ICD-10-CM | POA: Diagnosis not present

## 2018-06-11 DIAGNOSIS — Z9483 Pancreas transplant status: Secondary | ICD-10-CM | POA: Diagnosis not present

## 2018-06-11 MED FILL — predniSONE 5 MG TABS: 5 | 30 days supply | Qty: 30 | Fill #3

## 2018-06-12 DIAGNOSIS — Z23 Encounter for immunization: Secondary | ICD-10-CM | POA: Diagnosis not present

## 2018-06-13 DIAGNOSIS — M9903 Segmental and somatic dysfunction of lumbar region: Secondary | ICD-10-CM | POA: Diagnosis not present

## 2018-06-13 DIAGNOSIS — M9905 Segmental and somatic dysfunction of pelvic region: Secondary | ICD-10-CM | POA: Diagnosis not present

## 2018-06-13 DIAGNOSIS — M9904 Segmental and somatic dysfunction of sacral region: Secondary | ICD-10-CM | POA: Diagnosis not present

## 2018-06-13 DIAGNOSIS — M5136 Other intervertebral disc degeneration, lumbar region: Secondary | ICD-10-CM | POA: Diagnosis not present

## 2018-06-20 MED FILL — TACROLIMUS 1 MG CAPSULE: 1 | 30 days supply | Qty: 240 | Fill #3

## 2018-06-26 MED FILL — SULFAMETHOXAZOLE-TMP SS TAB: 400-80 | 28 days supply | Qty: 12 | Fill #4

## 2018-06-28 MED FILL — MYCOPHENOLIC ACID DR 180 MG: 180 | 30 days supply | Qty: 240 | Fill #1

## 2018-07-02 MED FILL — CARVEDILOL 12.5 MG TABLET: 12.5 | 90 days supply | Qty: 180 | Fill #4

## 2018-07-03 DIAGNOSIS — M9903 Segmental and somatic dysfunction of lumbar region: Secondary | ICD-10-CM | POA: Diagnosis not present

## 2018-07-03 DIAGNOSIS — M9905 Segmental and somatic dysfunction of pelvic region: Secondary | ICD-10-CM | POA: Diagnosis not present

## 2018-07-03 DIAGNOSIS — M5136 Other intervertebral disc degeneration, lumbar region: Secondary | ICD-10-CM | POA: Diagnosis not present

## 2018-07-03 DIAGNOSIS — M9904 Segmental and somatic dysfunction of sacral region: Secondary | ICD-10-CM | POA: Diagnosis not present

## 2018-07-16 DIAGNOSIS — Z940 Kidney transplant status: Secondary | ICD-10-CM | POA: Diagnosis not present

## 2018-07-16 MED FILL — predniSONE 5 MG TABS: 5 | 30 days supply | Qty: 30 | Fill #4

## 2018-07-16 MED FILL — TACROLIMUS 1 MG CAPSULE: 1 | 30 days supply | Qty: 240 | Fill #4

## 2018-07-18 MED FILL — SULFAMETHOXAZOLE-TMP SS TAB: 400-80 | 28 days supply | Qty: 12 | Fill #5

## 2018-07-31 MED FILL — MYCOPHENOLIC ACID DR 180 MG: 180 | 30 days supply | Qty: 240 | Fill #2

## 2018-08-06 ENCOUNTER — Encounter: Payer: Self-pay | Admitting: Podiatry

## 2018-08-06 ENCOUNTER — Ambulatory Visit (INDEPENDENT_AMBULATORY_CARE_PROVIDER_SITE_OTHER): Payer: Medicare Other | Admitting: Podiatry

## 2018-08-06 DIAGNOSIS — L600 Ingrowing nail: Secondary | ICD-10-CM

## 2018-08-06 MED ORDER — NEOMYCIN-POLYMYXIN-HC 3.5-10000-1 OT SOLN: OTIC | 0 refills | Status: DC

## 2018-08-06 MED FILL — NEO/POLYMYXIN/HC EAR SOLN: 3.5-10000-1 | 20 days supply | Qty: 10 | Fill #0

## 2018-08-06 NOTE — Patient Instructions (Signed)

## 2018-08-06 NOTE — Progress Notes (Signed)
Subjective:   Patient ID: Gregory Allen, male   DOB: 47 y.o.   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  I infiltrated the right hallux 60 mg like Marcaine mixture remove the medial border exposed matrix and applied phenol 3 applications 30 seconds followed by alcohol lavage and sterile dressing.  Given instructions on soaks and reappoint

## 2018-08-13 MED FILL — predniSONE 5 MG TABS: 5 | 30 days supply | Qty: 30 | Fill #5

## 2018-08-15 ENCOUNTER — Ambulatory Visit: Payer: Medicare Other | Admitting: Podiatry

## 2018-08-20 DIAGNOSIS — M9904 Segmental and somatic dysfunction of sacral region: Secondary | ICD-10-CM | POA: Diagnosis not present

## 2018-08-20 DIAGNOSIS — M9903 Segmental and somatic dysfunction of lumbar region: Secondary | ICD-10-CM | POA: Diagnosis not present

## 2018-08-20 DIAGNOSIS — M9905 Segmental and somatic dysfunction of pelvic region: Secondary | ICD-10-CM | POA: Diagnosis not present

## 2018-08-20 DIAGNOSIS — M5136 Other intervertebral disc degeneration, lumbar region: Secondary | ICD-10-CM | POA: Diagnosis not present

## 2018-08-20 MED FILL — AMLODIPINE BESYLATE 10 MG T: 10 | 90 days supply | Qty: 90 | Fill #0

## 2018-08-22 DIAGNOSIS — Z940 Kidney transplant status: Secondary | ICD-10-CM | POA: Diagnosis not present

## 2018-08-27 DIAGNOSIS — Z940 Kidney transplant status: Secondary | ICD-10-CM | POA: Diagnosis not present

## 2018-08-27 DIAGNOSIS — Z23 Encounter for immunization: Secondary | ICD-10-CM | POA: Diagnosis not present

## 2018-08-27 DIAGNOSIS — Z9483 Pancreas transplant status: Secondary | ICD-10-CM | POA: Diagnosis not present

## 2018-08-27 DIAGNOSIS — I129 Hypertensive chronic kidney disease with stage 1 through stage 4 chronic kidney disease, or unspecified chronic kidney disease: Secondary | ICD-10-CM | POA: Diagnosis not present

## 2018-08-27 MED FILL — TACROLIMUS 1 MG CAPSULE: 1 | 30 days supply | Qty: 240 | Fill #5

## 2018-08-27 MED FILL — MYCOPHENOLIC ACID DR 180 MG: 180 | 30 days supply | Qty: 240 | Fill #3

## 2018-08-31 MED FILL — SULFAMETHOXAZOLE-TMP SS TAB: 400-80 | 28 days supply | Qty: 12 | Fill #0

## 2018-09-06 DIAGNOSIS — M9903 Segmental and somatic dysfunction of lumbar region: Secondary | ICD-10-CM | POA: Diagnosis not present

## 2018-09-06 DIAGNOSIS — M9905 Segmental and somatic dysfunction of pelvic region: Secondary | ICD-10-CM | POA: Diagnosis not present

## 2018-09-06 DIAGNOSIS — M5136 Other intervertebral disc degeneration, lumbar region: Secondary | ICD-10-CM | POA: Diagnosis not present

## 2018-09-06 DIAGNOSIS — M9904 Segmental and somatic dysfunction of sacral region: Secondary | ICD-10-CM | POA: Diagnosis not present

## 2018-09-06 MED FILL — predniSONE 5 MG TABS: 5 | 30 days supply | Qty: 30 | Fill #0

## 2018-09-24 MED FILL — MYCOPHENOLIC ACID DR 180 MG: 180 | 30 days supply | Qty: 240 | Fill #0

## 2018-09-24 MED FILL — SULFAMETHOXAZOLE-TMP SS TAB: 400-80 | 28 days supply | Qty: 12 | Fill #1

## 2018-09-24 MED FILL — CARVEDILOL 12.5 MG TABLET: 12.5 | 90 days supply | Qty: 180 | Fill #5

## 2018-09-28 MED FILL — TACROLIMUS 1 MG CAPSULE: 1 | 30 days supply | Qty: 240 | Fill #0

## 2018-10-10 ENCOUNTER — Emergency Department (HOSPITAL_COMMUNITY)
Admission: EM | Admit: 2018-10-10 | Discharge: 2018-10-11 | Disposition: A | Discharge status: Home / Self Care | Payer: 59 | Attending: Emergency Medicine | Admitting: Emergency Medicine

## 2018-10-10 ENCOUNTER — Encounter (HOSPITAL_COMMUNITY): Payer: Self-pay

## 2018-10-10 ENCOUNTER — Emergency Department (HOSPITAL_COMMUNITY): Payer: 59

## 2018-10-10 DIAGNOSIS — Y929 Unspecified place or not applicable: Secondary | ICD-10-CM | POA: Diagnosis not present

## 2018-10-10 DIAGNOSIS — Y939 Activity, unspecified: Secondary | ICD-10-CM | POA: Diagnosis not present

## 2018-10-10 DIAGNOSIS — R102 Pelvic and perineal pain: Secondary | ICD-10-CM | POA: Diagnosis not present

## 2018-10-10 DIAGNOSIS — Y999 Unspecified external cause status: Secondary | ICD-10-CM | POA: Insufficient documentation

## 2018-10-10 DIAGNOSIS — S7001XA Contusion of right hip, initial encounter: Secondary | ICD-10-CM | POA: Diagnosis not present

## 2018-10-10 DIAGNOSIS — S59902A Unspecified injury of left elbow, initial encounter: Secondary | ICD-10-CM | POA: Diagnosis not present

## 2018-10-10 DIAGNOSIS — E119 Type 2 diabetes mellitus without complications: Secondary | ICD-10-CM | POA: Insufficient documentation

## 2018-10-10 DIAGNOSIS — W172XXA Fall into hole, initial encounter: Secondary | ICD-10-CM | POA: Insufficient documentation

## 2018-10-10 DIAGNOSIS — Z7982 Long term (current) use of aspirin: Secondary | ICD-10-CM | POA: Insufficient documentation

## 2018-10-10 DIAGNOSIS — S3993XA Unspecified injury of pelvis, initial encounter: Secondary | ICD-10-CM | POA: Diagnosis not present

## 2018-10-10 DIAGNOSIS — Z9483 Pancreas transplant status: Secondary | ICD-10-CM | POA: Insufficient documentation

## 2018-10-10 DIAGNOSIS — Z79899 Other long term (current) drug therapy: Secondary | ICD-10-CM | POA: Diagnosis not present

## 2018-10-10 DIAGNOSIS — Z940 Kidney transplant status: Secondary | ICD-10-CM | POA: Diagnosis not present

## 2018-10-10 DIAGNOSIS — M25522 Pain in left elbow: Secondary | ICD-10-CM | POA: Diagnosis not present

## 2018-10-10 DIAGNOSIS — M533 Sacrococcygeal disorders, not elsewhere classified: Secondary | ICD-10-CM | POA: Diagnosis not present

## 2018-10-10 DIAGNOSIS — S79911A Unspecified injury of right hip, initial encounter: Secondary | ICD-10-CM | POA: Diagnosis not present

## 2018-10-10 DIAGNOSIS — M25551 Pain in right hip: Secondary | ICD-10-CM | POA: Diagnosis not present

## 2018-10-10 DIAGNOSIS — W19XXXA Unspecified fall, initial encounter: Secondary | ICD-10-CM

## 2018-10-10 NOTE — ED Notes (Addendum)
ED Provider at bedside. See EDP assessment.

## 2018-10-10 NOTE — ED Triage Notes (Signed)
Pt states that he was walking backwards and fell into a ditch, c/o of r hip, tailbone and and r elbow pain, did not hit head no LOC

## 2018-10-11 ENCOUNTER — Emergency Department (HOSPITAL_COMMUNITY): Payer: 59

## 2018-10-11 DIAGNOSIS — M533 Sacrococcygeal disorders, not elsewhere classified: Secondary | ICD-10-CM | POA: Diagnosis not present

## 2018-10-11 DIAGNOSIS — S3993XA Unspecified injury of pelvis, initial encounter: Secondary | ICD-10-CM | POA: Diagnosis not present

## 2018-10-11 DIAGNOSIS — S7001XA Contusion of right hip, initial encounter: Secondary | ICD-10-CM | POA: Diagnosis not present

## 2018-10-11 DIAGNOSIS — Z7982 Long term (current) use of aspirin: Secondary | ICD-10-CM | POA: Diagnosis not present

## 2018-10-11 DIAGNOSIS — R102 Pelvic and perineal pain: Secondary | ICD-10-CM | POA: Diagnosis not present

## 2018-10-11 DIAGNOSIS — Z9483 Pancreas transplant status: Secondary | ICD-10-CM | POA: Diagnosis not present

## 2018-10-11 DIAGNOSIS — Z940 Kidney transplant status: Secondary | ICD-10-CM | POA: Diagnosis not present

## 2018-10-11 DIAGNOSIS — Z79899 Other long term (current) drug therapy: Secondary | ICD-10-CM | POA: Diagnosis not present

## 2018-10-11 DIAGNOSIS — E119 Type 2 diabetes mellitus without complications: Secondary | ICD-10-CM | POA: Diagnosis not present

## 2018-10-11 MED ORDER — OXYCODONE-ACETAMINOPHEN 5-325 MG PO TABS: 1.0000 | ORAL_TABLET | Freq: Four times a day (QID) | ORAL | 0 refills | Status: DC | PRN

## 2018-10-11 MED ORDER — OXYCODONE-ACETAMINOPHEN 5-325 MG PO TABS
1.0000 | ORAL_TABLET | Freq: Once | ORAL | Status: AC
  Administered 2018-10-11: 1 via ORAL
  Filled 2018-10-11: qty 1
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
## 2018-10-11 NOTE — ED Notes (Signed)
Pt verbalizes understanding of d/c instructions. Prescriptions reviewed with patient. Pt taken to lobby in wheelchair at d/c with all belongings and with family.   

## 2018-10-11 NOTE — Discharge Instructions (Addendum)
Please follow-up with your primary care doctor for evaluation of PSA.  This is a prostate screening number.  As we discussed there are areas on your hips where the bone is thicker than expected.  This may be a side effect of your medications, however you need to follow up with your primary care doctor to make sure it is not more serious.  You can use the crutches as needed.    Please take Tylenol (acetaminophen) to relieve your pain.  You may take tylenol, up to 1,000 mg (two extra strength pills).  Do not take more than 3,000 mg tylenol in a 24 hour period.  Please check all medication labels as many medications such as pain and cold medications (including your percocet!) may contain tylenol. Please do not drink alcohol while taking this medication.   Today you received medications that may make you sleepy or impair your ability to make decisions.  For the next 24 hours please do not drive, operate heavy machinery, care for a small child with out another adult present, or perform any activities that may cause harm to you or someone else if you were to fall asleep or be impaired.   You are being prescribed a medication which may make you sleepy. Please follow up of listed precautions for at least 24 hours after taking one dose.

## 2018-10-11 NOTE — ED Notes (Signed)
Patient transported to CT 

## 2018-10-12 DIAGNOSIS — Z6831 Body mass index (BMI) 31.0-31.9, adult: Secondary | ICD-10-CM | POA: Diagnosis not present

## 2018-10-12 DIAGNOSIS — M545 Low back pain: Secondary | ICD-10-CM | POA: Diagnosis not present

## 2018-10-12 DIAGNOSIS — W1849XA Other slipping, tripping and stumbling without falling, initial encounter: Secondary | ICD-10-CM | POA: Diagnosis not present

## 2018-10-15 MED FILL — predniSONE 5 MG TABS: 5 | 30 days supply | Qty: 30 | Fill #1

## 2018-10-22 MED FILL — TACROLIMUS 1 MG CAPSULE: 1 | 30 days supply | Qty: 240 | Fill #1

## 2018-10-22 MED FILL — SULFAMETHOXAZOLE-TMP SS TAB: 400-80 | 28 days supply | Qty: 12 | Fill #2

## 2018-10-29 ENCOUNTER — Other Ambulatory Visit: Payer: Self-pay | Admitting: Pharmacist

## 2018-10-29 ENCOUNTER — Other Ambulatory Visit: Payer: Self-pay | Admitting: Internal Medicine

## 2018-10-29 MED ORDER — MYCOPHENOLATE SODIUM 180 MG PO TBEC: 720.0000 mg | DELAYED_RELEASE_TABLET | Freq: Two times a day (BID) | ORAL | 3 refills | Status: DC

## 2018-10-29 MED FILL — MYCOPHENOLIC ACID DR 180 MG: 180 | 30 days supply | Qty: 240 | Fill #0

## 2018-11-08 DIAGNOSIS — M545 Low back pain: Secondary | ICD-10-CM | POA: Diagnosis not present

## 2018-11-08 DIAGNOSIS — M9903 Segmental and somatic dysfunction of lumbar region: Secondary | ICD-10-CM | POA: Diagnosis not present

## 2018-11-08 DIAGNOSIS — M5136 Other intervertebral disc degeneration, lumbar region: Secondary | ICD-10-CM | POA: Diagnosis not present

## 2018-11-08 DIAGNOSIS — M9904 Segmental and somatic dysfunction of sacral region: Secondary | ICD-10-CM | POA: Diagnosis not present

## 2018-11-12 MED FILL — predniSONE 5 MG TABS: 5 | 30 days supply | Qty: 30 | Fill #2

## 2018-11-13 DIAGNOSIS — Z6831 Body mass index (BMI) 31.0-31.9, adult: Secondary | ICD-10-CM | POA: Diagnosis not present

## 2018-11-13 DIAGNOSIS — J019 Acute sinusitis, unspecified: Secondary | ICD-10-CM | POA: Diagnosis not present

## 2018-11-13 DIAGNOSIS — R05 Cough: Secondary | ICD-10-CM | POA: Diagnosis not present

## 2018-11-13 MED FILL — SULFAMETHOXAZOLE-TMP SS TAB: 400-80 | 28 days supply | Qty: 12 | Fill #3

## 2018-11-13 MED FILL — TACROLIMUS 1 MG CAPSULE: 1 | 30 days supply | Qty: 240 | Fill #2

## 2018-11-13 MED FILL — CEFDINIR 300 MG CAPSULE: 300 | 7 days supply | Qty: 14 | Fill #0

## 2018-11-13 MED FILL — FLUTICASONE PROP 50 MCG SPR: 50 | 30 days supply | Qty: 16 | Fill #0

## 2018-11-19 MED FILL — AMOXICILLIN 500 MG CAPSULE: 500 | 3 days supply | Qty: 12 | Fill #0

## 2018-11-19 MED FILL — AMLODIPINE BESYLATE 10 MG T: 10 | 90 days supply | Qty: 90 | Fill #1

## 2018-11-22 MED FILL — MYCOPHENOLIC ACID DR 180 MG: 180 | 30 days supply | Qty: 240 | Fill #1

## 2018-11-27 DIAGNOSIS — M9903 Segmental and somatic dysfunction of lumbar region: Secondary | ICD-10-CM | POA: Diagnosis not present

## 2018-11-27 DIAGNOSIS — M545 Low back pain: Secondary | ICD-10-CM | POA: Diagnosis not present

## 2018-11-27 DIAGNOSIS — M5136 Other intervertebral disc degeneration, lumbar region: Secondary | ICD-10-CM | POA: Diagnosis not present

## 2018-11-27 DIAGNOSIS — M9904 Segmental and somatic dysfunction of sacral region: Secondary | ICD-10-CM | POA: Diagnosis not present

## 2018-11-30 DIAGNOSIS — I10 Essential (primary) hypertension: Secondary | ICD-10-CM | POA: Diagnosis not present

## 2018-11-30 DIAGNOSIS — Z79899 Other long term (current) drug therapy: Secondary | ICD-10-CM | POA: Diagnosis not present

## 2018-11-30 DIAGNOSIS — E1340 Other specified diabetes mellitus with diabetic neuropathy, unspecified: Secondary | ICD-10-CM | POA: Diagnosis not present

## 2018-11-30 DIAGNOSIS — Z7952 Long term (current) use of systemic steroids: Secondary | ICD-10-CM | POA: Diagnosis not present

## 2018-11-30 DIAGNOSIS — E10649 Type 1 diabetes mellitus with hypoglycemia without coma: Secondary | ICD-10-CM | POA: Diagnosis not present

## 2018-11-30 DIAGNOSIS — Z940 Kidney transplant status: Secondary | ICD-10-CM | POA: Diagnosis not present

## 2018-11-30 DIAGNOSIS — Z9483 Pancreas transplant status: Secondary | ICD-10-CM | POA: Diagnosis not present

## 2018-11-30 DIAGNOSIS — Z792 Long term (current) use of antibiotics: Secondary | ICD-10-CM | POA: Diagnosis not present

## 2018-11-30 DIAGNOSIS — K219 Gastro-esophageal reflux disease without esophagitis: Secondary | ICD-10-CM | POA: Diagnosis not present

## 2018-11-30 DIAGNOSIS — D649 Anemia, unspecified: Secondary | ICD-10-CM | POA: Diagnosis not present

## 2018-11-30 DIAGNOSIS — B1910 Unspecified viral hepatitis B without hepatic coma: Secondary | ICD-10-CM | POA: Diagnosis not present

## 2018-11-30 DIAGNOSIS — Z48288 Encounter for aftercare following multiple organ transplant: Secondary | ICD-10-CM | POA: Diagnosis not present

## 2018-12-07 DIAGNOSIS — L700 Acne vulgaris: Secondary | ICD-10-CM | POA: Diagnosis not present

## 2018-12-07 DIAGNOSIS — D2272 Melanocytic nevi of left lower limb, including hip: Secondary | ICD-10-CM | POA: Diagnosis not present

## 2018-12-07 DIAGNOSIS — D225 Melanocytic nevi of trunk: Secondary | ICD-10-CM | POA: Diagnosis not present

## 2018-12-07 DIAGNOSIS — D220 Melanocytic nevi of lip: Secondary | ICD-10-CM | POA: Diagnosis not present

## 2018-12-07 DIAGNOSIS — D2261 Melanocytic nevi of right upper limb, including shoulder: Secondary | ICD-10-CM | POA: Diagnosis not present

## 2018-12-07 DIAGNOSIS — D2262 Melanocytic nevi of left upper limb, including shoulder: Secondary | ICD-10-CM | POA: Diagnosis not present

## 2018-12-07 MED FILL — CLINDAMYCIN PHOSP 1% LOTION: 1 | 30 days supply | Qty: 60 | Fill #0

## 2018-12-10 MED FILL — SULFAMETHOXAZOLE-TMP SS TAB: 400-80 | 28 days supply | Qty: 12 | Fill #4

## 2018-12-10 MED FILL — predniSONE 5 MG TABS: 5 | 30 days supply | Qty: 30 | Fill #3

## 2018-12-17 MED FILL — MYCOPHENOLIC ACID DR 180 MG: 180 | 30 days supply | Qty: 240 | Fill #2

## 2018-12-17 MED FILL — TACROLIMUS 1 MG CAPSULE: 1 | 30 days supply | Qty: 240 | Fill #3

## 2018-12-18 DIAGNOSIS — M549 Dorsalgia, unspecified: Secondary | ICD-10-CM | POA: Diagnosis not present

## 2018-12-18 DIAGNOSIS — Z6831 Body mass index (BMI) 31.0-31.9, adult: Secondary | ICD-10-CM | POA: Diagnosis not present

## 2018-12-18 DIAGNOSIS — M25552 Pain in left hip: Secondary | ICD-10-CM | POA: Diagnosis not present

## 2018-12-20 MED FILL — TRETINOIN 0.05 % CREA: 0.05 | 30 days supply | Qty: 45 | Fill #0

## 2018-12-21 ENCOUNTER — Ambulatory Visit (INDEPENDENT_AMBULATORY_CARE_PROVIDER_SITE_OTHER): Payer: 59

## 2018-12-21 ENCOUNTER — Ambulatory Visit (INDEPENDENT_AMBULATORY_CARE_PROVIDER_SITE_OTHER): Payer: 59 | Admitting: Orthopedic Surgery

## 2018-12-21 ENCOUNTER — Encounter (INDEPENDENT_AMBULATORY_CARE_PROVIDER_SITE_OTHER): Payer: Self-pay | Admitting: Orthopedic Surgery

## 2018-12-21 DIAGNOSIS — M25552 Pain in left hip: Secondary | ICD-10-CM

## 2018-12-21 DIAGNOSIS — M545 Low back pain, unspecified: Secondary | ICD-10-CM

## 2018-12-21 DIAGNOSIS — G8929 Other chronic pain: Secondary | ICD-10-CM

## 2018-12-21 NOTE — Progress Notes (Signed)
Office Visit Note   Patient: Gregory Allen           Date of Birth: [birth year]           MRN: [id number] Visit Date: 12/21/2018 Requested by: Velna Hatchet, MD [address] [place], [place] [address] PCP: Velna Hatchet, MD  Subjective: Chief Complaint  Patient presents with  . Left Hip - Pain  . Lower Back - Pain    HPI: Gregory Allen is a patient with 2-week history of mid thoracic back pain as well as left hip and buttock pain.  Patient does have a history of prior surgery L5-S1 discectomy 10 years ago.  He also has had kidney pancreas transplant.  Denies any numbness in the legs and any radicular symptoms into the lower extremities.  He has been using heating pad ice and taking Tylenol.  He cannot take ibuprofen.  Reports pain with sitting in the thoracic spine and pain with walking in that left hip and buttock region.  Denies any fevers or chills.              ROS: All systems reviewed are negative as they relate to the chief complaint within the history of present illness.  Patient denies  fevers or chills.   Assessment & Plan: Visit Diagnoses:  1. Pain in left hip   2. Chronic low back pain, unspecified back pain laterality, unspecified whether sciatica present     Plan: Impression is low back and left buttock pain which looks to be primarily related to his prior surgery.  Could be some facet arthritis mediated.  Hip exam and radiographs on the left are normal.  This thoracic spine pain is more off midline and could be more muscular and trigger point type pain.  I think that something we can watch with only 2 weeks of symptoms.  However due to the nature of his left hip pain and his inability to take anti-inflammatories as well as his attempts to improve this on his own with stretching and nonoperative measures I think MRI scan with ESI to follow would be his optimal course.  Follow-Up Instructions: Return for after MRI.   Orders:  Orders Placed This Encounter    Procedures  . XR HIP UNILAT W OR W/O PELVIS 2-3 VIEWS LEFT  . XR Lumbar Spine 2-3 Views  . XR Thoracic Spine 2 View  . MR Lumbar Spine w/o contrast   No orders of the defined types were placed in this encounter.     Procedures: No procedures performed   Clinical Data: No additional findings.  Objective: Vital Signs: There were no vitals taken for this visit.  Physical Exam:   Constitutional: Patient appears well-developed HEENT:  Head: Normocephalic Eyes:EOM are normal Neck: Normal range of motion Cardiovascular: Normal rate Pulmonary/chest: Effort normal Neurologic: Patient is alert Skin: Skin is warm Psychiatric: Patient has normal mood and affect    Ortho Exam: Ortho exam demonstrates full active and passive range of motion of the hip on the left and right-hand side.  Good abduction adduction and hip flexion strength.  Does have well-healed surgical incision in the back from prior discectomy.  Focal pain off midline between the shoulder blades around T8.  No masses or skin changes noted in this region.  No muscle atrophy in the legs and strength is good to dorsiflexion plantarflexion quad hamstring strength testing.  Specialty Comments:  No specialty comments available.  Imaging: Xr Hip Unilat W Or W/o Pelvis 2-3 Views Left  Result Date: 12/21/2018 AP pelvis lateral left hip reviewed.  Metallic clips in place in the right lower quadrant.  Degenerative changes noted about the lower lumbar spine.  Hip joints appear well preserved with no significant arthritis present.  Mild enthesopathic changes noted at the initial tuberosity on the left-hand side  Xr Thoracic Spine 2 View  Result Date: 12/21/2018 AP lateral thoracic spine reviewed.  Very mild postural scoliosis noted.  Visualized lung fields clear although pulmonary markings are slightly increased.  Thoracic spine has mild degenerative changes but no acute compression fractures or vertebral body  abnormalities.  Xr Lumbar Spine 2-3 Views  Result Date: 12/21/2018 AP lateral lumbar spine reviewed.  Facet arthritis is present at L3-4 L4-5 and L5-S1.  Not much in the way of degenerative disc disease or narrowing at these levels.    PMFS History: Patient Active Problem List   Diagnosis Date Noted  . BP (high blood pressure) 11/19/2015  . History of organ or tissue transplant 05/12/2015  . Essential (primary) hypertension 05/12/2015  . Combined fat and carbohydrate induced hyperlipemia 05/12/2015  . Type 1 diabetes mellitus with hyperglycemia ([place]) 05/12/2015  . Arthritis of hand, degenerative 09/08/2014  . Hypophosphatemia 04/24/2012  . Carpal bone fracture 01/17/2012  . Tarsal tunnel syndrome 01/17/2012  . Nonproliferative diabetic retinopathy ([place]) 02/08/2011  . Type 1 diabetes mellitus ([place]) 02/08/2011  . Diabetic polyneuropathy ([place]) 02/08/2011  . Abnormal presence of protein in urine 02/08/2011   Past Medical History:  Diagnosis Date  . Diabetes mellitus without complication ([place])     No family history on file.  Past Surgical History:  Procedure Laterality Date  . KIDNEY TRANSPLANT    . TRANSPLANT PANCREATIC ALLOGRAFT     Social History   Occupational History  . Not on file  Tobacco Use  . Smoking status: Never Smoker  . Smokeless tobacco: Never Used  Substance and Sexual Activity  . Alcohol use: No    Alcohol/week: 0.0 standard drinks  . Drug use: No  . Sexual activity: Yes

## 2018-12-24 MED FILL — CARVEDILOL 12.5 MG TABLET: 12.5 | 30 days supply | Qty: 60 | Fill #6

## 2019-01-01 ENCOUNTER — Telehealth (INDEPENDENT_AMBULATORY_CARE_PROVIDER_SITE_OTHER): Payer: Self-pay | Admitting: Orthopedic Surgery

## 2019-01-01 NOTE — Telephone Encounter (Signed)
No groin pain.  Examination of the hip normal and radiographs normal.  Makes more sense to start with the back.  If the back does not show anything then we can go back to the hip.  Please call thanks.!!

## 2019-01-01 NOTE — Telephone Encounter (Signed)
Please review and advise. Thanks.  

## 2019-01-01 NOTE — Telephone Encounter (Signed)
Patient called left voicemail message that he is going to have an MRI on his back. Patient said his hip is hurting as well and want to know why Dr Marlou Sa did not add an MRI on his hip to the order? The number to contact patient is [phone]

## 2019-01-01 NOTE — Telephone Encounter (Signed)
IC s/w patient and advised. He verbalized understanding.

## 2019-01-03 ENCOUNTER — Ambulatory Visit
Admission: RE | Admit: 2019-01-03 | Discharge: 2019-01-03 | Disposition: A | Discharge status: Home / Self Care | Payer: 59 | Source: Ambulatory Visit | Attending: Orthopedic Surgery | Admitting: Orthopedic Surgery

## 2019-01-03 DIAGNOSIS — M545 Low back pain: Secondary | ICD-10-CM

## 2019-01-03 DIAGNOSIS — M25552 Pain in left hip: Secondary | ICD-10-CM

## 2019-01-03 DIAGNOSIS — M5127 Other intervertebral disc displacement, lumbosacral region: Secondary | ICD-10-CM | POA: Diagnosis not present

## 2019-01-03 DIAGNOSIS — G8929 Other chronic pain: Secondary | ICD-10-CM

## 2019-01-09 ENCOUNTER — Ambulatory Visit (INDEPENDENT_AMBULATORY_CARE_PROVIDER_SITE_OTHER): Payer: 59 | Admitting: Orthopedic Surgery

## 2019-01-09 ENCOUNTER — Encounter (INDEPENDENT_AMBULATORY_CARE_PROVIDER_SITE_OTHER): Payer: Self-pay | Admitting: Orthopedic Surgery

## 2019-01-09 DIAGNOSIS — G8929 Other chronic pain: Secondary | ICD-10-CM | POA: Diagnosis not present

## 2019-01-09 DIAGNOSIS — M545 Low back pain, unspecified: Secondary | ICD-10-CM

## 2019-01-09 DIAGNOSIS — M48061 Spinal stenosis, lumbar region without neurogenic claudication: Secondary | ICD-10-CM

## 2019-01-09 NOTE — Progress Notes (Signed)
Office Visit Note   Patient: Gregory Allen           Date of Birth: 07/06/1971           MRN: [id number] Visit Date: 01/09/2019 Requested by: Velna Hatchet, MD [address] [place], [place] [address] PCP: Velna Hatchet, MD  Subjective: Chief Complaint  Patient presents with  . Lower Back - Follow-up    HPI: Gregory Allen is a patient with left posterior buttock pain.  Since have seen him he had an MRI scan which does show some L4 foraminal stenosis.  Has a history of prior surgery on the right-hand side.  Has buttock pain only with no significant radiation.  Taking Tylenol.  Does have a history of transplant but he can receive epidural steroid injections.              ROS: All systems reviewed are negative as they relate to the chief complaint within the history of present illness.  Patient denies  fevers or chills.   Assessment & Plan: Visit Diagnoses:  1. Chronic low back pain, unspecified back pain laterality, unspecified whether sciatica present     Plan: Impression is left hip and buttock pain with low back pain and foraminal stenosis.  Nothing definitively operative in the back.  I think that left-sided buttock pain with no groin pain likely represents referred pain from the back.  I think he would do well to get an injection with Dr. Ernestina Patches.  Potentially 2 injections.  I will see him back as needed.  Follow-Up Instructions: No follow-ups on file.   Orders:  Orders Placed This Encounter  Procedures  . Ambulatory referral to Physical Medicine Rehab   No orders of the defined types were placed in this encounter.     Procedures: No procedures performed   Clinical Data: No additional findings.  Objective: Vital Signs: There were no vitals taken for this visit.  Physical Exam:   Constitutional: Patient appears well-developed HEENT:  Head: Normocephalic Eyes:EOM are normal Neck: Normal range of motion Cardiovascular: Normal rate Pulmonary/chest: Effort  normal Neurologic: Patient is alert Skin: Skin is warm Psychiatric: Patient has normal mood and affect    Ortho Exam: Ortho exam demonstrates full active and passive range of motion of the knees hips and ankle.  He has good hip flexion abduction adduction strength.  No groin pain with internal X rotation of the leg.  No other masses lymphadenopathy or skin changes noted in that back region.  Does have a little bit of pain with forward lateral bending but no trochanteric tenderness.  Patient also has left greater than right nerve root tension signs.  Specialty Comments:  No specialty comments available.  Imaging: No results found.   PMFS History: Patient Active Problem List   Diagnosis Date Noted  . BP (high blood pressure) 11/19/2015  . History of organ or tissue transplant 05/12/2015  . Essential (primary) hypertension 05/12/2015  . Combined fat and carbohydrate induced hyperlipemia 05/12/2015  . Type 1 diabetes mellitus with hyperglycemia ([place]) 05/12/2015  . Arthritis of hand, degenerative 09/08/2014  . Hypophosphatemia 04/24/2012  . Carpal bone fracture 01/17/2012  . Tarsal tunnel syndrome 01/17/2012  . Nonproliferative diabetic retinopathy ([place]) 02/08/2011  . Type 1 diabetes mellitus ([place]) 02/08/2011  . Diabetic polyneuropathy ([place]) 02/08/2011  . Abnormal presence of protein in urine 02/08/2011   Past Medical History:  Diagnosis Date  . Diabetes mellitus without complication ([place])     History reviewed. No pertinent family history.  Past Surgical History:  Procedure Laterality Date  . KIDNEY TRANSPLANT    . TRANSPLANT PANCREATIC ALLOGRAFT     Social History   Occupational History  . Not on file  Tobacco Use  . Smoking status: Never Smoker  . Smokeless tobacco: Never Used  Substance and Sexual Activity  . Alcohol use: No    Alcohol/week: 0.0 standard drinks  . Drug use: No  . Sexual activity: Yes

## 2019-01-11 MED FILL — predniSONE 5 MG TABS: 5 | 30 days supply | Qty: 30 | Fill #0

## 2019-01-11 MED FILL — SULFAMETHOXAZOLE-TMP SS TAB: 400-80 | 28 days supply | Qty: 12 | Fill #5

## 2019-01-23 MED FILL — AMLODIPINE BESYLATE 10 MG T: 10 | 90 days supply | Qty: 90 | Fill #0

## 2019-01-23 MED FILL — predniSONE 5 MG TABS: 5 | 90 days supply | Qty: 90 | Fill #1

## 2019-01-23 MED FILL — TACROLIMUS 1 MG CAPSULE: 1 | 30 days supply | Qty: 240 | Fill #4 | Status: TO

## 2019-01-23 MED FILL — SULFAMETHOXAZOLE-TMP SS TAB: 400-80 | 84 days supply | Qty: 36 | Fill #0

## 2019-01-23 MED FILL — CARVEDILOL 12.5 MG TABLET: 12.5 | 90 days supply | Qty: 180 | Fill #0

## 2019-01-23 MED FILL — MYCOPHENOLIC ACID DR 180 MG: 180 | 30 days supply | Qty: 240 | Fill #3

## 2019-01-29 ENCOUNTER — Encounter (INDEPENDENT_AMBULATORY_CARE_PROVIDER_SITE_OTHER): Payer: Self-pay | Admitting: Physical Medicine and Rehabilitation

## 2019-02-08 DIAGNOSIS — J309 Allergic rhinitis, unspecified: Secondary | ICD-10-CM | POA: Diagnosis not present

## 2019-02-08 DIAGNOSIS — H6982 Other specified disorders of Eustachian tube, left ear: Secondary | ICD-10-CM | POA: Diagnosis not present

## 2019-02-12 ENCOUNTER — Other Ambulatory Visit: Payer: Self-pay | Admitting: Internal Medicine

## 2019-02-14 ENCOUNTER — Other Ambulatory Visit: Payer: Self-pay | Admitting: Pharmacist

## 2019-02-14 MED ORDER — MYCOPHENOLATE SODIUM 180 MG PO TBEC: 720.0000 mg | DELAYED_RELEASE_TABLET | Freq: Two times a day (BID) | ORAL | 5 refills | Status: DC

## 2019-02-14 MED FILL — MYCOPHENOLATE SODIUM 180 MG: 180 | 30 days supply | Qty: 240 | Fill #0

## 2019-02-19 MED FILL — TACROLIMUS 1 MG CAPSULE: 1 | 30 days supply | Qty: 240 | Fill #0

## 2019-02-21 ENCOUNTER — Encounter (INDEPENDENT_AMBULATORY_CARE_PROVIDER_SITE_OTHER): Payer: Self-pay | Admitting: Physical Medicine and Rehabilitation

## 2019-02-27 ENCOUNTER — Other Ambulatory Visit: Payer: Self-pay | Admitting: Vascular Surgery

## 2019-02-27 ENCOUNTER — Encounter (INDEPENDENT_AMBULATORY_CARE_PROVIDER_SITE_OTHER): Payer: Medicare Other | Admitting: Physical Medicine and Rehabilitation

## 2019-03-01 ENCOUNTER — Other Ambulatory Visit: Payer: Self-pay

## 2019-03-01 ENCOUNTER — Encounter: Payer: Self-pay | Admitting: Vascular Surgery

## 2019-03-01 ENCOUNTER — Ambulatory Visit (INDEPENDENT_AMBULATORY_CARE_PROVIDER_SITE_OTHER)
Admission: RE | Admit: 2019-03-01 | Discharge: 2019-03-01 | Disposition: A | Discharge status: Home / Self Care | Payer: 59 | Source: Ambulatory Visit | Attending: Family | Admitting: Family

## 2019-03-01 ENCOUNTER — Ambulatory Visit (INDEPENDENT_AMBULATORY_CARE_PROVIDER_SITE_OTHER): Payer: 59 | Admitting: Vascular Surgery

## 2019-03-01 ENCOUNTER — Ambulatory Visit (HOSPITAL_COMMUNITY)
Admission: RE | Admit: 2019-03-01 | Discharge: 2019-03-01 | Disposition: A | Discharge status: Home / Self Care | Payer: 59 | Source: Ambulatory Visit | Attending: Family | Admitting: Family

## 2019-03-01 VITALS — BP 121/80 | HR 77 | Resp 20 | Ht 70.0 in | Wt 195.0 lb

## 2019-03-01 DIAGNOSIS — I70219 Atherosclerosis of native arteries of extremities with intermittent claudication, unspecified extremity: Secondary | ICD-10-CM | POA: Diagnosis not present

## 2019-03-01 DIAGNOSIS — I779 Disorder of arteries and arterioles, unspecified: Secondary | ICD-10-CM | POA: Diagnosis not present

## 2019-03-01 DIAGNOSIS — Z8639 Personal history of other endocrine, nutritional and metabolic disease: Secondary | ICD-10-CM | POA: Diagnosis not present

## 2019-03-01 NOTE — Progress Notes (Signed)
Patient ID: Gregory Allen, male   DOB: December 18, 1970, 48 y.o.   MRN: [id number]  Reason for Consult: Follow-up   Referred by Velna Hatchet, MD  Subjective:     HPI:  Gregory Allen is a 48 y.o. 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  Has lower extremity pain.  States he gets really bad pain with walking also has numbness in his feet.  Has been told he has vascular disease in the past.  Recently fell and had buttock pain this is now resolving.  Did start taking aspirin after our visit last year.  No tissue loss or ulceration.  Not frankly claudicating does not have cramps but has significant burning pain going into his feet that will last for significant amount of time after exacerbated with activity.  Past Medical History:  Diagnosis Date  . Diabetes mellitus without complication ([place])    History reviewed. No pertinent family history. Past Surgical History:  Procedure Laterality Date  . KIDNEY TRANSPLANT    . TRANSPLANT PANCREATIC ALLOGRAFT      Short Social History:  Social History   Tobacco Use  . Smoking status: Never Smoker  . Smokeless tobacco: Never Used  Substance Use Topics  . Alcohol use: No    Alcohol/week: 0.0 standard drinks    Allergies  Allergen Reactions  . Shellfish-Derived Products Itching    "throat starts itching" pt states "he is ok with iodine"  . Other Other (See Comments)    Shellfish per pt  . Shellfish Allergy     Current Outpatient Medications  Medication Sig Dispense Refill  . amLODipine (NORVASC) 10 MG tablet     . aspirin EC 81 MG tablet Take by mouth.    . carvedilol (COREG) 12.5 MG tablet Take 12.5 mg by mouth 2 (two) times daily with a meal.    . mycophenolate (MYFORTIC) 180 MG EC tablet Take 4 tablets (720 mg total) by mouth 2 (two) times daily. 240 tablet 5  . predniSONE (DELTASONE) 5 MG tablet Take by mouth.    . sulfamethoxazole-trimethoprim (BACTRIM,SEPTRA) 400-80 MG tablet  TAKE 1 TABLET BY MOUTH EVERY MONDAY, WENESDAY, AND FRIDAY  0  . tacrolimus (PROGRAF) 1 MG capsule Take by mouth.     No current facility-administered medications for this visit.     Review of Systems  Constitutional:  Constitutional negative. HENT: HENT negative.  Eyes: Eyes negative.  Respiratory: Respiratory negative.  Cardiovascular: Positive for claudication and leg swelling.  GI: Gastrointestinal negative.  Musculoskeletal: Positive for leg pain and joint pain.  Skin: Skin negative.  Neurological: Neurological negative. Hematologic: Hematologic/lymphatic negative.  Psychiatric: Psychiatric negative.        Objective:  Objective   Vitals:   03/01/19 1307  BP: 121/80  Pulse: 77  Resp: 20  SpO2: 98%  Weight: 195 lb (88.5 kg)  Height: 5\' 10"  (1.778 m)   Body mass index is 27.98 kg/m.  Physical Exam Constitutional:      Appearance: Normal appearance.  HENT:     Head: Normocephalic.     Mouth/Throat:     Mouth: Mucous membranes are moist.  Eyes:     Pupils: Pupils are equal, round, and reactive to light.  Cardiovascular:     Pulses:          Radial pulses are 2+ on the right side and 2+ on the left side.       Popliteal pulses are 2+ on the right side and 2+  on the left side.       Dorsalis pedis pulses are 0 on the right side and 0 on the left side.       Posterior tibial pulses are 0 on the right side and 0 on the left side.  Abdominal:     General: Abdomen is flat.     Palpations: Abdomen is soft. There is no mass.  Musculoskeletal:        General: No swelling or deformity.  Skin:    General: Skin is warm and dry.     Capillary Refill: Capillary refill takes 2 to 3 seconds.  Neurological:     General: No focal deficit present.     Mental Status: He is alert.  Psychiatric:        Mood and Affect: Mood normal.        Behavior: Behavior normal.        Thought Content: Thought content normal.        Judgment: Judgment normal.     Data: I have  independently interpreted his ABIs to be monophasic on the right with toe pressure 53 on the left has a triphasic anterior tibial with a toe pressure of 76.  I have independently interpreted his right lower extremity and left lower extremity duplex which demonstrates nearly normal vessels throughout although his anterior tibial artery distally and posterior tibial artery distally are monophasic on the right and his posterior tibial on the left is monophasic distally.     Assessment/Plan:    48 year old male with history of diabetes now with kidney and pancreas transplant.  Is likely multifactorial pain bilateral lower extremities right greater than left.  Does not have tissue loss or ulceration.  Given previous kidney transplant I would not recommend angiography.  If he does have treatment would likely be done at [facility] given his past.  He can follow-up in 1 year with repeat ABIs.      Waynetta Sandy MD [facility]

## 2019-03-07 DIAGNOSIS — Z9483 Pancreas transplant status: Secondary | ICD-10-CM | POA: Diagnosis not present

## 2019-03-07 DIAGNOSIS — I739 Peripheral vascular disease, unspecified: Secondary | ICD-10-CM | POA: Diagnosis not present

## 2019-03-07 DIAGNOSIS — I129 Hypertensive chronic kidney disease with stage 1 through stage 4 chronic kidney disease, or unspecified chronic kidney disease: Secondary | ICD-10-CM | POA: Diagnosis not present

## 2019-03-07 DIAGNOSIS — Z940 Kidney transplant status: Secondary | ICD-10-CM | POA: Diagnosis not present

## 2019-03-07 DIAGNOSIS — G629 Polyneuropathy, unspecified: Secondary | ICD-10-CM | POA: Diagnosis not present

## 2019-03-08 ENCOUNTER — Other Ambulatory Visit: Payer: Self-pay

## 2019-03-08 DIAGNOSIS — I779 Disorder of arteries and arterioles, unspecified: Secondary | ICD-10-CM

## 2019-03-08 MED ORDER — CILOSTAZOL 100 MG PO TABS: 100.0000 mg | ORAL_TABLET | Freq: Two times a day (BID) | ORAL | 11 refills | Status: DC

## 2019-03-08 MED FILL — CILOSTAZOL 100 MG TABLET: 100 | 30 days supply | Qty: 60 | Fill #0

## 2019-03-19 DIAGNOSIS — Z125 Encounter for screening for malignant neoplasm of prostate: Secondary | ICD-10-CM | POA: Diagnosis not present

## 2019-03-19 DIAGNOSIS — I10 Essential (primary) hypertension: Secondary | ICD-10-CM | POA: Diagnosis not present

## 2019-03-19 DIAGNOSIS — E109 Type 1 diabetes mellitus without complications: Secondary | ICD-10-CM | POA: Diagnosis not present

## 2019-03-19 DIAGNOSIS — R82998 Other abnormal findings in urine: Secondary | ICD-10-CM | POA: Diagnosis not present

## 2019-03-20 ENCOUNTER — Encounter: Payer: Self-pay | Admitting: Physical Medicine and Rehabilitation

## 2019-03-20 ENCOUNTER — Ambulatory Visit (INDEPENDENT_AMBULATORY_CARE_PROVIDER_SITE_OTHER): Payer: 59 | Admitting: Physical Medicine and Rehabilitation

## 2019-03-20 ENCOUNTER — Other Ambulatory Visit: Payer: Self-pay

## 2019-03-20 ENCOUNTER — Ambulatory Visit: Payer: Self-pay

## 2019-03-20 VITALS — BP 124/83 | HR 74 | Temp 98.2°F

## 2019-03-20 DIAGNOSIS — M5116 Intervertebral disc disorders with radiculopathy, lumbar region: Secondary | ICD-10-CM

## 2019-03-20 DIAGNOSIS — M961 Postlaminectomy syndrome, not elsewhere classified: Secondary | ICD-10-CM

## 2019-03-20 DIAGNOSIS — M5416 Radiculopathy, lumbar region: Secondary | ICD-10-CM | POA: Diagnosis not present

## 2019-03-20 MED ORDER — BETAMETHASONE SOD PHOS & ACET 6 (3-3) MG/ML IJ SUSP
12.0000 mg | Freq: Once | INTRAMUSCULAR | Status: AC
  Administered 2019-03-20: 12 mg

## 2019-03-20 MED FILL — TACROLIMUS 1 MG CAPSULE: 1 | 30 days supply | Qty: 240 | Fill #0

## 2019-03-20 NOTE — Progress Notes (Signed)
 .  Numeric Pain Rating Scale and Functional Assessment Average Pain 10   In the last MONTH (on 0-10 scale) has pain interfered with the following?  1. General activity like being  able to carry out your everyday physical activities such as walking, climbing stairs, carrying groceries, or moving a chair?  Rating(6)   +Driver, -BT, -Dye Allergies.  

## 2019-03-20 NOTE — Progress Notes (Signed)
Gregory Allen - 48 y.o. male MRN [id number]  Date of birth: Apr 11, 1971  Office Visit Note: Visit Date: 03/20/2019 PCP: Velna Hatchet, MD Referred by: Velna Hatchet, MD  Subjective: Chief Complaint  Patient presents with  . Lower Back - Pain  . Right Hip - Pain   HPI:  Fransico Sciandra is a 48 y.o. male who comes in today At the request of G. Alphonzo Severance, M.D. for left S1 transforaminal epidural steroid injection.  Patient's had prior laminectomy discectomy L5-S1 with continued central disc protrusion without frank nerve compression and some bilateral foraminal narrowing.  Has been having left hip pain for several months and recently saw Dr. Marlou Sa February.  MRI was completed and reviewed below.  He is failed other conservative care at this point.  His case is very complicated has had both a pancreas and kidney transplant as well as peripheral arterial disease.  ROS Otherwise per HPI.  Assessment & Plan: Visit Diagnoses:  1. Lumbar radiculopathy   2. Post laminectomy syndrome   3. Radiculopathy due to lumbar intervertebral disc disorder     Plan: No additional findings.   Meds & Orders:  Meds ordered this encounter  Medications  . betamethasone acetate-betamethasone sodium phosphate (CELESTONE) injection 12 mg    Orders Placed This Encounter  Procedures  . XR C-ARM NO REPORT  . Epidural Steroid injection    Follow-up: Return if symptoms worsen or fail to improve.   Procedures: No procedures performed  S1 Lumbosacral Transforaminal Epidural Steroid Injection - Sub-Pedicular Approach with Fluoroscopic Guidance   Patient: Todd Jelinski      Date of Birth: [birth year] MRN: [id number] PCP: Velna Hatchet, MD      Visit Date: 03/20/2019   Universal Protocol:    Date/Time: 05/21/205:58 AM  Consent Given By: the patient  Position:  PRONE  Additional Comments: Vital signs were monitored before and after the procedure. Patient was prepped and draped in the  usual sterile fashion. The correct patient, procedure, and site was verified.   Injection Procedure Details:  Procedure Site One Meds Administered:  Meds ordered this encounter  Medications  . betamethasone acetate-betamethasone sodium phosphate (CELESTONE) injection 12 mg    Laterality: Left  Location/Site:  S1 Foramen   Needle size: 22 ga.  Needle type: Spinal  Needle Placement: Transforaminal  Findings:   -Comments: Excellent flow of contrast along the nerve and into the epidural space.  Procedure Details: After squaring off the sacral end-plate to get a true AP view, the C-arm was positioned so that the best possible view of the S1 foramen was visualized. The soft tissues overlying this structure were infiltrated with 2-3 ml. of 1% Lidocaine without Epinephrine.    The spinal needle was inserted toward the target using a "trajectory" view along the fluoroscope beam.  Under AP and lateral visualization, the needle was advanced so it did not puncture dura. Biplanar projections were used to confirm position. Aspiration was confirmed to be negative for CSF and/or blood. A 1-2 ml. volume of Isovue-250 was injected and flow of contrast was noted at each level. Radiographs were obtained for documentation purposes.   After attaining the desired flow of contrast documented above, a 0.5 to 1.0 ml test dose of 0.25% Marcaine was injected into each respective transforaminal space.  The patient was observed for 90 seconds post injection.  After no sensory deficits were reported, and normal lower extremity motor function was noted,   the above injectate was administered so that equal amounts  of the injectate were placed at each foramen (level) into the transforaminal epidural space.   Additional Comments:  The patient tolerated the procedure well Dressing: Band-Aid with 2 x 2 sterile gauze    Post-procedure details: Patient was observed during the procedure. Post-procedure  instructions were reviewed.  Patient left the clinic in stable condition.    Clinical History: MRI LUMBAR SPINE WITHOUT CONTRAST  TECHNIQUE: Multiplanar, multisequence MR imaging of the lumbar spine was performed. No intravenous contrast was administered.  COMPARISON:  Radiography 12/21/2018  FINDINGS: Segmentation:  5 lumbar type vertebral bodies.  Alignment:  Curvature convex to the right with the apex at L2-3.  Vertebrae:  No fracture or primary bone lesion.  Conus medullaris and cauda equina: Conus extends to the L1 level. Conus and cauda equina appear normal.  Paraspinal and other soft tissues: Negative  Disc levels:  No significant finding at L3-4 or above.  L4-5: Mild bulging of the disc. Mild facet and ligamentous hypertrophy. No compressive central canal narrowing. Mild bilateral foraminal stenosis but without definite compression of the exiting L3 nerves.  L5-S1: Previous partial right hemilaminectomy. Central disc herniation contacts the thecal sac in the S1 root sleeves. Compressive stenosis is not demonstrated. There is foraminal stenosis on the right due to encroachment by facet osteophytes, endplate osteophytes and bulging disc material that could affect the exiting right L5 nerve.  IMPRESSION: Curvature convex to the right with the apex at L2-3.  L4-5: Disc bulge. Facet degeneration and hypertrophy. Mild bilateral foraminal stenosis which could affect either L4 nerve. The facet osteoarthritis could be symptomatic.  L5-S1: Central disc herniation but without neural compression. This could cause neural irritation. Previous right hemilaminectomy. Right foraminal stenosis due to encroachment by endplate osteophytes, bulging disc and facet osteophytes could affect the exiting right L5 nerve.   Electronically Signed   By: Nelson Chimes M.D.   On: 01/04/2019 12:50     Objective:  VS:  HT:    WT:   BMI:     BP:124/83  HR:74bpm   TEMP:98.2 F (36.8 C)(Temporal)  RESP:  Physical Exam  Ortho Exam Imaging: No results found.

## 2019-03-26 DIAGNOSIS — E109 Type 1 diabetes mellitus without complications: Secondary | ICD-10-CM | POA: Diagnosis not present

## 2019-03-26 DIAGNOSIS — K219 Gastro-esophageal reflux disease without esophagitis: Secondary | ICD-10-CM | POA: Diagnosis not present

## 2019-03-26 DIAGNOSIS — E1121 Type 2 diabetes mellitus with diabetic nephropathy: Secondary | ICD-10-CM | POA: Diagnosis not present

## 2019-03-26 DIAGNOSIS — Z0000 Encounter for general adult medical examination without abnormal findings: Secondary | ICD-10-CM | POA: Diagnosis not present

## 2019-03-26 DIAGNOSIS — Z1331 Encounter for screening for depression: Secondary | ICD-10-CM | POA: Diagnosis not present

## 2019-03-26 DIAGNOSIS — I739 Peripheral vascular disease, unspecified: Secondary | ICD-10-CM | POA: Diagnosis not present

## 2019-03-26 DIAGNOSIS — I10 Essential (primary) hypertension: Secondary | ICD-10-CM | POA: Diagnosis not present

## 2019-03-26 DIAGNOSIS — Z9489 Other transplanted organ and tissue status: Secondary | ICD-10-CM | POA: Diagnosis not present

## 2019-03-26 DIAGNOSIS — D8989 Other specified disorders involving the immune mechanism, not elsewhere classified: Secondary | ICD-10-CM | POA: Diagnosis not present

## 2019-03-26 MED FILL — FLUTICASONE PROP 50 MCG SPR: 50 | 30 days supply | Qty: 16 | Fill #0

## 2019-03-28 MED FILL — MYCOPHENOLATE SODIUM 180 MG: 180 | 30 days supply | Qty: 240 | Fill #1

## 2019-03-28 NOTE — Procedures (Signed)
S1 Lumbosacral Transforaminal Epidural Steroid Injection - Sub-Pedicular Approach with Fluoroscopic Guidance   Patient: Gregory Allen      Date of Birth: May 10, 1971 MRN: [id number] PCP: Velna Hatchet, MD      Visit Date: 03/20/2019   Universal Protocol:    Date/Time: 05/21/205:58 AM  Consent Given By: the patient  Position:  PRONE  Additional Comments: Vital signs were monitored before and after the procedure. Patient was prepped and draped in the usual sterile fashion. The correct patient, procedure, and site was verified.   Injection Procedure Details:  Procedure Site One Meds Administered:  Meds ordered this encounter  Medications  . betamethasone acetate-betamethasone sodium phosphate (CELESTONE) injection 12 mg    Laterality: Left  Location/Site:  S1 Foramen   Needle size: 22 ga.  Needle type: Spinal  Needle Placement: Transforaminal  Findings:   -Comments: Excellent flow of contrast along the nerve and into the epidural space.  Procedure Details: After squaring off the sacral end-plate to get a true AP view, the C-arm was positioned so that the best possible view of the S1 foramen was visualized. The soft tissues overlying this structure were infiltrated with 2-3 ml. of 1% Lidocaine without Epinephrine.    The spinal needle was inserted toward the target using a "trajectory" view along the fluoroscope beam.  Under AP and lateral visualization, the needle was advanced so it did not puncture dura. Biplanar projections were used to confirm position. Aspiration was confirmed to be negative for CSF and/or blood. A 1-2 ml. volume of Isovue-250 was injected and flow of contrast was noted at each level. Radiographs were obtained for documentation purposes.   After attaining the desired flow of contrast documented above, a 0.5 to 1.0 ml test dose of 0.25% Marcaine was injected into each respective transforaminal space.  The patient was observed for 90 seconds post  injection.  After no sensory deficits were reported, and normal lower extremity motor function was noted,   the above injectate was administered so that equal amounts of the injectate were placed at each foramen (level) into the transforaminal epidural space.   Additional Comments:  The patient tolerated the procedure well Dressing: Band-Aid with 2 x 2 sterile gauze    Post-procedure details: Patient was observed during the procedure. Post-procedure instructions were reviewed.  Patient left the clinic in stable condition.

## 2019-04-18 MED FILL — CARVEDILOL 12.5 MG TABLET: 12.5 | 90 days supply | Qty: 180 | Fill #1

## 2019-04-22 MED FILL — TACROLIMUS 1 MG CAPSULE: 1 | 30 days supply | Qty: 240 | Fill #0

## 2019-04-22 MED FILL — AMLODIPINE BESYLATE 10 MG T: 10 | 90 days supply | Qty: 90 | Fill #1

## 2019-04-22 MED FILL — MYCOPHENOLIC ACID DR 180 MG: 180 | 30 days supply | Qty: 240 | Fill #0

## 2019-04-22 MED FILL — SULFAMETHOXAZOLE-TMP SS TAB: 400-80 | 84 days supply | Qty: 36 | Fill #1

## 2019-04-23 MED FILL — predniSONE 5 MG TABS: 5 | 30 days supply | Qty: 30 | Fill #0

## 2019-04-30 DIAGNOSIS — K219 Gastro-esophageal reflux disease without esophagitis: Secondary | ICD-10-CM | POA: Diagnosis not present

## 2019-04-30 DIAGNOSIS — Z7952 Long term (current) use of systemic steroids: Secondary | ICD-10-CM | POA: Diagnosis not present

## 2019-04-30 DIAGNOSIS — Z9483 Pancreas transplant status: Secondary | ICD-10-CM | POA: Diagnosis not present

## 2019-04-30 DIAGNOSIS — N289 Disorder of kidney and ureter, unspecified: Secondary | ICD-10-CM | POA: Diagnosis not present

## 2019-04-30 DIAGNOSIS — Z48288 Encounter for aftercare following multiple organ transplant: Secondary | ICD-10-CM | POA: Diagnosis not present

## 2019-04-30 DIAGNOSIS — I10 Essential (primary) hypertension: Secondary | ICD-10-CM | POA: Diagnosis not present

## 2019-04-30 DIAGNOSIS — Z792 Long term (current) use of antibiotics: Secondary | ICD-10-CM | POA: Diagnosis not present

## 2019-04-30 DIAGNOSIS — Z940 Kidney transplant status: Secondary | ICD-10-CM | POA: Diagnosis not present

## 2019-04-30 DIAGNOSIS — Z79899 Other long term (current) drug therapy: Secondary | ICD-10-CM | POA: Diagnosis not present

## 2019-04-30 DIAGNOSIS — E1040 Type 1 diabetes mellitus with diabetic neuropathy, unspecified: Secondary | ICD-10-CM | POA: Diagnosis not present

## 2019-05-06 DIAGNOSIS — R309 Painful micturition, unspecified: Secondary | ICD-10-CM | POA: Diagnosis not present

## 2019-05-06 DIAGNOSIS — Z4822 Encounter for aftercare following kidney transplant: Secondary | ICD-10-CM | POA: Diagnosis not present

## 2019-05-06 DIAGNOSIS — Z79899 Other long term (current) drug therapy: Secondary | ICD-10-CM | POA: Diagnosis not present

## 2019-05-08 ENCOUNTER — Ambulatory Visit: Payer: 59 | Attending: Family Medicine | Admitting: Pharmacist

## 2019-05-08 ENCOUNTER — Other Ambulatory Visit: Payer: Self-pay

## 2019-05-08 DIAGNOSIS — Z940 Kidney transplant status: Secondary | ICD-10-CM | POA: Diagnosis not present

## 2019-05-08 DIAGNOSIS — Z9483 Pancreas transplant status: Secondary | ICD-10-CM | POA: Diagnosis not present

## 2019-05-08 DIAGNOSIS — Z4822 Encounter for aftercare following kidney transplant: Secondary | ICD-10-CM | POA: Diagnosis not present

## 2019-05-08 DIAGNOSIS — Z79899 Other long term (current) drug therapy: Secondary | ICD-10-CM

## 2019-05-08 DIAGNOSIS — Z48298 Encounter for aftercare following other organ transplant: Secondary | ICD-10-CM | POA: Diagnosis not present

## 2019-05-08 NOTE — Progress Notes (Signed)
   S: Patient presents today to the [facility] for review of his specialty medication.   Patient is currently taking mycophenolate for kidney and pancreas transplant. 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 Patient is managed by [facility] for this. 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   Adherence: denies any missed doses  Efficacy: working well after most recent transplant. Most recently saw Dr. Linward Foster on 04/30/19. Dr. Linward Foster noted that the patient was doing well.   Dose: 180 mg EC; takes 2 tablets (360) by mouth BID. This was decreased by Dr. Linward Foster on 05/02/19.   Adverse effects: Infection: denies Cardiovascular effects (hyper/hypotension, edema, tachycardia): denies CNS effects (pain, headache, insomnia, dizziness): occasional mild HA - tx's with Tylenol GI effects (N/V/D, pain): denies Hematologic effects: denies  O:  Most recent blood work [facility]06/[facility]. Per Care Everywhere:  CMP:  (Abnormalities have been bolded) Na: 138 K: 4.5 Cl: 106 CO2: 23 BUN 19 Glucose: 104 (H) Creatinine: 1.19 Ca: 9.5 Total protein: 7.0 Albumin 4.6 Tbili: 0.5 Alk Phos: 55 AST: 12 ALT: 16 Anion gap: 9 GFR: 83  CBC w/ differential: (Abnormalities have been bolded) WBC: 6.1  RBC: 5.01 Hgb: 13.1 (L) Hct: 40.1 (L) MCV: 80.0 MCH: 26.1 (L) MCHC: 32.6 (L) RDW: 14.8 (H) Platelets: 273,000 MPV: 7.7 Neutrophil %: 81 Lymphocyte %: 10 Monocyte %: 7 Eosinophil %: 1  Basophil %: 1 Neutrophil absolute: 4.9 Lymphocyte absolute: 0.6 (L) Monocyte absolute: 0.4 Eosinophil absolute: 0.1 Basophil absolute: 0.1  A/P: 1. Medication review: Patient is currently taking mycophenolate s/p kidney transplant and is tolerating it well with minimal adverse effects. Reviewed the medication with patient, including the  following: mycophenolate is an immunosuppressant agent that has a cytostatic effect on T and B lymphocytes. Patient should have CBC regularly monitored as well as renal and liver function. Patient is at increased risk of infection and should alert transplant clinic to s/sx of infection. Patient should not donate blood or semen while on this medication. Possible adverse effects include increased risk of infection, cardiac adverse effects, CNS adverse effects, GI adverse effects, and hematologic adverse effects. Adherence is critical to avoid organ rejection. Recommended for him to let his doctor know to send updated rx to [facility].   Benard Halsted, PharmD, [place] [phone]

## 2019-05-13 DIAGNOSIS — Z4822 Encounter for aftercare following kidney transplant: Secondary | ICD-10-CM | POA: Diagnosis not present

## 2019-05-13 DIAGNOSIS — Z79899 Other long term (current) drug therapy: Secondary | ICD-10-CM | POA: Diagnosis not present

## 2019-05-14 MED FILL — TACROLIMUS 1 MG CAPS: 1 | 30 days supply | Qty: 150 | Fill #0

## 2019-05-17 MED FILL — predniSONE 5 MG TABS: 5 | 30 days supply | Qty: 30 | Fill #0

## 2019-05-21 DIAGNOSIS — Z4822 Encounter for aftercare following kidney transplant: Secondary | ICD-10-CM | POA: Diagnosis not present

## 2019-05-28 DIAGNOSIS — Z79899 Other long term (current) drug therapy: Secondary | ICD-10-CM | POA: Diagnosis not present

## 2019-05-28 DIAGNOSIS — B349 Viral infection, unspecified: Secondary | ICD-10-CM | POA: Diagnosis not present

## 2019-05-28 DIAGNOSIS — I10 Essential (primary) hypertension: Secondary | ICD-10-CM | POA: Diagnosis not present

## 2019-05-28 DIAGNOSIS — Z5181 Encounter for therapeutic drug level monitoring: Secondary | ICD-10-CM | POA: Diagnosis not present

## 2019-05-28 DIAGNOSIS — Z48288 Encounter for aftercare following multiple organ transplant: Secondary | ICD-10-CM | POA: Diagnosis not present

## 2019-05-28 DIAGNOSIS — Z9483 Pancreas transplant status: Secondary | ICD-10-CM | POA: Diagnosis not present

## 2019-05-28 DIAGNOSIS — Z940 Kidney transplant status: Secondary | ICD-10-CM | POA: Diagnosis not present

## 2019-05-31 DIAGNOSIS — Z48288 Encounter for aftercare following multiple organ transplant: Secondary | ICD-10-CM | POA: Diagnosis not present

## 2019-05-31 DIAGNOSIS — D8989 Other specified disorders involving the immune mechanism, not elsewhere classified: Secondary | ICD-10-CM | POA: Diagnosis not present

## 2019-05-31 DIAGNOSIS — Z9483 Pancreas transplant status: Secondary | ICD-10-CM | POA: Diagnosis not present

## 2019-05-31 DIAGNOSIS — I10 Essential (primary) hypertension: Secondary | ICD-10-CM | POA: Diagnosis not present

## 2019-05-31 DIAGNOSIS — E109 Type 1 diabetes mellitus without complications: Secondary | ICD-10-CM | POA: Diagnosis not present

## 2019-05-31 DIAGNOSIS — G629 Polyneuropathy, unspecified: Secondary | ICD-10-CM | POA: Diagnosis not present

## 2019-05-31 DIAGNOSIS — D649 Anemia, unspecified: Secondary | ICD-10-CM | POA: Diagnosis not present

## 2019-05-31 DIAGNOSIS — B349 Viral infection, unspecified: Secondary | ICD-10-CM | POA: Diagnosis not present

## 2019-05-31 DIAGNOSIS — Z79899 Other long term (current) drug therapy: Secondary | ICD-10-CM | POA: Diagnosis not present

## 2019-05-31 DIAGNOSIS — K219 Gastro-esophageal reflux disease without esophagitis: Secondary | ICD-10-CM | POA: Diagnosis not present

## 2019-05-31 DIAGNOSIS — Z792 Long term (current) use of antibiotics: Secondary | ICD-10-CM | POA: Diagnosis not present

## 2019-05-31 DIAGNOSIS — Z940 Kidney transplant status: Secondary | ICD-10-CM | POA: Diagnosis not present

## 2019-05-31 DIAGNOSIS — Z7952 Long term (current) use of systemic steroids: Secondary | ICD-10-CM | POA: Diagnosis not present

## 2019-05-31 DIAGNOSIS — E871 Hypo-osmolality and hyponatremia: Secondary | ICD-10-CM | POA: Diagnosis not present

## 2019-06-03 ENCOUNTER — Other Ambulatory Visit: Payer: Self-pay | Admitting: Pharmacist

## 2019-06-03 MED ORDER — MYCOPHENOLATE SODIUM 180 MG PO TBEC: 180.0000 mg | DELAYED_RELEASE_TABLET | Freq: Two times a day (BID) | ORAL | 5 refills | Status: DC

## 2019-06-03 MED FILL — MYCOPHENOLIC ACID DR 180 MG: 180 | 30 days supply | Qty: 60 | Fill #0

## 2019-06-03 NOTE — Telephone Encounter (Signed)
Dose change per transplant physician. Patient aware.

## 2019-06-04 DIAGNOSIS — Z48288 Encounter for aftercare following multiple organ transplant: Secondary | ICD-10-CM | POA: Diagnosis not present

## 2019-06-04 DIAGNOSIS — Z79899 Other long term (current) drug therapy: Secondary | ICD-10-CM | POA: Diagnosis not present

## 2019-06-06 MED FILL — azaTHIOprine 50 MG TABS: 50 | 30 days supply | Qty: 15 | Fill #0

## 2019-06-11 DIAGNOSIS — Z48288 Encounter for aftercare following multiple organ transplant: Secondary | ICD-10-CM | POA: Diagnosis not present

## 2019-06-11 DIAGNOSIS — Z79899 Other long term (current) drug therapy: Secondary | ICD-10-CM | POA: Diagnosis not present

## 2019-06-11 DIAGNOSIS — Z940 Kidney transplant status: Secondary | ICD-10-CM | POA: Diagnosis not present

## 2019-06-11 DIAGNOSIS — Z9483 Pancreas transplant status: Secondary | ICD-10-CM | POA: Diagnosis not present

## 2019-06-12 MED FILL — TACROLIMUS 1 MG CAPSULE: 1 | 30 days supply | Qty: 150 | Fill #0

## 2019-06-18 DIAGNOSIS — Z9483 Pancreas transplant status: Secondary | ICD-10-CM | POA: Diagnosis not present

## 2019-06-18 DIAGNOSIS — Z79899 Other long term (current) drug therapy: Secondary | ICD-10-CM | POA: Diagnosis not present

## 2019-06-18 DIAGNOSIS — Z5181 Encounter for therapeutic drug level monitoring: Secondary | ICD-10-CM | POA: Diagnosis not present

## 2019-06-18 DIAGNOSIS — Z940 Kidney transplant status: Secondary | ICD-10-CM | POA: Diagnosis not present

## 2019-07-01 DIAGNOSIS — Z940 Kidney transplant status: Secondary | ICD-10-CM | POA: Diagnosis not present

## 2019-07-01 DIAGNOSIS — Z79899 Other long term (current) drug therapy: Secondary | ICD-10-CM | POA: Diagnosis not present

## 2019-07-01 DIAGNOSIS — Z48288 Encounter for aftercare following multiple organ transplant: Secondary | ICD-10-CM | POA: Diagnosis not present

## 2019-07-01 DIAGNOSIS — Z9483 Pancreas transplant status: Secondary | ICD-10-CM | POA: Diagnosis not present

## 2019-07-01 MED FILL — TACROLIMUS 1 MG CAPSULE: 1 | 30 days supply | Qty: 180 | Fill #0

## 2019-07-01 MED FILL — MYCOPHENOLIC ACID DR 180 MG: 180 | 30 days supply | Qty: 60 | Fill #1

## 2019-07-02 MED FILL — predniSONE 5 MG TABS: 5 | 30 days supply | Qty: 30 | Fill #1

## 2019-07-12 MED FILL — CARVEDILOL 12.5 MG TABLET: 12.5 | 90 days supply | Qty: 180 | Fill #2

## 2019-07-12 MED FILL — SULFAMETHOXAZOLE-TMP SS TAB: 400-80 | 28 days supply | Qty: 12 | Fill #0

## 2019-08-09 MED FILL — SULFAMETHOXAZOLE-TMP SS TAB: 400-80 | 28 days supply | Qty: 12 | Fill #1

## 2019-08-09 MED FILL — predniSONE 5 MG TABS: 5 | 30 days supply | Qty: 30 | Fill #2

## 2019-08-12 DIAGNOSIS — K5909 Other constipation: Secondary | ICD-10-CM | POA: Diagnosis not present

## 2019-08-12 DIAGNOSIS — Z794 Long term (current) use of insulin: Secondary | ICD-10-CM | POA: Diagnosis not present

## 2019-08-12 DIAGNOSIS — Z940 Kidney transplant status: Secondary | ICD-10-CM | POA: Diagnosis not present

## 2019-08-12 DIAGNOSIS — E1022 Type 1 diabetes mellitus with diabetic chronic kidney disease: Secondary | ICD-10-CM | POA: Diagnosis not present

## 2019-08-12 DIAGNOSIS — Z9483 Pancreas transplant status: Secondary | ICD-10-CM | POA: Diagnosis not present

## 2019-08-12 DIAGNOSIS — D8989 Other specified disorders involving the immune mechanism, not elsewhere classified: Secondary | ICD-10-CM | POA: Diagnosis not present

## 2019-08-12 DIAGNOSIS — I129 Hypertensive chronic kidney disease with stage 1 through stage 4 chronic kidney disease, or unspecified chronic kidney disease: Secondary | ICD-10-CM | POA: Diagnosis not present

## 2019-08-12 DIAGNOSIS — Z4822 Encounter for aftercare following kidney transplant: Secondary | ICD-10-CM | POA: Diagnosis not present

## 2019-08-12 DIAGNOSIS — K219 Gastro-esophageal reflux disease without esophagitis: Secondary | ICD-10-CM | POA: Diagnosis not present

## 2019-08-12 DIAGNOSIS — Z79899 Other long term (current) drug therapy: Secondary | ICD-10-CM | POA: Diagnosis not present

## 2019-08-12 MED FILL — TACROLIMUS 1 MG CAPS: 1 | 30 days supply | Qty: 240 | Fill #0

## 2019-08-14 DIAGNOSIS — B348 Other viral infections of unspecified site: Secondary | ICD-10-CM | POA: Diagnosis not present

## 2019-08-14 DIAGNOSIS — Z9483 Pancreas transplant status: Secondary | ICD-10-CM | POA: Diagnosis not present

## 2019-08-14 DIAGNOSIS — I739 Peripheral vascular disease, unspecified: Secondary | ICD-10-CM | POA: Diagnosis not present

## 2019-08-14 DIAGNOSIS — Z940 Kidney transplant status: Secondary | ICD-10-CM | POA: Diagnosis not present

## 2019-08-14 DIAGNOSIS — I129 Hypertensive chronic kidney disease with stage 1 through stage 4 chronic kidney disease, or unspecified chronic kidney disease: Secondary | ICD-10-CM | POA: Diagnosis not present

## 2019-08-14 DIAGNOSIS — G629 Polyneuropathy, unspecified: Secondary | ICD-10-CM | POA: Diagnosis not present

## 2019-08-26 DIAGNOSIS — Z79899 Other long term (current) drug therapy: Secondary | ICD-10-CM | POA: Diagnosis not present

## 2019-08-26 DIAGNOSIS — Z48288 Encounter for aftercare following multiple organ transplant: Secondary | ICD-10-CM | POA: Diagnosis not present

## 2019-08-26 DIAGNOSIS — Z5181 Encounter for therapeutic drug level monitoring: Secondary | ICD-10-CM | POA: Diagnosis not present

## 2019-08-26 MED FILL — MYCOPHENOLIC ACID DR 180 MG: 180 | 30 days supply | Qty: 60 | Fill #2

## 2019-08-26 MED FILL — AMLODIPINE BESYLATE 10 MG T: 10 | 90 days supply | Qty: 90 | Fill #0

## 2019-09-10 MED FILL — predniSONE 5 MG TABS: 5 | 30 days supply | Qty: 30 | Fill #3

## 2019-09-10 MED FILL — SULFAMETHOXAZOLE-TMP SS TAB: 400-80 | 28 days supply | Qty: 12 | Fill #2

## 2019-09-11 DIAGNOSIS — Z9483 Pancreas transplant status: Secondary | ICD-10-CM | POA: Diagnosis not present

## 2019-09-11 DIAGNOSIS — I10 Essential (primary) hypertension: Secondary | ICD-10-CM | POA: Diagnosis not present

## 2019-09-11 DIAGNOSIS — G629 Polyneuropathy, unspecified: Secondary | ICD-10-CM | POA: Diagnosis not present

## 2019-09-11 DIAGNOSIS — Z940 Kidney transplant status: Secondary | ICD-10-CM | POA: Diagnosis not present

## 2019-09-11 DIAGNOSIS — E108 Type 1 diabetes mellitus with unspecified complications: Secondary | ICD-10-CM | POA: Diagnosis not present

## 2019-09-17 DIAGNOSIS — D8989 Other specified disorders involving the immune mechanism, not elsewhere classified: Secondary | ICD-10-CM | POA: Diagnosis not present

## 2019-09-17 DIAGNOSIS — E109 Type 1 diabetes mellitus without complications: Secondary | ICD-10-CM | POA: Diagnosis not present

## 2019-09-17 DIAGNOSIS — B9789 Other viral agents as the cause of diseases classified elsewhere: Secondary | ICD-10-CM | POA: Diagnosis not present

## 2019-09-17 DIAGNOSIS — Z9483 Pancreas transplant status: Secondary | ICD-10-CM | POA: Diagnosis not present

## 2019-09-17 DIAGNOSIS — Z940 Kidney transplant status: Secondary | ICD-10-CM | POA: Diagnosis not present

## 2019-09-23 DIAGNOSIS — Z5181 Encounter for therapeutic drug level monitoring: Secondary | ICD-10-CM | POA: Diagnosis not present

## 2019-09-23 DIAGNOSIS — Z79899 Other long term (current) drug therapy: Secondary | ICD-10-CM | POA: Diagnosis not present

## 2019-09-23 DIAGNOSIS — Z48288 Encounter for aftercare following multiple organ transplant: Secondary | ICD-10-CM | POA: Diagnosis not present

## 2019-09-23 DIAGNOSIS — Z940 Kidney transplant status: Secondary | ICD-10-CM | POA: Diagnosis not present

## 2019-09-23 DIAGNOSIS — Z9483 Pancreas transplant status: Secondary | ICD-10-CM | POA: Diagnosis not present

## 2019-09-23 MED FILL — MYCOPHENOLIC ACID DR 180 MG: 180 | 30 days supply | Qty: 60 | Fill #3

## 2019-09-23 MED FILL — TACROLIMUS 1 MG CAPS: 1 | 30 days supply | Qty: 240 | Fill #1

## 2019-09-25 DIAGNOSIS — Z48288 Encounter for aftercare following multiple organ transplant: Secondary | ICD-10-CM | POA: Diagnosis not present

## 2019-09-30 MED FILL — CARVEDILOL 12.5 MG TABLET: 12.5 | 60 days supply | Qty: 120 | Fill #3

## 2019-10-02 MED FILL — predniSONE 5 MG TABS: 5 | 30 days supply | Qty: 60 | Fill #0

## 2019-10-02 MED FILL — SULFAMETHOXAZOLE-TMP SS TAB: 400-80 | 28 days supply | Qty: 12 | Fill #3

## 2019-10-10 DIAGNOSIS — Z9483 Pancreas transplant status: Secondary | ICD-10-CM | POA: Diagnosis not present

## 2019-10-10 DIAGNOSIS — Z940 Kidney transplant status: Secondary | ICD-10-CM | POA: Diagnosis not present

## 2019-10-10 DIAGNOSIS — Z48288 Encounter for aftercare following multiple organ transplant: Secondary | ICD-10-CM | POA: Diagnosis not present

## 2019-10-15 DIAGNOSIS — M160 Bilateral primary osteoarthritis of hip: Secondary | ICD-10-CM | POA: Diagnosis not present

## 2019-10-15 DIAGNOSIS — E109 Type 1 diabetes mellitus without complications: Secondary | ICD-10-CM | POA: Diagnosis not present

## 2019-10-15 DIAGNOSIS — M25552 Pain in left hip: Secondary | ICD-10-CM | POA: Diagnosis not present

## 2019-10-15 DIAGNOSIS — M5136 Other intervertebral disc degeneration, lumbar region: Secondary | ICD-10-CM | POA: Diagnosis not present

## 2019-10-15 MED FILL — DICLOFENAC SODIUM 1 % GEL: 1 | 25 days supply | Qty: 300 | Fill #0

## 2019-10-16 ENCOUNTER — Other Ambulatory Visit: Payer: Self-pay

## 2019-10-16 ENCOUNTER — Ambulatory Visit (INDEPENDENT_AMBULATORY_CARE_PROVIDER_SITE_OTHER): Payer: 59 | Admitting: Orthopedic Surgery

## 2019-10-16 DIAGNOSIS — M9979 Connective tissue and disc stenosis of intervertebral foramina of abdomen and other regions: Secondary | ICD-10-CM | POA: Diagnosis not present

## 2019-10-16 DIAGNOSIS — M519 Unspecified thoracic, thoracolumbar and lumbosacral intervertebral disc disorder: Secondary | ICD-10-CM

## 2019-10-16 DIAGNOSIS — M5410 Radiculopathy, site unspecified: Secondary | ICD-10-CM | POA: Diagnosis not present

## 2019-10-16 DIAGNOSIS — I779 Disorder of arteries and arterioles, unspecified: Secondary | ICD-10-CM

## 2019-10-17 MED FILL — MYCOPHENOLIC ACID DR 180 MG: 180 | 30 days supply | Qty: 60 | Fill #4

## 2019-10-18 ENCOUNTER — Encounter: Payer: Self-pay | Admitting: Orthopedic Surgery

## 2019-10-18 NOTE — Progress Notes (Signed)
Office Visit Note   Patient: Gregory Allen           Date of Birth: 16-Mar-1971           MRN: [id number] Visit Date: 10/16/2019 Requested by: Velna Hatchet, MD [address] [place],  [place] [address] PCP: Velna Hatchet, MD  Subjective: Chief Complaint  Patient presents with  . Left Leg - Pain    HPI: Gregory Allen is a 48 y.o. male who presents to the office complaining of left-sided radicular leg pain.  Patient notes that he has been having worsening radicular pain over the past 2 weeks.  He notes mostly buttocks pain that radiates down the posterior leg to the posterior anterior knee.  This pain wakes him up at night and seems to be worsening over the past couple weeks.  He notes occasional groin pain as well.  Denies any numbness or tingling.  Denies any bowel or bladder dysfunction or saddle anesthesia.  Denies any weakness.  The symptoms feel exactly the same to previous symptoms that he had on his right side.  He has had a previous MRI as well as previous ESI's by Dr. Ernestina Patches on 03/20/2019 where he performed S1 lumbosacral transforaminal ESI's.  He states that these provided him excellent relief and would like a referral back to Dr. Ernestina Patches for left-sided injections.  Patient is disabled due to history of kidney transplant.                ROS:  All systems reviewed are negative as they relate to the chief complaint within the history of present illness.  Patient denies fevers or chills.  Assessment & Plan: Visit Diagnoses:  1. Radiculopathy of leg   2. 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  These feel exactly the same to previous symptoms he had on his right side for which she received ESI's with great relief.  He is not having any weakness or loss of sensation currently.  No red flag symptoms.  He had an MRI scan of the lumbar spine on 01/04/2019 that revealed  disc bulge at L4-L5 with mild bilateral foraminal stenosis as well as central disc herniation at L5-S1 without neurocompression.  Referred patient to Dr. Laurence Spates for left-sided lumbar epidural steroid injections.  No weakness on exam today.  No indications for anything other than conservative management at this time in addition to the injections patient agreed with plan will follow up with the office as needed.  Follow-Up Instructions: No follow-ups on file.   Orders:  Orders Placed This Encounter  Procedures  . Ambulatory referral to Physical Medicine Rehab   No orders of the defined types were placed in this encounter.     Procedures: No procedures performed   Clinical Data: No additional findings.  Objective: Vital Signs: There were no vitals taken for this visit.  Physical Exam:  Constitutional: Patient appears well-developed HEENT:  Head: Normocephalic Eyes:EOM are normal Neck: Normal range of motion Cardiovascular: Normal rate Pulmonary/chest: Effort normal Neurologic: Patient is alert Skin: Skin is warm Psychiatric: Patient has normal mood and affect  Ortho Exam:  Tenderness palpation throughout the axial lumbar spine.  Tenderness palpation throughout the left paraspinal musculature.  No tenderness palpation throughout the right paraspinal musculature.  No evidence of hypo or hyperreflexia.  Sensation intact through all dermatomes of the bilateral lower extremities.  5/5 motor strength of  the hip flexors, quadriceps, hamstrings, dorsiflexion, plantarflexion.  Positive straight leg raise on left.  Negative straight leg raise on right.  Specialty Comments:  No specialty comments available.  Imaging: No results found.   PMFS History: Patient Active Problem List   Diagnosis Date Noted  . BP (high blood pressure) 11/19/2015  . History of organ or tissue transplant 05/12/2015  . Essential (primary) hypertension 05/12/2015  . Combined fat and carbohydrate  induced hyperlipemia 05/12/2015  . Type 1 diabetes mellitus with hyperglycemia ([place]) 05/12/2015  . Arthritis of hand, degenerative 09/08/2014  . Hypophosphatemia 04/24/2012  . Carpal bone fracture 01/17/2012  . Tarsal tunnel syndrome 01/17/2012  . Nonproliferative diabetic retinopathy ([place]) 02/08/2011  . Type 1 diabetes mellitus ([place]) 02/08/2011  . Diabetic polyneuropathy ([place]) 02/08/2011  . Abnormal presence of protein in urine 02/08/2011   Past Medical History:  Diagnosis Date  . Diabetes mellitus without complication ([place])     No family history on file.  Past Surgical History:  Procedure Laterality Date  . KIDNEY TRANSPLANT    . TRANSPLANT PANCREATIC ALLOGRAFT     Social History   Occupational History  . Not on file  Tobacco Use  . Smoking status: Never Smoker  . Smokeless tobacco: Never Used  Substance and Sexual Activity  . Alcohol use: No    Alcohol/week: 0.0 standard drinks  . Drug use: No  . Sexual activity: Yes

## 2019-10-28 DIAGNOSIS — Z48288 Encounter for aftercare following multiple organ transplant: Secondary | ICD-10-CM | POA: Diagnosis not present

## 2019-10-28 DIAGNOSIS — E109 Type 1 diabetes mellitus without complications: Secondary | ICD-10-CM | POA: Diagnosis not present

## 2019-10-28 DIAGNOSIS — R61 Generalized hyperhidrosis: Secondary | ICD-10-CM | POA: Diagnosis not present

## 2019-10-28 DIAGNOSIS — Z79899 Other long term (current) drug therapy: Secondary | ICD-10-CM | POA: Diagnosis not present

## 2019-10-28 DIAGNOSIS — I10 Essential (primary) hypertension: Secondary | ICD-10-CM | POA: Diagnosis not present

## 2019-10-28 DIAGNOSIS — Z940 Kidney transplant status: Secondary | ICD-10-CM | POA: Diagnosis not present

## 2019-10-28 DIAGNOSIS — B348 Other viral infections of unspecified site: Secondary | ICD-10-CM | POA: Diagnosis not present

## 2019-10-28 DIAGNOSIS — K5900 Constipation, unspecified: Secondary | ICD-10-CM | POA: Diagnosis not present

## 2019-10-28 DIAGNOSIS — D649 Anemia, unspecified: Secondary | ICD-10-CM | POA: Diagnosis not present

## 2019-10-28 DIAGNOSIS — D8489 Other immunodeficiencies: Secondary | ICD-10-CM | POA: Diagnosis not present

## 2019-10-28 DIAGNOSIS — I151 Hypertension secondary to other renal disorders: Secondary | ICD-10-CM | POA: Diagnosis not present

## 2019-10-28 DIAGNOSIS — Z9483 Pancreas transplant status: Secondary | ICD-10-CM | POA: Diagnosis not present

## 2019-10-28 DIAGNOSIS — D849 Immunodeficiency, unspecified: Secondary | ICD-10-CM | POA: Diagnosis not present

## 2019-10-28 DIAGNOSIS — B349 Viral infection, unspecified: Secondary | ICD-10-CM | POA: Diagnosis not present

## 2019-10-28 DIAGNOSIS — Z792 Long term (current) use of antibiotics: Secondary | ICD-10-CM | POA: Diagnosis not present

## 2019-10-28 DIAGNOSIS — N2889 Other specified disorders of kidney and ureter: Secondary | ICD-10-CM | POA: Diagnosis not present

## 2019-10-28 MED FILL — TACROLIMUS 1 MG CAPSULE: 1 | 30 days supply | Qty: 270 | Fill #0

## 2019-10-28 MED FILL — predniSONE 5 MG TABS: 5 | 30 days supply | Qty: 60 | Fill #1

## 2019-10-28 MED FILL — SULFAMETHOXAZOLE-TMP SS TAB: 400-80 | 28 days supply | Qty: 12 | Fill #4

## 2019-11-06 DIAGNOSIS — Z79899 Other long term (current) drug therapy: Secondary | ICD-10-CM | POA: Diagnosis not present

## 2019-11-06 DIAGNOSIS — Z9483 Pancreas transplant status: Secondary | ICD-10-CM | POA: Diagnosis not present

## 2019-11-06 DIAGNOSIS — Z940 Kidney transplant status: Secondary | ICD-10-CM | POA: Diagnosis not present

## 2019-11-06 DIAGNOSIS — Z48288 Encounter for aftercare following multiple organ transplant: Secondary | ICD-10-CM | POA: Diagnosis not present

## 2019-11-07 ENCOUNTER — Encounter: Payer: Self-pay | Admitting: Physical Medicine and Rehabilitation

## 2019-11-07 ENCOUNTER — Other Ambulatory Visit: Payer: Self-pay

## 2019-11-07 ENCOUNTER — Ambulatory Visit (INDEPENDENT_AMBULATORY_CARE_PROVIDER_SITE_OTHER): Payer: 59 | Admitting: Physical Medicine and Rehabilitation

## 2019-11-07 ENCOUNTER — Ambulatory Visit: Payer: Self-pay

## 2019-11-07 VITALS — BP 129/83 | HR 75

## 2019-11-07 DIAGNOSIS — M5416 Radiculopathy, lumbar region: Secondary | ICD-10-CM

## 2019-11-07 DIAGNOSIS — M961 Postlaminectomy syndrome, not elsewhere classified: Secondary | ICD-10-CM | POA: Diagnosis not present

## 2019-11-07 DIAGNOSIS — M5116 Intervertebral disc disorders with radiculopathy, lumbar region: Secondary | ICD-10-CM

## 2019-11-07 MED ORDER — METHYLPREDNISOLONE ACETATE 80 MG/ML IJ SUSP
40.0000 mg | Freq: Once | INTRAMUSCULAR | Status: AC
  Administered 2019-11-07: 14:00:00 40 mg

## 2019-11-07 NOTE — Progress Notes (Signed)
Pt states pain in the lower back on the left side that radiates into the left buttocks. Pt also states pain in the back of the left thigh. Pt states pain started 2 months ago and has gotten worse in the past few weeks. Sitting up makes pain worse. Laying down helps with pain.   .Numeric Pain Rating Scale and Functional Assessment Average Pain 8   In the last MONTH (on 0-10 scale) has pain interfered with the following?  1. General activity like being  able to carry out your everyday physical activities such as walking, climbing stairs, carrying groceries, or moving a chair?  Rating(8)   +Driver, -BT, -Dye Allergies.

## 2019-11-11 DIAGNOSIS — B338 Other specified viral diseases: Secondary | ICD-10-CM | POA: Diagnosis not present

## 2019-11-11 DIAGNOSIS — R61 Generalized hyperhidrosis: Secondary | ICD-10-CM | POA: Diagnosis not present

## 2019-11-11 DIAGNOSIS — I2510 Atherosclerotic heart disease of native coronary artery without angina pectoris: Secondary | ICD-10-CM | POA: Diagnosis not present

## 2019-11-11 DIAGNOSIS — N281 Cyst of kidney, acquired: Secondary | ICD-10-CM | POA: Diagnosis not present

## 2019-11-11 DIAGNOSIS — N184 Chronic kidney disease, stage 4 (severe): Secondary | ICD-10-CM | POA: Diagnosis not present

## 2019-11-11 DIAGNOSIS — E1022 Type 1 diabetes mellitus with diabetic chronic kidney disease: Secondary | ICD-10-CM | POA: Diagnosis not present

## 2019-11-11 DIAGNOSIS — I700 Atherosclerosis of aorta: Secondary | ICD-10-CM | POA: Diagnosis not present

## 2019-11-13 MED FILL — MYCOPHENOLATE SODIUM 180 MG: 180 | 30 days supply | Qty: 60 | Fill #0

## 2019-11-19 MED FILL — MYCOPHENOLATE SODIUM 180 MG: 180 | 30 days supply | Qty: 60 | Fill #0

## 2019-11-20 DIAGNOSIS — Z940 Kidney transplant status: Secondary | ICD-10-CM | POA: Diagnosis not present

## 2019-11-20 DIAGNOSIS — Z79899 Other long term (current) drug therapy: Secondary | ICD-10-CM | POA: Diagnosis not present

## 2019-11-20 DIAGNOSIS — N2889 Other specified disorders of kidney and ureter: Secondary | ICD-10-CM | POA: Diagnosis not present

## 2019-11-20 DIAGNOSIS — K219 Gastro-esophageal reflux disease without esophagitis: Secondary | ICD-10-CM | POA: Diagnosis not present

## 2019-11-20 DIAGNOSIS — Z9483 Pancreas transplant status: Secondary | ICD-10-CM | POA: Diagnosis not present

## 2019-11-20 DIAGNOSIS — E785 Hyperlipidemia, unspecified: Secondary | ICD-10-CM | POA: Diagnosis not present

## 2019-11-20 DIAGNOSIS — E109 Type 1 diabetes mellitus without complications: Secondary | ICD-10-CM | POA: Diagnosis not present

## 2019-11-20 DIAGNOSIS — K5900 Constipation, unspecified: Secondary | ICD-10-CM | POA: Diagnosis not present

## 2019-11-20 DIAGNOSIS — Z48288 Encounter for aftercare following multiple organ transplant: Secondary | ICD-10-CM | POA: Diagnosis not present

## 2019-11-20 DIAGNOSIS — B348 Other viral infections of unspecified site: Secondary | ICD-10-CM | POA: Diagnosis not present

## 2019-11-20 DIAGNOSIS — I10 Essential (primary) hypertension: Secondary | ICD-10-CM | POA: Diagnosis not present

## 2019-11-20 DIAGNOSIS — Z7952 Long term (current) use of systemic steroids: Secondary | ICD-10-CM | POA: Diagnosis not present

## 2019-11-20 DIAGNOSIS — Z5181 Encounter for therapeutic drug level monitoring: Secondary | ICD-10-CM | POA: Diagnosis not present

## 2019-11-20 DIAGNOSIS — E869 Volume depletion, unspecified: Secondary | ICD-10-CM | POA: Diagnosis not present

## 2019-11-20 DIAGNOSIS — B349 Viral infection, unspecified: Secondary | ICD-10-CM | POA: Diagnosis not present

## 2019-11-20 DIAGNOSIS — D649 Anemia, unspecified: Secondary | ICD-10-CM | POA: Diagnosis not present

## 2019-11-20 DIAGNOSIS — D849 Immunodeficiency, unspecified: Secondary | ICD-10-CM | POA: Diagnosis not present

## 2019-11-20 DIAGNOSIS — I151 Hypertension secondary to other renal disorders: Secondary | ICD-10-CM | POA: Diagnosis not present

## 2019-11-20 DIAGNOSIS — G629 Polyneuropathy, unspecified: Secondary | ICD-10-CM | POA: Diagnosis not present

## 2019-11-20 DIAGNOSIS — R61 Generalized hyperhidrosis: Secondary | ICD-10-CM | POA: Diagnosis not present

## 2019-11-22 MED FILL — SULFAMETHOXAZOLE-TMP SS TAB: 400-80 | 28 days supply | Qty: 12 | Fill #5

## 2019-11-22 MED FILL — AMLODIPINE BESYLATE 10 MG T: 10 | 90 days supply | Qty: 90 | Fill #1

## 2019-11-26 MED FILL — AMLODIPINE BESYLATE 10 MG T: 10 | 90 days supply | Qty: 90 | Fill #0

## 2019-11-26 MED FILL — SULFAMETHOXAZOLE-TMP SS TAB: 400-80 | 28 days supply | Qty: 12 | Fill #0

## 2019-11-27 MED FILL — TACROLIMUS 1 MG CAPSULE: 1 | 30 days supply | Qty: 240 | Fill #0

## 2019-12-02 MED FILL — predniSONE 5 MG TABS: 5 | 30 days supply | Qty: 60 | Fill #0

## 2019-12-04 DIAGNOSIS — I151 Hypertension secondary to other renal disorders: Secondary | ICD-10-CM | POA: Diagnosis not present

## 2019-12-04 DIAGNOSIS — Z48288 Encounter for aftercare following multiple organ transplant: Secondary | ICD-10-CM | POA: Diagnosis not present

## 2019-12-04 DIAGNOSIS — Z5181 Encounter for therapeutic drug level monitoring: Secondary | ICD-10-CM | POA: Diagnosis not present

## 2019-12-04 DIAGNOSIS — D649 Anemia, unspecified: Secondary | ICD-10-CM | POA: Diagnosis not present

## 2019-12-04 DIAGNOSIS — Z940 Kidney transplant status: Secondary | ICD-10-CM | POA: Diagnosis not present

## 2019-12-04 DIAGNOSIS — K5900 Constipation, unspecified: Secondary | ICD-10-CM | POA: Diagnosis not present

## 2019-12-04 DIAGNOSIS — R0981 Nasal congestion: Secondary | ICD-10-CM | POA: Diagnosis not present

## 2019-12-04 DIAGNOSIS — R61 Generalized hyperhidrosis: Secondary | ICD-10-CM | POA: Diagnosis not present

## 2019-12-04 DIAGNOSIS — B348 Other viral infections of unspecified site: Secondary | ICD-10-CM | POA: Diagnosis not present

## 2019-12-04 DIAGNOSIS — E109 Type 1 diabetes mellitus without complications: Secondary | ICD-10-CM | POA: Diagnosis not present

## 2019-12-04 DIAGNOSIS — Z9483 Pancreas transplant status: Secondary | ICD-10-CM | POA: Diagnosis not present

## 2019-12-04 DIAGNOSIS — D849 Immunodeficiency, unspecified: Secondary | ICD-10-CM | POA: Diagnosis not present

## 2019-12-04 DIAGNOSIS — B349 Viral infection, unspecified: Secondary | ICD-10-CM | POA: Diagnosis not present

## 2019-12-04 DIAGNOSIS — I10 Essential (primary) hypertension: Secondary | ICD-10-CM | POA: Diagnosis not present

## 2019-12-04 DIAGNOSIS — Z79899 Other long term (current) drug therapy: Secondary | ICD-10-CM | POA: Diagnosis not present

## 2019-12-04 DIAGNOSIS — N2889 Other specified disorders of kidney and ureter: Secondary | ICD-10-CM | POA: Diagnosis not present

## 2019-12-09 DIAGNOSIS — B348 Other viral infections of unspecified site: Secondary | ICD-10-CM | POA: Diagnosis not present

## 2019-12-09 DIAGNOSIS — Z940 Kidney transplant status: Secondary | ICD-10-CM | POA: Diagnosis not present

## 2019-12-09 DIAGNOSIS — Z48288 Encounter for aftercare following multiple organ transplant: Secondary | ICD-10-CM | POA: Diagnosis not present

## 2019-12-09 DIAGNOSIS — Z9483 Pancreas transplant status: Secondary | ICD-10-CM | POA: Diagnosis not present

## 2019-12-09 DIAGNOSIS — Z5181 Encounter for therapeutic drug level monitoring: Secondary | ICD-10-CM | POA: Diagnosis not present

## 2019-12-09 DIAGNOSIS — Z79899 Other long term (current) drug therapy: Secondary | ICD-10-CM | POA: Diagnosis not present

## 2019-12-16 MED FILL — CARVEDILOL 12.5 MG TABLET: 12.5 | 60 days supply | Qty: 120 | Fill #0

## 2019-12-17 MED FILL — MYCOPHENOLATE SODIUM 180 MG: 180 | 30 days supply | Qty: 120 | Fill #0

## 2019-12-18 DIAGNOSIS — Z7952 Long term (current) use of systemic steroids: Secondary | ICD-10-CM | POA: Diagnosis not present

## 2019-12-18 DIAGNOSIS — N2889 Other specified disorders of kidney and ureter: Secondary | ICD-10-CM | POA: Diagnosis not present

## 2019-12-18 DIAGNOSIS — Z5181 Encounter for therapeutic drug level monitoring: Secondary | ICD-10-CM | POA: Diagnosis not present

## 2019-12-18 DIAGNOSIS — I10 Essential (primary) hypertension: Secondary | ICD-10-CM | POA: Diagnosis not present

## 2019-12-18 DIAGNOSIS — Z48288 Encounter for aftercare following multiple organ transplant: Secondary | ICD-10-CM | POA: Diagnosis not present

## 2019-12-18 DIAGNOSIS — Z9483 Pancreas transplant status: Secondary | ICD-10-CM | POA: Diagnosis not present

## 2019-12-18 DIAGNOSIS — B348 Other viral infections of unspecified site: Secondary | ICD-10-CM | POA: Diagnosis not present

## 2019-12-18 DIAGNOSIS — Z7982 Long term (current) use of aspirin: Secondary | ICD-10-CM | POA: Diagnosis not present

## 2019-12-18 DIAGNOSIS — D849 Immunodeficiency, unspecified: Secondary | ICD-10-CM | POA: Diagnosis not present

## 2019-12-18 DIAGNOSIS — Z940 Kidney transplant status: Secondary | ICD-10-CM | POA: Diagnosis not present

## 2019-12-18 DIAGNOSIS — I151 Hypertension secondary to other renal disorders: Secondary | ICD-10-CM | POA: Diagnosis not present

## 2019-12-18 DIAGNOSIS — Z7901 Long term (current) use of anticoagulants: Secondary | ICD-10-CM | POA: Diagnosis not present

## 2019-12-18 DIAGNOSIS — Z79899 Other long term (current) drug therapy: Secondary | ICD-10-CM | POA: Diagnosis not present

## 2019-12-18 DIAGNOSIS — Z792 Long term (current) use of antibiotics: Secondary | ICD-10-CM | POA: Diagnosis not present

## 2019-12-26 DIAGNOSIS — Z760 Encounter for issue of repeat prescription: Secondary | ICD-10-CM | POA: Diagnosis not present

## 2019-12-30 MED FILL — SULFAMETHOXAZOLE-TMP SS TAB: 400-80 | 28 days supply | Qty: 12 | Fill #0

## 2019-12-30 MED FILL — predniSONE 5 MG TABS: 5 | 30 days supply | Qty: 60 | Fill #1

## 2020-01-01 DIAGNOSIS — Z4822 Encounter for aftercare following kidney transplant: Secondary | ICD-10-CM | POA: Diagnosis not present

## 2020-01-01 DIAGNOSIS — B338 Other specified viral diseases: Secondary | ICD-10-CM | POA: Diagnosis not present

## 2020-01-10 MED FILL — TACROLIMUS 1 MG CAPSULE: 1 | 30 days supply | Qty: 240 | Fill #1

## 2020-01-11 ENCOUNTER — Ambulatory Visit: Payer: 59 | Attending: Internal Medicine

## 2020-01-11 DIAGNOSIS — Z23 Encounter for immunization: Secondary | ICD-10-CM | POA: Insufficient documentation

## 2020-01-11 NOTE — Progress Notes (Signed)
   Covid-19 Vaccination Clinic  Name:  Gregory Allen    MRN: [id number] DOB: June 16, 1971  01/11/2020  Mr. Fecher was observed post Covid-19 immunization for 15 minutes without incident. He was provided with Vaccine Information Sheet and instruction to access the V-Safe system.   Mr. Cregger was instructed to call 911 with any severe reactions post vaccine: Marland Kitchen Difficulty breathing  . Swelling of face and throat  . A fast heartbeat  . A bad rash all over body  . Dizziness and weakness   Immunizations Administered    Name Date Dose VIS Date Route   Pfizer COVID-19 Vaccine 01/11/2020  5:59 PM 0.3 mL 10/18/2019 Intramuscular   Manufacturer: [place]   Lot: [id number]   [place]: [id number]

## 2020-01-15 DIAGNOSIS — Z79899 Other long term (current) drug therapy: Secondary | ICD-10-CM | POA: Diagnosis not present

## 2020-01-15 DIAGNOSIS — Z4822 Encounter for aftercare following kidney transplant: Secondary | ICD-10-CM | POA: Diagnosis not present

## 2020-01-21 MED FILL — SULFAMETHOXAZOLE-TMP SS TAB: 400-80 | 28 days supply | Qty: 12 | Fill #0

## 2020-01-29 DIAGNOSIS — Z7901 Long term (current) use of anticoagulants: Secondary | ICD-10-CM | POA: Diagnosis not present

## 2020-01-29 DIAGNOSIS — I10 Essential (primary) hypertension: Secondary | ICD-10-CM | POA: Diagnosis not present

## 2020-01-29 DIAGNOSIS — Z48288 Encounter for aftercare following multiple organ transplant: Secondary | ICD-10-CM | POA: Diagnosis not present

## 2020-01-29 DIAGNOSIS — Z940 Kidney transplant status: Secondary | ICD-10-CM | POA: Diagnosis not present

## 2020-01-29 DIAGNOSIS — Z792 Long term (current) use of antibiotics: Secondary | ICD-10-CM | POA: Diagnosis not present

## 2020-01-29 DIAGNOSIS — Z79899 Other long term (current) drug therapy: Secondary | ICD-10-CM | POA: Diagnosis not present

## 2020-01-29 DIAGNOSIS — B348 Other viral infections of unspecified site: Secondary | ICD-10-CM | POA: Diagnosis not present

## 2020-01-29 DIAGNOSIS — Z7952 Long term (current) use of systemic steroids: Secondary | ICD-10-CM | POA: Diagnosis not present

## 2020-01-29 DIAGNOSIS — Z9483 Pancreas transplant status: Secondary | ICD-10-CM | POA: Diagnosis not present

## 2020-01-30 MED FILL — MYCOPHENOLATE SODIUM 180 MG: 180 | 30 days supply | Qty: 120 | Fill #1

## 2020-01-30 MED FILL — predniSONE 5 MG TABS: 5 | 30 days supply | Qty: 60 | Fill #2

## 2020-02-11 ENCOUNTER — Ambulatory Visit: Payer: 59 | Attending: Internal Medicine

## 2020-02-11 DIAGNOSIS — Z23 Encounter for immunization: Secondary | ICD-10-CM

## 2020-02-11 NOTE — Progress Notes (Signed)
   Covid-19 Vaccination Clinic  Name:  Gregory Allen    MRN: [id number] DOB: [birth year]  02/11/2020  Mr. [name] was observed post Covid-19 immunization for 15 minutes without incident. He was provided with Vaccine Information Sheet and instruction to access the V-Safe system.   Mr. [name] was instructed to call 911 with any severe reactions post vaccine: Marland Kitchen Difficulty breathing  . Swelling of face and throat  . A fast heartbeat  . A bad rash all over body  . Dizziness and weakness   Immunizations Administered    Name Date Dose VIS Date Route   Pfizer COVID-19 Vaccine 02/11/2020  9:21 AM 0.3 mL 10/18/2019 Intramuscular   Manufacturer: [employer]   Lot: [id number]   [place]: [id number]

## 2020-02-12 DIAGNOSIS — Z5181 Encounter for therapeutic drug level monitoring: Secondary | ICD-10-CM | POA: Diagnosis not present

## 2020-02-12 DIAGNOSIS — Z940 Kidney transplant status: Secondary | ICD-10-CM | POA: Diagnosis not present

## 2020-02-12 DIAGNOSIS — Z48288 Encounter for aftercare following multiple organ transplant: Secondary | ICD-10-CM | POA: Diagnosis not present

## 2020-02-12 DIAGNOSIS — Z9483 Pancreas transplant status: Secondary | ICD-10-CM | POA: Diagnosis not present

## 2020-02-12 DIAGNOSIS — Z79899 Other long term (current) drug therapy: Secondary | ICD-10-CM | POA: Diagnosis not present

## 2020-02-13 MED FILL — AMLODIPINE BESYLATE 10 MG T: 10 | 90 days supply | Qty: 90 | Fill #0

## 2020-02-13 MED FILL — SULFAMETHOXAZOLE-TMP SS TAB: 400-80 | 28 days supply | Qty: 12 | Fill #1

## 2020-02-13 MED FILL — TACROLIMUS 1 MG CAPSULE: 1 | 30 days supply | Qty: 240 | Fill #2

## 2020-02-13 MED FILL — CARVEDILOL 12.5 MG TABLET: 12.5 | 60 days supply | Qty: 120 | Fill #0

## 2020-02-26 ENCOUNTER — Other Ambulatory Visit: Payer: Self-pay | Admitting: *Deleted

## 2020-02-26 DIAGNOSIS — R252 Cramp and spasm: Secondary | ICD-10-CM | POA: Diagnosis not present

## 2020-02-26 DIAGNOSIS — B348 Other viral infections of unspecified site: Secondary | ICD-10-CM | POA: Diagnosis not present

## 2020-02-26 DIAGNOSIS — D849 Immunodeficiency, unspecified: Secondary | ICD-10-CM | POA: Diagnosis not present

## 2020-02-26 DIAGNOSIS — Z7952 Long term (current) use of systemic steroids: Secondary | ICD-10-CM | POA: Diagnosis not present

## 2020-02-26 DIAGNOSIS — I779 Disorder of arteries and arterioles, unspecified: Secondary | ICD-10-CM

## 2020-02-26 DIAGNOSIS — I10 Essential (primary) hypertension: Secondary | ICD-10-CM | POA: Diagnosis not present

## 2020-02-26 DIAGNOSIS — Z48288 Encounter for aftercare following multiple organ transplant: Secondary | ICD-10-CM | POA: Diagnosis not present

## 2020-02-26 DIAGNOSIS — E089 Diabetes mellitus due to underlying condition without complications: Secondary | ICD-10-CM | POA: Diagnosis not present

## 2020-02-26 DIAGNOSIS — Z940 Kidney transplant status: Secondary | ICD-10-CM | POA: Diagnosis not present

## 2020-02-26 DIAGNOSIS — Z9483 Pancreas transplant status: Secondary | ICD-10-CM | POA: Diagnosis not present

## 2020-02-28 ENCOUNTER — Telehealth (HOSPITAL_COMMUNITY): Payer: Self-pay

## 2020-02-28 NOTE — Telephone Encounter (Signed)

## 2020-03-02 ENCOUNTER — Ambulatory Visit (INDEPENDENT_AMBULATORY_CARE_PROVIDER_SITE_OTHER): Payer: 59 | Admitting: Physician Assistant

## 2020-03-02 ENCOUNTER — Other Ambulatory Visit: Payer: Self-pay

## 2020-03-02 ENCOUNTER — Ambulatory Visit (HOSPITAL_COMMUNITY)
Admission: RE | Admit: 2020-03-02 | Discharge: 2020-03-02 | Disposition: A | Discharge status: Home / Self Care | Payer: 59 | Source: Ambulatory Visit | Attending: Vascular Surgery | Admitting: Vascular Surgery

## 2020-03-02 VITALS — BP 123/82 | HR 75 | Temp 97.7°F | Resp 18 | Ht 70.0 in | Wt 198.0 lb

## 2020-03-02 DIAGNOSIS — H5213 Myopia, bilateral: Secondary | ICD-10-CM | POA: Diagnosis not present

## 2020-03-02 DIAGNOSIS — I779 Disorder of arteries and arterioles, unspecified: Secondary | ICD-10-CM | POA: Diagnosis not present

## 2020-03-02 DIAGNOSIS — H524 Presbyopia: Secondary | ICD-10-CM | POA: Diagnosis not present

## 2020-03-02 DIAGNOSIS — H52223 Regular astigmatism, bilateral: Secondary | ICD-10-CM | POA: Diagnosis not present

## 2020-03-02 MED FILL — predniSONE 5 MG TABS: 5 | 30 days supply | Qty: 60 | Fill #3

## 2020-03-02 NOTE — Progress Notes (Signed)
Office Note     CC:  follow up Requesting Provider:  Velna Hatchet, MD  HPI: Gregory Allen is a 49 y.o. (20-Mar-1971) male who presents for yearly follow-up of mild lower extremity arterial disease.  He was evaluated 1 year ago complaining of burning pain in his lower extremities with activity.  His wife accompanies him in the exam room today.  He states his lower extremity pain has gotten significantly worse in the last several months.  Pain is brought on by movement.  He states he cannot ambulate at all without pain described as muscle cramps.  He indicates the left medial thigh is the most intense pain.  It awakens him from sleep.  Alleviating factors are stretching.  Medical history is significant for kidney and pancreas transplantation.  The pt is not on a statin for cholesterol management.  The pt is on a daily aspirin.   Other AC:  Pletal The pt is on CCB, BB for hypertension.   The pt is no longer diabetic.   Tobacco hx:  Negative  Past Medical History:  Diagnosis Date  . Diabetes mellitus without complication [facility])     Past Surgical History:  Procedure Laterality Date  . KIDNEY TRANSPLANT    . TRANSPLANT PANCREATIC ALLOGRAFT      Social History   Socioeconomic History  . Marital status: Married    Spouse name: Not on file  . Number of children: Not on file  . Years of education: Not on file  . Highest education level: Not on file  Occupational History  . Not on file  Tobacco Use  . Smoking status: Never Smoker  . Smokeless tobacco: Never Used  Substance and Sexual Activity  . Alcohol use: No    Alcohol/week: 0.0 standard drinks  . Drug use: No  . Sexual activity: Yes  Other Topics Concern  . Not on file  Social History Narrative  . Not on file   Social Determinants of Health   Financial Resource Strain:   . Difficulty of Paying Living Expenses:   Food Insecurity:   . Worried About Charity fundraiser in the Last Year:   . Arboriculturist in the  Last Year:   Transportation Needs:   . Film/video editor (Medical):   Marland Kitchen Lack of Transportation (Non-Medical):   Physical Activity:   . Days of Exercise per Week:   . Minutes of Exercise per Session:   Stress:   . Feeling of Stress :   Social Connections:   . Frequency of Communication with Friends and Family:   . Frequency of Social Gatherings with Friends and Family:   . Attends Religious Services:   . Active Member of Clubs or Organizations:   . Attends Archivist Meetings:   Marland Kitchen Marital Status:   Intimate Partner Violence:   . Fear of Current or Ex-Partner:   . Emotionally Abused:   Marland Kitchen Physically Abused:   . Sexually Abused:    No family history on file.  Current Outpatient Medications  Medication Sig Dispense Refill  . amLODipine (NORVASC) 10 MG tablet     . aspirin EC 81 MG tablet Take by mouth.    . carvedilol (COREG) 12.5 MG tablet Take 12.5 mg by mouth 2 (two) times daily with a meal.    . cilostazol (PLETAL) 100 MG tablet Take 1 tablet (100 mg total) by mouth 2 (two) times daily before a meal. 60 tablet 11  . mycophenolate (MYFORTIC) 180  MG EC tablet Take 1 tablet (180 mg total) by mouth 2 (two) times daily. 60 tablet 5  . sulfamethoxazole-trimethoprim (BACTRIM,SEPTRA) 400-80 MG tablet TAKE 1 TABLET BY MOUTH EVERY MONDAY, WENESDAY, AND FRIDAY  0  . tacrolimus (PROGRAF) 1 MG capsule Take by mouth.     No current facility-administered medications for this visit.    Allergies  Allergen Reactions  . Shellfish-Derived Products Itching    "throat starts itching" pt states "he is ok with iodine"  . Other Other (See Comments)    Shellfish per pt  . Shellfish Allergy      REVIEW OF SYSTEMS:   [X]  denotes positive finding, [ ]  denotes negative finding Cardiac  Comments:  Chest pain or chest pressure:    Shortness of breath upon exertion:    Short of breath when lying flat:    Irregular heart rhythm:        Vascular    Pain in calf, thigh, or hip  brought on by ambulation: x   Pain in feet at night that wakes you up from your sleep:     Blood clot in your veins:    Leg swelling:         Pulmonary    Oxygen at home:    Productive cough:     Wheezing:         Neurologic    Sudden weakness in arms or legs:     Sudden numbness in arms or legs:     Sudden onset of difficulty speaking or slurred speech:    Temporary loss of vision in one eye:     Problems with dizziness:         Gastrointestinal    Blood in stool:     Vomited blood:         Genitourinary    Burning when urinating:     Blood in urine:        Psychiatric    Major depression:         Hematologic    Bleeding problems:    Problems with blood clotting too easily:        Skin    Rashes or ulcers:        Constitutional    Fever or chills:      PHYSICAL EXAMINATION:  Vitals:   03/02/20 0936  Weight: 198 lb (89.8 kg)  Height: 5\' 10"  (1.778 m)   General:  WDWN in NAD; vital signs documented above Gait: No ataxia, walks unaided HENT: WNL, normocephalic Pulmonary: normal non-labored breathing , without Rales, rhonchi,  wheezing Cardiac: regular HR, without  Murmurs without carotid bruits Abdomen: soft, NT, no masses Skin: without rashes Extremities: without ischemic changes, without Gangrene , without cellulitis; without open wounds;  Musculoskeletal: no muscle wasting or atrophy  Neurologic: A&O X 3;  No focal weakness or paresthesias are detected Psychiatric:  The pt has Normal affect.  Pulse exam: The patient has 2+ bilateral brachial and radial pulses.  2+ femoral pulses bilaterally.  Popliteal pulses, and right pedal pulses are not palpable.  He has a 2+ dorsalis pedis pulse on the left.  Non-Invasive Vascular Imaging:   ABI/TBIToday's ABIToday's TBIPrevious ABIPrevious TBI  +-------+-----------+-----------+------------+------------+  Right 0.91    0.75    0.90    0.37        +-------+-----------+-----------+------------+------------+  Left  0.94    0.83    1.08    0.53      +-------+-----------+-----------+------------+------------+   Right lower  extremity: toe pressure = 130 mm Hg. DP waveforms are biphasic Left lower extremity: toe pressure= 145 mm Hg.  DP waveforms are triphasic  Right toe pressure April 2020 = 53 Left toe pressure April 2020 = 76  ASSESSMENT/PLAN:: 49 y.o. male here for follow up for mild lower extremity PAD.  I reviewed the history, physical exam findings and noninvasive vascular studies with Dr. Trula Slade today.  There is likely progression of microvascular disease, however we cannot attribute the lower extremity pain and muscle cramping the patient is experiencing to his vascular disease.  Discussed option of continuing Pletal.  The patient is interested in second opinion and I did offer a follow-up appointment with Dr. Donzetta Matters.  Recommend follow-up with noninvasive vascular studies in 1 year.    Barbie Banner, PA-C [facility] [phone]  Clinic MD:   Trula Slade

## 2020-03-04 MED FILL — AMOXICILLIN 500 MG CAPSULE: 500 | 7 days supply | Qty: 21 | Fill #0

## 2020-03-09 MED FILL — MYCOPHENOLATE SODIUM 180 MG: 180 | 30 days supply | Qty: 120 | Fill #2

## 2020-03-09 MED FILL — SULFAMETHOXAZOLE-TMP SS TAB: 400-80 | 28 days supply | Qty: 12 | Fill #2

## 2020-03-11 DIAGNOSIS — Z4822 Encounter for aftercare following kidney transplant: Secondary | ICD-10-CM | POA: Diagnosis not present

## 2020-03-11 DIAGNOSIS — Z79899 Other long term (current) drug therapy: Secondary | ICD-10-CM | POA: Diagnosis not present

## 2020-03-13 ENCOUNTER — Ambulatory Visit (HOSPITAL_COMMUNITY): Payer: Medicare Other

## 2020-03-13 ENCOUNTER — Ambulatory Visit: Payer: Medicare Other | Admitting: Vascular Surgery

## 2020-03-18 DIAGNOSIS — Z9483 Pancreas transplant status: Secondary | ICD-10-CM | POA: Diagnosis not present

## 2020-03-18 DIAGNOSIS — B348 Other viral infections of unspecified site: Secondary | ICD-10-CM | POA: Diagnosis not present

## 2020-03-18 DIAGNOSIS — G629 Polyneuropathy, unspecified: Secondary | ICD-10-CM | POA: Diagnosis not present

## 2020-03-18 DIAGNOSIS — I739 Peripheral vascular disease, unspecified: Secondary | ICD-10-CM | POA: Diagnosis not present

## 2020-03-18 DIAGNOSIS — I129 Hypertensive chronic kidney disease with stage 1 through stage 4 chronic kidney disease, or unspecified chronic kidney disease: Secondary | ICD-10-CM | POA: Diagnosis not present

## 2020-03-18 DIAGNOSIS — Z940 Kidney transplant status: Secondary | ICD-10-CM | POA: Diagnosis not present

## 2020-03-24 DIAGNOSIS — Z9483 Pancreas transplant status: Secondary | ICD-10-CM | POA: Diagnosis not present

## 2020-03-24 DIAGNOSIS — Z940 Kidney transplant status: Secondary | ICD-10-CM | POA: Diagnosis not present

## 2020-03-24 MED FILL — CEFDINIR 300 MG CAPSULE: 300 | 7 days supply | Qty: 14 | Fill #0

## 2020-03-26 ENCOUNTER — Other Ambulatory Visit (HOSPITAL_COMMUNITY): Payer: Self-pay | Admitting: Nephrology

## 2020-04-01 ENCOUNTER — Other Ambulatory Visit (HOSPITAL_COMMUNITY): Payer: Self-pay | Admitting: Nephrology

## 2020-04-01 MED FILL — CARVEDILOL 12.5 MG TABLET: 12.5 | 90 days supply | Qty: 180 | Fill #0

## 2020-04-01 MED FILL — predniSONE 5 MG TABS: 5 | 30 days supply | Qty: 60 | Fill #0

## 2020-04-01 MED FILL — SULFAMETHOXAZOLE-TMP SS TAB: 400-80 | 28 days supply | Qty: 12 | Fill #3

## 2020-04-03 MED FILL — MYCOPHENOLATE SODIUM 180 MG: 180 | 30 days supply | Qty: 120 | Fill #3

## 2020-04-12 MED FILL — TACROLIMUS 1 MG CAPSULE: 1 | 30 days supply | Qty: 240 | Fill #4

## 2020-04-13 DIAGNOSIS — Z0000 Encounter for general adult medical examination without abnormal findings: Secondary | ICD-10-CM | POA: Diagnosis not present

## 2020-04-13 DIAGNOSIS — I10 Essential (primary) hypertension: Secondary | ICD-10-CM | POA: Diagnosis not present

## 2020-04-13 DIAGNOSIS — E109 Type 1 diabetes mellitus without complications: Secondary | ICD-10-CM | POA: Diagnosis not present

## 2020-04-13 DIAGNOSIS — Z125 Encounter for screening for malignant neoplasm of prostate: Secondary | ICD-10-CM | POA: Diagnosis not present

## 2020-04-15 DIAGNOSIS — I739 Peripheral vascular disease, unspecified: Secondary | ICD-10-CM | POA: Diagnosis not present

## 2020-04-15 DIAGNOSIS — E1042 Type 1 diabetes mellitus with diabetic polyneuropathy: Secondary | ICD-10-CM | POA: Diagnosis not present

## 2020-04-15 DIAGNOSIS — Z940 Kidney transplant status: Secondary | ICD-10-CM | POA: Diagnosis not present

## 2020-04-15 DIAGNOSIS — R252 Cramp and spasm: Secondary | ICD-10-CM | POA: Diagnosis not present

## 2020-04-20 DIAGNOSIS — G629 Polyneuropathy, unspecified: Secondary | ICD-10-CM | POA: Diagnosis not present

## 2020-04-20 DIAGNOSIS — B348 Other viral infections of unspecified site: Secondary | ICD-10-CM | POA: Diagnosis not present

## 2020-04-20 DIAGNOSIS — Z7952 Long term (current) use of systemic steroids: Secondary | ICD-10-CM | POA: Diagnosis not present

## 2020-04-20 DIAGNOSIS — I10 Essential (primary) hypertension: Secondary | ICD-10-CM | POA: Diagnosis not present

## 2020-04-20 DIAGNOSIS — Z792 Long term (current) use of antibiotics: Secondary | ICD-10-CM | POA: Diagnosis not present

## 2020-04-20 DIAGNOSIS — Z79899 Other long term (current) drug therapy: Secondary | ICD-10-CM | POA: Diagnosis not present

## 2020-04-20 DIAGNOSIS — Z48288 Encounter for aftercare following multiple organ transplant: Secondary | ICD-10-CM | POA: Diagnosis not present

## 2020-04-20 DIAGNOSIS — I739 Peripheral vascular disease, unspecified: Secondary | ICD-10-CM | POA: Diagnosis not present

## 2020-04-20 DIAGNOSIS — D849 Immunodeficiency, unspecified: Secondary | ICD-10-CM | POA: Diagnosis not present

## 2020-04-20 DIAGNOSIS — Z9483 Pancreas transplant status: Secondary | ICD-10-CM | POA: Diagnosis not present

## 2020-04-20 DIAGNOSIS — R252 Cramp and spasm: Secondary | ICD-10-CM | POA: Diagnosis not present

## 2020-04-20 DIAGNOSIS — E119 Type 2 diabetes mellitus without complications: Secondary | ICD-10-CM | POA: Diagnosis not present

## 2020-04-20 DIAGNOSIS — Z940 Kidney transplant status: Secondary | ICD-10-CM | POA: Diagnosis not present

## 2020-04-20 MED FILL — GABAPENTIN 300 MG CAPSULE: 300 | 30 days supply | Qty: 60 | Fill #0

## 2020-04-21 DIAGNOSIS — Z1212 Encounter for screening for malignant neoplasm of rectum: Secondary | ICD-10-CM | POA: Diagnosis not present

## 2020-04-21 DIAGNOSIS — I10 Essential (primary) hypertension: Secondary | ICD-10-CM | POA: Diagnosis not present

## 2020-04-21 DIAGNOSIS — R82998 Other abnormal findings in urine: Secondary | ICD-10-CM | POA: Diagnosis not present

## 2020-04-22 ENCOUNTER — Other Ambulatory Visit: Payer: Self-pay | Admitting: Internal Medicine

## 2020-04-22 ENCOUNTER — Other Ambulatory Visit (HOSPITAL_COMMUNITY): Payer: Self-pay | Admitting: Internal Medicine

## 2020-04-22 DIAGNOSIS — M5136 Other intervertebral disc degeneration, lumbar region: Secondary | ICD-10-CM | POA: Diagnosis not present

## 2020-04-22 DIAGNOSIS — D8989 Other specified disorders involving the immune mechanism, not elsewhere classified: Secondary | ICD-10-CM | POA: Diagnosis not present

## 2020-04-22 DIAGNOSIS — Z9483 Pancreas transplant status: Secondary | ICD-10-CM | POA: Diagnosis not present

## 2020-04-22 DIAGNOSIS — M5432 Sciatica, left side: Secondary | ICD-10-CM

## 2020-04-22 DIAGNOSIS — R29898 Other symptoms and signs involving the musculoskeletal system: Secondary | ICD-10-CM

## 2020-04-22 DIAGNOSIS — E109 Type 1 diabetes mellitus without complications: Secondary | ICD-10-CM | POA: Diagnosis not present

## 2020-04-22 DIAGNOSIS — Z0000 Encounter for general adult medical examination without abnormal findings: Secondary | ICD-10-CM | POA: Diagnosis not present

## 2020-04-22 DIAGNOSIS — M5431 Sciatica, right side: Secondary | ICD-10-CM

## 2020-04-22 DIAGNOSIS — Z1331 Encounter for screening for depression: Secondary | ICD-10-CM | POA: Diagnosis not present

## 2020-04-22 DIAGNOSIS — I2510 Atherosclerotic heart disease of native coronary artery without angina pectoris: Secondary | ICD-10-CM | POA: Diagnosis not present

## 2020-04-22 DIAGNOSIS — E785 Hyperlipidemia, unspecified: Secondary | ICD-10-CM | POA: Diagnosis not present

## 2020-04-22 DIAGNOSIS — I739 Peripheral vascular disease, unspecified: Secondary | ICD-10-CM | POA: Diagnosis not present

## 2020-04-22 MED FILL — ROSUVASTATIN CALCIUM 5 MG T: 5 | 84 days supply | Qty: 36 | Fill #0

## 2020-04-22 MED FILL — METHOCARBAMOL 500 MG TABS: 500 | 30 days supply | Qty: 60 | Fill #0

## 2020-04-23 ENCOUNTER — Other Ambulatory Visit: Payer: Self-pay

## 2020-04-23 ENCOUNTER — Ambulatory Visit
Admission: RE | Admit: 2020-04-23 | Discharge: 2020-04-23 | Disposition: A | Discharge status: Home / Self Care | Payer: 59 | Source: Ambulatory Visit | Attending: Internal Medicine | Admitting: Internal Medicine

## 2020-04-23 DIAGNOSIS — R29898 Other symptoms and signs involving the musculoskeletal system: Secondary | ICD-10-CM | POA: Diagnosis not present

## 2020-04-23 DIAGNOSIS — M5432 Sciatica, left side: Secondary | ICD-10-CM | POA: Diagnosis not present

## 2020-04-23 DIAGNOSIS — M5431 Sciatica, right side: Secondary | ICD-10-CM

## 2020-04-23 DIAGNOSIS — M545 Low back pain: Secondary | ICD-10-CM | POA: Diagnosis not present

## 2020-04-23 NOTE — Procedures (Signed)
S1 Lumbosacral Transforaminal Epidural Steroid Injection - Sub-Pedicular Approach with Fluoroscopic Guidance   Patient: Gregory Allen      Date of Birth: 03-04-1971 MRN: [id number] PCP: Velna Hatchet, MD      Visit Date: 11/07/2019   Universal Protocol:    Date/Time: 06/17/219:02 AM  Consent Given By: the patient  Position:  PRONE  Additional Comments: Vital signs were monitored before and after the procedure. Patient was prepped and draped in the usual sterile fashion. The correct patient, procedure, and site was verified.   Injection Procedure Details:  Procedure Site One Meds Administered:  Meds ordered this encounter  Medications  . methylPREDNISolone acetate (DEPO-MEDROL) injection 40 mg    Laterality: Left  Location/Site:  S1 Foramen   Needle size: 22 ga.  Needle type: Spinal  Needle Placement: Transforaminal  Findings:   -Comments: Excellent flow of contrast along the nerve and into the epidural space.  Epidurogram: Contrast epidurogram showed no nerve root cut off or restricted flow pattern.  Procedure Details: After squaring off the sacral end-plate to get a true AP view, the C-arm was positioned so that the best possible view of the S1 foramen was visualized. The soft tissues overlying this structure were infiltrated with 2-3 ml. of 1% Lidocaine without Epinephrine.    The spinal needle was inserted toward the target using a "trajectory" view along the fluoroscope beam.  Under AP and lateral visualization, the needle was advanced so it did not puncture dura. Biplanar projections were used to confirm position. Aspiration was confirmed to be negative for CSF and/or blood. A 1-2 ml. volume of Isovue-250 was injected and flow of contrast was noted at each level. Radiographs were obtained for documentation purposes.   After attaining the desired flow of contrast documented above, a 0.5 to 1.0 ml test dose of 0.25% Marcaine was injected into each respective  transforaminal space.  The patient was observed for 90 seconds post injection.  After no sensory deficits were reported, and normal lower extremity motor function was noted,   the above injectate was administered so that equal amounts of the injectate were placed at each foramen (level) into the transforaminal epidural space.   Additional Comments:  The patient tolerated the procedure well Dressing: Band-Aid with 2 x 2 sterile gauze    Post-procedure details: Patient was observed during the procedure. Post-procedure instructions were reviewed.  Patient left the clinic in stable condition.

## 2020-04-23 NOTE — Progress Notes (Signed)
Gregory Allen - 49 y.o. male MRN [id number]  Date of birth: Aug 17, 1971  Office Visit Note: Visit Date: 11/07/2019 PCP: Velna Hatchet, MD Referred by: Velna Hatchet, MD  Subjective: Chief Complaint  Patient presents with  . Lower Back - Pain  . Left Thigh - Pain   HPI:  Gregory Allen is a 49 y.o. male who comes in today at the request of Dr. Anderson Malta for planned Left S1-2 Lumbar epidural steroid injection with fluoroscopic guidance.  The patient has failed conservative care including home exercise, medications, time and activity modification.  This injection will be diagnostic and hopefully therapeutic.  Please see requesting physician notes for further details and justification.   ROS Otherwise per HPI.  Assessment & Plan: Visit Diagnoses:  1. Lumbar radiculopathy   2. Radiculopathy due to lumbar intervertebral disc disorder   3. Post laminectomy syndrome     Plan: No additional findings.   Meds & Orders:  Meds ordered this encounter  Medications  . methylPREDNISolone acetate (DEPO-MEDROL) injection 40 mg    Orders Placed This Encounter  Procedures  . XR C-ARM NO REPORT  . Epidural Steroid injection    Follow-up: Return if symptoms worsen or fail to improve.   Procedures: No procedures performed  S1 Lumbosacral Transforaminal Epidural Steroid Injection - Sub-Pedicular Approach with Fluoroscopic Guidance   Patient: Gregory Allen      Date of Birth: [birth year] MRN: [id number] PCP: Velna Hatchet, MD      Visit Date: 11/07/2019   Universal Protocol:    Date/Time: 06/17/219:02 AM  Consent Given By: the patient  Position:  PRONE  Additional Comments: Vital signs were monitored before and after the procedure. Patient was prepped and draped in the usual sterile fashion. The correct patient, procedure, and site was verified.   Injection Procedure Details:  Procedure Site One Meds Administered:  Meds ordered this encounter  Medications  .  methylPREDNISolone acetate (DEPO-MEDROL) injection 40 mg    Laterality: Left  Location/Site:  S1 Foramen   Needle size: 22 ga.  Needle type: Spinal  Needle Placement: Transforaminal  Findings:   -Comments: Excellent flow of contrast along the nerve and into the epidural space.  Epidurogram: Contrast epidurogram showed no nerve root cut off or restricted flow pattern.  Procedure Details: After squaring off the sacral end-plate to get a true AP view, the C-arm was positioned so that the best possible view of the S1 foramen was visualized. The soft tissues overlying this structure were infiltrated with 2-3 ml. of 1% Lidocaine without Epinephrine.    The spinal needle was inserted toward the target using a "trajectory" view along the fluoroscope beam.  Under AP and lateral visualization, the needle was advanced so it did not puncture dura. Biplanar projections were used to confirm position. Aspiration was confirmed to be negative for CSF and/or blood. A 1-2 ml. volume of Isovue-250 was injected and flow of contrast was noted at each level. Radiographs were obtained for documentation purposes.   After attaining the desired flow of contrast documented above, a 0.5 to 1.0 ml test dose of 0.25% Marcaine was injected into each respective transforaminal space.  The patient was observed for 90 seconds post injection.  After no sensory deficits were reported, and normal lower extremity motor function was noted,   the above injectate was administered so that equal amounts of the injectate were placed at each foramen (level) into the transforaminal epidural space.   Additional Comments:  The patient tolerated the procedure  well Dressing: Band-Aid with 2 x 2 sterile gauze    Post-procedure details: Patient was observed during the procedure. Post-procedure instructions were reviewed.  Patient left the clinic in stable condition.     Clinical History: MRI LUMBAR SPINE WITHOUT  CONTRAST  TECHNIQUE: Multiplanar, multisequence MR imaging of the lumbar spine was performed. No intravenous contrast was administered.  COMPARISON:  Radiography 12/21/2018  FINDINGS: Segmentation:  5 lumbar type vertebral bodies.  Alignment:  Curvature convex to the right with the apex at L2-3.  Vertebrae:  No fracture or primary bone lesion.  Conus medullaris and cauda equina: Conus extends to the L1 level. Conus and cauda equina appear normal.  Paraspinal and other soft tissues: Negative  Disc levels:  No significant finding at L3-4 or above.  L4-5: Mild bulging of the disc. Mild facet and ligamentous hypertrophy. No compressive central canal narrowing. Mild bilateral foraminal stenosis but without definite compression of the exiting L3 nerves.  L5-S1: Previous partial right hemilaminectomy. Central disc herniation contacts the thecal sac in the S1 root sleeves. Compressive stenosis is not demonstrated. There is foraminal stenosis on the right due to encroachment by facet osteophytes, endplate osteophytes and bulging disc material that could affect the exiting right L5 nerve.  IMPRESSION: Curvature convex to the right with the apex at L2-3.  L4-5: Disc bulge. Facet degeneration and hypertrophy. Mild bilateral foraminal stenosis which could affect either L4 nerve. The facet osteoarthritis could be symptomatic.  L5-S1: Central disc herniation but without neural compression. This could cause neural irritation. Previous right hemilaminectomy. Right foraminal stenosis due to encroachment by endplate osteophytes, bulging disc and facet osteophytes could affect the exiting right L5 nerve.   Electronically Signed   By: Nelson Chimes M.D.   On: 01/04/2019 12:50     Objective:  VS:  HT:    WT:   BMI:     BP:129/83  HR:75bpm  TEMP: ( )  RESP:  Physical Exam Constitutional:      General: He is not in acute distress.    Appearance: Normal  appearance. He is not ill-appearing.  HENT:     Head: Normocephalic and atraumatic.     Right Ear: External ear normal.     Left Ear: External ear normal.  Eyes:     Extraocular Movements: Extraocular movements intact.  Cardiovascular:     Rate and Rhythm: Normal rate.     Pulses: Normal pulses.  Abdominal:     General: There is no distension.     Palpations: Abdomen is soft.  Musculoskeletal:        General: No tenderness or signs of injury.     Right lower leg: No edema.     Left lower leg: No edema.     Comments: Patient has good distal strength without clonus.  Skin:    Findings: No erythema or rash.  Neurological:     General: No focal deficit present.     Mental Status: He is alert and oriented to person, place, and time.     Sensory: No sensory deficit.     Motor: No weakness or abnormal muscle tone.     Coordination: Coordination normal.  Psychiatric:        Mood and Affect: Mood normal.        Behavior: Behavior normal.      Imaging: No results found.

## 2020-04-28 MED FILL — predniSONE 5 MG TABS: 5 | 30 days supply | Qty: 60 | Fill #1

## 2020-05-04 MED FILL — SULFAMETHOXAZOLE-TMP SS TAB: 400-80 | 28 days supply | Qty: 12 | Fill #4

## 2020-05-07 DIAGNOSIS — B078 Other viral warts: Secondary | ICD-10-CM | POA: Diagnosis not present

## 2020-05-07 DIAGNOSIS — D1722 Benign lipomatous neoplasm of skin and subcutaneous tissue of left arm: Secondary | ICD-10-CM | POA: Diagnosis not present

## 2020-05-11 MED FILL — AMLODIPINE BESYLATE 10 MG T: 10 | 90 days supply | Qty: 90 | Fill #0

## 2020-05-11 MED FILL — MYCOPHENOLATE SODIUM 180 MG: 180 | 30 days supply | Qty: 120 | Fill #4

## 2020-05-18 MED FILL — PENTOXIFYLLINE 400 MG TAB S: 400 | 30 days supply | Qty: 60 | Fill #1

## 2020-05-18 MED FILL — GABAPENTIN 300 MG CAPSULE: 300 | 30 days supply | Qty: 60 | Fill #1

## 2020-05-18 MED FILL — TACROLIMUS 1 MG CAPSULE: 1 | 30 days supply | Qty: 240 | Fill #5

## 2020-05-19 DIAGNOSIS — G629 Polyneuropathy, unspecified: Secondary | ICD-10-CM | POA: Diagnosis not present

## 2020-05-19 MED FILL — DULoxetine HCL 30 MG CPEP: 30 | 30 days supply | Qty: 60 | Fill #0

## 2020-05-21 ENCOUNTER — Telehealth (INDEPENDENT_AMBULATORY_CARE_PROVIDER_SITE_OTHER): Payer: 59 | Admitting: Pharmacist

## 2020-05-21 ENCOUNTER — Encounter: Payer: Self-pay | Admitting: Pharmacist

## 2020-05-21 DIAGNOSIS — Z79899 Other long term (current) drug therapy: Secondary | ICD-10-CM

## 2020-05-21 NOTE — Progress Notes (Signed)
   S: Patient presents today via telephone for review of his specialty medication.   Patient is currently taking mycophenolate for kidney and pancreas transplant. 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   Patient is managed by [facility] for this. He has been on mycophenolate for many years now.   Adherence: denies any missed doses  Efficacy: reports that it is still working well for him.  Dose: Mycophenolate: Take 2 tablets (360mg ) by mouth every morning and 2 tablets (360mg ) at night.   Adverse effects: Infection: denies Cardiovascular effects (hyper/hypotension, edema, tachycardia): denies CNS effects (pain, headache, insomnia, dizziness): denies GI effects (N/V/D, pain): denies. I see in a recent note from the Transplant clinic that he did have one episode of vomiting but it resolved after that Hematologic effects: denies, gets regular blood work with the transplant clinic.   O: [facility] at [facility].  All labs (05/19/20) normal with the exception of the following: Hgb 13.6 MCH 27 MCHC 32.7 Hgb A1c 6.4 Glucose 132 Magnesium 1.6  Tacrolimus level within expected range.  A/P: 1. Medication review: Patient is currently taking mycophenolate s/p kidney transplant and is tolerating it well with no adverse effects. Patient has been on medication for many years and denies any questions or concerns. Understands that adherence is critical to avoid organ rejection. No recommendations for any changes, patient to follow up with pharmacist in 1 year.   Christella Hartigan, PharmD, BCPS, BCACP, CPP Clinical Pharmacist Practitioner

## 2020-05-25 MED FILL — METHOCARBAMOL 500 MG TABS: 500 | 30 days supply | Qty: 60 | Fill #1

## 2020-05-27 MED FILL — SULFAMETHOXAZOLE-TMP SS TAB: 400-80 | 28 days supply | Qty: 12 | Fill #5

## 2020-05-28 ENCOUNTER — Telehealth: Payer: Self-pay | Admitting: *Deleted

## 2020-05-28 ENCOUNTER — Other Ambulatory Visit: Payer: Self-pay | Admitting: *Deleted

## 2020-05-28 DIAGNOSIS — I779 Disorder of arteries and arterioles, unspecified: Secondary | ICD-10-CM

## 2020-05-28 DIAGNOSIS — I70219 Atherosclerosis of native arteries of extremities with intermittent claudication, unspecified extremity: Secondary | ICD-10-CM

## 2020-05-28 NOTE — Telephone Encounter (Signed)
Patient called c/o left leg cramping during the night and numbness of his right foot. He fell back onto the bed when trying to stand. This morning, 05/28/2020 he states that his left calf is very sore and is experiencing sharp pain across the top of his foot.  After discussing with Risa Grill PA, an ABI was scheduled for 05/29/20

## 2020-05-28 NOTE — Telephone Encounter (Signed)
An appointment was scheduled 06/04/2020 at 10:30 with Dr Oneida Alar.

## 2020-05-29 ENCOUNTER — Encounter (HOSPITAL_COMMUNITY): Payer: 59

## 2020-06-01 MED FILL — predniSONE 5 MG TABS: 5 | 30 days supply | Qty: 60 | Fill #2

## 2020-06-04 ENCOUNTER — Ambulatory Visit (INDEPENDENT_AMBULATORY_CARE_PROVIDER_SITE_OTHER): Payer: 59 | Admitting: Vascular Surgery

## 2020-06-04 ENCOUNTER — Ambulatory Visit (HOSPITAL_COMMUNITY)
Admission: RE | Admit: 2020-06-04 | Discharge: 2020-06-04 | Disposition: A | Discharge status: Home / Self Care | Payer: 59 | Source: Ambulatory Visit | Attending: Physician Assistant | Admitting: Physician Assistant

## 2020-06-04 ENCOUNTER — Other Ambulatory Visit: Payer: Self-pay

## 2020-06-04 ENCOUNTER — Encounter: Payer: Self-pay | Admitting: Vascular Surgery

## 2020-06-04 VITALS — BP 135/84 | HR 75 | Temp 98.2°F | Ht 70.0 in | Wt 207.5 lb

## 2020-06-04 DIAGNOSIS — G609 Hereditary and idiopathic neuropathy, unspecified: Secondary | ICD-10-CM | POA: Diagnosis not present

## 2020-06-04 DIAGNOSIS — I739 Peripheral vascular disease, unspecified: Secondary | ICD-10-CM | POA: Diagnosis not present

## 2020-06-04 DIAGNOSIS — I70219 Atherosclerosis of native arteries of extremities with intermittent claudication, unspecified extremity: Secondary | ICD-10-CM | POA: Diagnosis not present

## 2020-06-04 DIAGNOSIS — D1722 Benign lipomatous neoplasm of skin and subcutaneous tissue of left arm: Secondary | ICD-10-CM | POA: Diagnosis not present

## 2020-06-04 DIAGNOSIS — I779 Disorder of arteries and arterioles, unspecified: Secondary | ICD-10-CM

## 2020-06-04 DIAGNOSIS — M79605 Pain in left leg: Secondary | ICD-10-CM | POA: Diagnosis not present

## 2020-06-04 NOTE — Progress Notes (Signed)
Patient name: Gregory Allen MRN: [id number] DOB: 1971/08/20 Sex: male  HPI: Gregory Allen is a 49 y.o. male, with a known history of peripheral arterial disease and bilateral leg pain. He had been followed by Dr. Donzetta Matters since 2018. Current thinking has been that his leg pain is probably multifactorial in nature and potentially tibial disease. Patient states that his walking distance has become shorter and shorter over the last several years. He now complains of intermittent numbness and tingling in his right foot. He states that his left calf frequently cramps although this is usually at nighttime and not with walking. Patient also has a previous history of a back operation several years ago. He states he had this done for pain and did not have numbness at that time. He does not smoke. He does have a history of diabetes and has had a kidney pancreas transplant. He states his walking has gotten to the point where his legs give way and he is actually fallen several times. He has no open wounds on the feet. Review of his most recent serum creatinine from May of this year [facility] shows a creatinine of 0.9. He is currently on aspirin Pletal. He is not on a statin.  Past Medical History:  Diagnosis Date  . Diabetes mellitus without complication [facility])    Past Surgical History:  Procedure Laterality Date  . KIDNEY TRANSPLANT    . TRANSPLANT PANCREATIC ALLOGRAFT      Family History  Problem Relation Age of Onset  . Diabetes Father     SOCIAL HISTORY: Social History   Socioeconomic History  . Marital status: Married    Spouse name: Not on file  . Number of children: Not on file  . Years of education: Not on file  . Highest education level: Not on file  Occupational History  . Not on file  Tobacco Use  . Smoking status: Never Smoker  . Smokeless tobacco: Never Used  Vaping Use  . Vaping Use: Never used  Substance and Sexual Activity  . Alcohol use: No    Alcohol/week: 0.0  standard drinks  . Drug use: No  . Sexual activity: Yes  Other Topics Concern  . Not on file  Social History Narrative  . Not on file   Social Determinants of Health   Financial Resource Strain:   . Difficulty of Paying Living Expenses:   Food Insecurity:   . Worried About Charity fundraiser in the Last Year:   . Arboriculturist in the Last Year:   Transportation Needs:   . Film/video editor (Medical):   Marland Kitchen Lack of Transportation (Non-Medical):   Physical Activity:   . Days of Exercise per Week:   . Minutes of Exercise per Session:   Stress:   . Feeling of Stress :   Social Connections:   . Frequency of Communication with Friends and Family:   . Frequency of Social Gatherings with Friends and Family:   . Attends Religious Services:   . Active Member of Clubs or Organizations:   . Attends Archivist Meetings:   Marland Kitchen Marital Status:   Intimate Partner Violence:   . Fear of Current or Ex-Partner:   . Emotionally Abused:   Marland Kitchen Physically Abused:   . Sexually Abused:     Allergies  Allergen Reactions  . Shellfish-Derived Products Itching    "throat starts itching" pt states "he is ok with iodine"  . Other Other (See Comments)  Shellfish per pt  . Shellfish Allergy     Current Outpatient Medications  Medication Sig Dispense Refill  . amLODipine (NORVASC) 10 MG tablet in the morning and at bedtime.     Marland Kitchen aspirin EC 81 MG tablet Take by mouth.    . carvedilol (COREG) 12.5 MG tablet Take 12.5 mg by mouth 2 (two) times daily with a meal.    . cilostazol (PLETAL) 100 MG tablet Take 1 tablet (100 mg total) by mouth 2 (two) times daily before a meal. 60 tablet 11  . DULoxetine (CYMBALTA) 30 MG capsule Take 30 mg by mouth in the morning and at bedtime.    . methocarbamol (ROBAXIN) 500 MG tablet Take 500 mg by mouth 2 (two) times daily as needed.    . mycophenolate (MYFORTIC) 180 MG EC tablet Take 1 tablet (180 mg total) by mouth 2 (two) times daily. 60 tablet 5  .  sulfamethoxazole-trimethoprim (BACTRIM,SEPTRA) 400-80 MG tablet TAKE 1 TABLET BY MOUTH EVERY MONDAY, WENESDAY, AND FRIDAY  0  . tacrolimus (PROGRAF) 1 MG capsule Take by mouth.     No current facility-administered medications for this visit.    ROS:   General:  No weight loss, Fever, chills  HEENT: No recent headaches, no nasal bleeding, no visual changes, no sore throat  Neurologic: No dizziness, blackouts, seizures. No recent symptoms of stroke or mini- stroke. No recent episodes of slurred speech, or temporary blindness.  Cardiac: No recent episodes of chest pain/pressure, no shortness of breath at rest.  No shortness of breath with exertion.  Denies history of atrial fibrillation or irregular heartbeat  Vascular: No history of rest pain in feet.  No history of claudication.  No history of non-healing ulcer, No history of DVT   Pulmonary: No home oxygen, no productive cough, no hemoptysis,  No asthma or wheezing  Musculoskeletal:  [ ]  Arthritis, [X]  Low back pain,  [ ]  Joint pain  Hematologic:No history of hypercoagulable state.  No history of easy bleeding.  No history of anemia  Gastrointestinal: No hematochezia or melena,  No gastroesophageal reflux, no trouble swallowing  Urinary: [X]  chronic Kidney disease, [ ]  on HD - [ ]  MWF or [ ]  TTHS, [ ]  Burning with urination, [ ]  Frequent urination, [ ]  Difficulty urinating;   Skin: No rashes  Psychological: No history of anxiety,  No history of depression   Physical Examination  Vitals:   06/04/20 0940  BP: (!) 135/84  Pulse: 75  Temp: 98.2 F (36.8 C)  SpO2: 95%  Weight: (!) 207 lb 8 oz (94.1 kg)  Height: 5\' 10"  (1.778 m)    Body mass index is 29.77 kg/m.  General:  Alert and oriented, no acute distress HEENT: Normal Neck: No JVD Cardiac: Regular Rate and Rhythm Skin: No rash, no ulcer dark spot on the plantar aspect of his right heel consistent with either a bruise or callus Extremity Pulses:  2+ radial,  brachial, femoral, absent dorsalis pedis, posterior tibial pulses bilaterally Musculoskeletal: No deformity or edema  Neurologic: Upper and lower extremity motor 5/5 and symmetric  DATA:  Patient had bilateral ABIs performed April 2021 which were 0.9 bilaterally with toe pressures greater than 100. Today he had an additional ABI performed which showed a toe pressure on the right side of 77 otherwise greater than 1 ABIs triphasic  ASSESSMENT: Patient with leg symptoms that are probably multifactorial but may have a peripheral arterial disease component. Although his ABIs are normal he could potentially  be calcified and his exam is consistent with possible tibial disease. I had a lengthy discussion with the patient and his wife today. I believe the only way we will know what the overall prognosis is going to be would be to get an lower extremity arteriogram to get the definitive answer on whether or not his leg pain is related to his arteries. I did discuss with him there is some risk of contrast nephropathy to his kidney transplant but with a normal serum creatinine this should overall be low. Other risk include bleeding possible infection. I also discussed with him that if this is a lesion related to a smaller vessel in the popliteal or tibial vessels I would not consider an intervention for this unless he was at risk for limb loss. He currently is not at this stage. We would consider treating an SFA or iliac lesion if we find that.   PLAN: Aortogram bilateral lower extremity runoff possible intervention scheduled for June 12, 2020.   [name], MD [facility] [place] Office: [phone] Pager: [phone]

## 2020-06-04 NOTE — H&P (View-Only) (Signed)
Patient name: Gregory Allen MRN: [id number] DOB: 06-Apr-1971 Sex: male  HPI: Gregory Allen is a 49 y.o. male, with a known history of peripheral arterial disease and bilateral leg pain. He had been followed by Dr. Donzetta Matters since 2018. Current thinking has been that his leg pain is probably multifactorial in nature and potentially tibial disease. Patient states that his walking distance has become shorter and shorter over the last several years. He now complains of intermittent numbness and tingling in his right foot. He states that his left calf frequently cramps although this is usually at nighttime and not with walking. Patient also has a previous history of a back operation several years ago. He states he had this done for pain and did not have numbness at that time. He does not smoke. He does have a history of diabetes and has had a kidney pancreas transplant. He states his walking has gotten to the point where his legs give way and he is actually fallen several times. He has no open wounds on the feet. Review of his most recent serum creatinine from May of this year [facility] shows a creatinine of 0.9. He is currently on aspirin Pletal. He is not on a statin.  Past Medical History:  Diagnosis Date  . Diabetes mellitus without complication [facility])    Past Surgical History:  Procedure Laterality Date  . KIDNEY TRANSPLANT    . TRANSPLANT PANCREATIC ALLOGRAFT      Family History  Problem Relation Age of Onset  . Diabetes Father     SOCIAL HISTORY: Social History   Socioeconomic History  . Marital status: Married    Spouse name: Not on file  . Number of children: Not on file  . Years of education: Not on file  . Highest education level: Not on file  Occupational History  . Not on file  Tobacco Use  . Smoking status: Never Smoker  . Smokeless tobacco: Never Used  Vaping Use  . Vaping Use: Never used  Substance and Sexual Activity  . Alcohol use: No    Alcohol/week: 0.0  standard drinks  . Drug use: No  . Sexual activity: Yes  Other Topics Concern  . Not on file  Social History Narrative  . Not on file   Social Determinants of Health   Financial Resource Strain:   . Difficulty of Paying Living Expenses:   Food Insecurity:   . Worried About Charity fundraiser in the Last Year:   . Arboriculturist in the Last Year:   Transportation Needs:   . Film/video editor (Medical):   Marland Kitchen Lack of Transportation (Non-Medical):   Physical Activity:   . Days of Exercise per Week:   . Minutes of Exercise per Session:   Stress:   . Feeling of Stress :   Social Connections:   . Frequency of Communication with Friends and Family:   . Frequency of Social Gatherings with Friends and Family:   . Attends Religious Services:   . Active Member of Clubs or Organizations:   . Attends Archivist Meetings:   Marland Kitchen Marital Status:   Intimate Partner Violence:   . Fear of Current or Ex-Partner:   . Emotionally Abused:   Marland Kitchen Physically Abused:   . Sexually Abused:     Allergies  Allergen Reactions  . Shellfish-Derived Products Itching    "throat starts itching" pt states "he is ok with iodine"  . Other Other (See Comments)  Shellfish per pt  . Shellfish Allergy     Current Outpatient Medications  Medication Sig Dispense Refill  . amLODipine (NORVASC) 10 MG tablet in the morning and at bedtime.     Marland Kitchen aspirin EC 81 MG tablet Take by mouth.    . carvedilol (COREG) 12.5 MG tablet Take 12.5 mg by mouth 2 (two) times daily with a meal.    . cilostazol (PLETAL) 100 MG tablet Take 1 tablet (100 mg total) by mouth 2 (two) times daily before a meal. 60 tablet 11  . DULoxetine (CYMBALTA) 30 MG capsule Take 30 mg by mouth in the morning and at bedtime.    . methocarbamol (ROBAXIN) 500 MG tablet Take 500 mg by mouth 2 (two) times daily as needed.    . mycophenolate (MYFORTIC) 180 MG EC tablet Take 1 tablet (180 mg total) by mouth 2 (two) times daily. 60 tablet 5  .  sulfamethoxazole-trimethoprim (BACTRIM,SEPTRA) 400-80 MG tablet TAKE 1 TABLET BY MOUTH EVERY MONDAY, WENESDAY, AND FRIDAY  0  . tacrolimus (PROGRAF) 1 MG capsule Take by mouth.     No current facility-administered medications for this visit.    ROS:   General:  No weight loss, Fever, chills  HEENT: No recent headaches, no nasal bleeding, no visual changes, no sore throat  Neurologic: No dizziness, blackouts, seizures. No recent symptoms of stroke or mini- stroke. No recent episodes of slurred speech, or temporary blindness.  Cardiac: No recent episodes of chest pain/pressure, no shortness of breath at rest.  No shortness of breath with exertion.  Denies history of atrial fibrillation or irregular heartbeat  Vascular: No history of rest pain in feet.  No history of claudication.  No history of non-healing ulcer, No history of DVT   Pulmonary: No home oxygen, no productive cough, no hemoptysis,  No asthma or wheezing  Musculoskeletal:  [ ]  Arthritis, [X]  Low back pain,  [ ]  Joint pain  Hematologic:No history of hypercoagulable state.  No history of easy bleeding.  No history of anemia  Gastrointestinal: No hematochezia or melena,  No gastroesophageal reflux, no trouble swallowing  Urinary: [X]  chronic Kidney disease, [ ]  on HD - [ ]  MWF or [ ]  TTHS, [ ]  Burning with urination, [ ]  Frequent urination, [ ]  Difficulty urinating;   Skin: No rashes  Psychological: No history of anxiety,  No history of depression   Physical Examination  Vitals:   06/04/20 0940  BP: (!) 135/84  Pulse: 75  Temp: 98.2 F (36.8 C)  SpO2: 95%  Weight: (!) 207 lb 8 oz (94.1 kg)  Height: 5\' 10"  (1.778 m)    Body mass index is 29.77 kg/m.  General:  Alert and oriented, no acute distress HEENT: Normal Neck: No JVD Cardiac: Regular Rate and Rhythm Skin: No rash, no ulcer dark spot on the plantar aspect of his right heel consistent with either a bruise or callus Extremity Pulses:  2+ radial,  brachial, femoral, absent dorsalis pedis, posterior tibial pulses bilaterally Musculoskeletal: No deformity or edema  Neurologic: Upper and lower extremity motor 5/5 and symmetric  DATA:  Patient had bilateral ABIs performed April 2021 which were 0.9 bilaterally with toe pressures greater than 100. Today he had an additional ABI performed which showed a toe pressure on the right side of 77 otherwise greater than 1 ABIs triphasic  ASSESSMENT: Patient with leg symptoms that are probably multifactorial but may have a peripheral arterial disease component. Although his ABIs are normal he could potentially  be calcified and his exam is consistent with possible tibial disease. I had a lengthy discussion with the patient and his wife today. I believe the only way we will know what the overall prognosis is going to be would be to get an lower extremity arteriogram to get the definitive answer on whether or not his leg pain is related to his arteries. I did discuss with him there is some risk of contrast nephropathy to his kidney transplant but with a normal serum creatinine this should overall be low. Other risk include bleeding possible infection. I also discussed with him that if this is a lesion related to a smaller vessel in the popliteal or tibial vessels I would not consider an intervention for this unless he was at risk for limb loss. He currently is not at this stage. We would consider treating an SFA or iliac lesion if we find that.   PLAN: Aortogram bilateral lower extremity runoff possible intervention scheduled for June 12, 2020.   [name], MD [facility] [place] Office: [phone] Pager: [phone]

## 2020-06-05 ENCOUNTER — Other Ambulatory Visit: Payer: Self-pay

## 2020-06-05 DIAGNOSIS — Z9489 Other transplanted organ and tissue status: Secondary | ICD-10-CM | POA: Diagnosis not present

## 2020-06-08 ENCOUNTER — Other Ambulatory Visit (HOSPITAL_COMMUNITY): Payer: 59

## 2020-06-09 ENCOUNTER — Other Ambulatory Visit (HOSPITAL_COMMUNITY)
Admission: RE | Admit: 2020-06-09 | Discharge: 2020-06-09 | Disposition: A | Discharge status: Home / Self Care | Payer: 59 | Source: Ambulatory Visit | Attending: Vascular Surgery | Admitting: Vascular Surgery

## 2020-06-09 DIAGNOSIS — Z01812 Encounter for preprocedural laboratory examination: Secondary | ICD-10-CM | POA: Diagnosis not present

## 2020-06-09 DIAGNOSIS — Z20822 Contact with and (suspected) exposure to covid-19: Secondary | ICD-10-CM | POA: Diagnosis not present

## 2020-06-10 LAB — SARS CORONAVIRUS 2 (TAT 6-24 HRS): SARS Coronavirus 2: NEGATIVE

## 2020-06-12 ENCOUNTER — Other Ambulatory Visit: Payer: Self-pay

## 2020-06-12 ENCOUNTER — Encounter (HOSPITAL_COMMUNITY)
Admission: RE | Disposition: A | Discharge status: Home / Self Care | Payer: Self-pay | Source: Home / Self Care | Attending: Vascular Surgery

## 2020-06-12 ENCOUNTER — Ambulatory Visit (HOSPITAL_COMMUNITY)
Admission: RE | Admit: 2020-06-12 | Discharge: 2020-06-12 | Disposition: A | Discharge status: Home / Self Care | Payer: 59 | Attending: Vascular Surgery | Admitting: Vascular Surgery

## 2020-06-12 DIAGNOSIS — Z7982 Long term (current) use of aspirin: Secondary | ICD-10-CM | POA: Insufficient documentation

## 2020-06-12 DIAGNOSIS — Z9483 Pancreas transplant status: Secondary | ICD-10-CM | POA: Diagnosis not present

## 2020-06-12 DIAGNOSIS — I70213 Atherosclerosis of native arteries of extremities with intermittent claudication, bilateral legs: Secondary | ICD-10-CM | POA: Diagnosis not present

## 2020-06-12 DIAGNOSIS — Z940 Kidney transplant status: Secondary | ICD-10-CM | POA: Diagnosis not present

## 2020-06-12 DIAGNOSIS — Z79899 Other long term (current) drug therapy: Secondary | ICD-10-CM | POA: Diagnosis not present

## 2020-06-12 DIAGNOSIS — E1151 Type 2 diabetes mellitus with diabetic peripheral angiopathy without gangrene: Secondary | ICD-10-CM | POA: Diagnosis not present

## 2020-06-12 HISTORY — PX: ABDOMINAL AORTOGRAM W/LOWER EXTREMITY: CATH118223

## 2020-06-12 LAB — POCT I-STAT, CHEM 8
BUN: 14 mg/dL (ref 6–20)
Calcium, Ion: 1.16 mmol/L (ref 1.15–1.40)
Chloride: 103 mmol/L (ref 98–111)
Creatinine, Ser: 0.9 mg/dL (ref 0.61–1.24)
Glucose, Bld: 114 mg/dL — ABNORMAL HIGH (ref 70–99)
HCT: 43 % (ref 39.0–52.0)
Hemoglobin: 14.6 g/dL (ref 13.0–17.0)
Potassium: 3.5 mmol/L (ref 3.5–5.1)
Sodium: 144 mmol/L (ref 135–145)
TCO2: 24 mmol/L (ref 22–32)

## 2020-06-12 LAB — GLUCOSE, CAPILLARY: Glucose-Capillary: 120 mg/dL — ABNORMAL HIGH (ref 70–99)

## 2020-06-12 SURGERY — ABDOMINAL AORTOGRAM W/LOWER EXTREMITY
Anesthesia: LOCAL | Laterality: Bilateral

## 2020-06-12 MED ORDER — SODIUM CHLORIDE 0.9% FLUSH: 3.0000 mL | INTRAVENOUS | Status: DC | PRN

## 2020-06-12 MED ORDER — SODIUM CHLORIDE 0.9 % IV SOLN: INTRAVENOUS | Status: AC

## 2020-06-12 MED ORDER — HYDRALAZINE HCL 20 MG/ML IJ SOLN: 5.0000 mg | INTRAMUSCULAR | Status: DC | PRN

## 2020-06-12 MED ORDER — OXYCODONE HCL 5 MG PO TABS: 5.0000 mg | ORAL_TABLET | ORAL | Status: DC | PRN

## 2020-06-12 MED ORDER — HEPARIN (PORCINE) IN NACL 1000-0.9 UT/500ML-% IV SOLN
INTRAVENOUS | Status: DC | PRN
  Administered 2020-06-12 (×2): 500 mL

## 2020-06-12 MED ORDER — ACETAMINOPHEN 325 MG PO TABS
ORAL_TABLET | ORAL | Status: AC
  Filled 2020-06-12: qty 2

## 2020-06-12 MED ORDER — ACETAMINOPHEN 325 MG PO TABS
650.0000 mg | ORAL_TABLET | ORAL | Status: DC | PRN
  Administered 2020-06-12: 650 mg via ORAL

## 2020-06-12 MED ORDER — SODIUM CHLORIDE 0.9% FLUSH: 3.0000 mL | Freq: Two times a day (BID) | INTRAVENOUS | Status: DC

## 2020-06-12 MED ORDER — LIDOCAINE HCL (PF) 1 % IJ SOLN
INTRAMUSCULAR | Status: AC
  Filled 2020-06-12: qty 30

## 2020-06-12 MED ORDER — SODIUM CHLORIDE 0.9 % IV SOLN: 250.0000 mL | INTRAVENOUS | Status: DC | PRN

## 2020-06-12 MED ORDER — IODIXANOL 320 MG/ML IV SOLN
INTRAVENOUS | Status: DC | PRN
  Administered 2020-06-12: 200 mL via INTRA_ARTERIAL

## 2020-06-12 MED ORDER — SODIUM CHLORIDE 0.9 % IV SOLN: INTRAVENOUS | Status: DC

## 2020-06-12 MED ORDER — HEPARIN (PORCINE) IN NACL 1000-0.9 UT/500ML-% IV SOLN
INTRAVENOUS | Status: AC
  Filled 2020-06-12: qty 1000

## 2020-06-12 MED ORDER — MORPHINE SULFATE (PF) 2 MG/ML IV SOLN: 2.0000 mg | INTRAVENOUS | Status: DC | PRN

## 2020-06-12 MED ORDER — LABETALOL HCL 5 MG/ML IV SOLN: 10.0000 mg | INTRAVENOUS | Status: DC | PRN

## 2020-06-12 MED ORDER — LIDOCAINE HCL (PF) 1 % IJ SOLN
INTRAMUSCULAR | Status: DC | PRN
  Administered 2020-06-12: 18 mL via INTRADERMAL

## 2020-06-12 MED ORDER — ONDANSETRON HCL 4 MG/2ML IJ SOLN: 4.0000 mg | Freq: Four times a day (QID) | INTRAMUSCULAR | Status: DC | PRN

## 2020-06-12 SURGICAL SUPPLY — 12 items
CATH ANGIO 5F PIGTAIL 65CM (CATHETERS) ×2 IMPLANT
CATH CROSS OVER TEMPO 5F (CATHETERS) ×2 IMPLANT
CATH STRAIGHT 5FR 65CM (CATHETERS) ×2 IMPLANT
GUIDEWIRE ANGLED .035X150CM (WIRE) ×2 IMPLANT
KIT PV (KITS) ×2 IMPLANT
SHEATH PINNACLE 5F 10CM (SHEATH) ×2 IMPLANT
SHEATH PROBE COVER 6X72 (BAG) ×2 IMPLANT
SYR MEDRAD MARK V 150ML (SYRINGE) ×2 IMPLANT
TRANSDUCER W/STOPCOCK (MISCELLANEOUS) ×2 IMPLANT
TRAY PV CATH (CUSTOM PROCEDURE TRAY) ×2 IMPLANT
TUBING CIL FLEX 10 FLL-RA (TUBING) ×2 IMPLANT
WIRE HITORQ VERSACORE ST 145CM (WIRE) ×4 IMPLANT

## 2020-06-12 NOTE — Interval H&P Note (Signed)
History and Physical Interval Note:  06/12/2020 12:04 PM  Gregory Allen  has presented today for surgery, with the diagnosis of PAD.  The various methods of treatment have been discussed with the patient and family. After consideration of risks, benefits and other options for treatment, the patient has consented to  Procedure(s): ABDOMINAL AORTOGRAM W/LOWER EXTREMITY (Bilateral) as a surgical intervention.  The patient's history has been reviewed, patient examined, no change in status, stable for surgery.  I have reviewed the patient's chart and labs.  Questions were answered to the patient's satisfaction.     [name]

## 2020-06-12 NOTE — Discharge Instructions (Signed)
Angiogram, Care After This sheet gives you information about how to care for yourself after your procedure. Your health care provider may also give you more specific instructions. If you have problems or questions, contact your health care provider. What can I expect after the procedure? After the procedure, it is common to have bruising and tenderness at the catheter insertion area. Follow these instructions at home: Insertion site care  Follow instructions from your health care provider about how to take care of your insertion site. Make sure you: ? Wash your hands with soap and water before you change your bandage (dressing). If soap and water are not available, use hand sanitizer. ? Change your dressing as told by your health care provider. ? Leave stitches (sutures), skin glue, or adhesive strips in place. These skin closures may need to stay in place for 2 weeks or longer. If adhesive strip edges start to loosen and curl up, you may trim the loose edges. Do not remove adhesive strips completely unless your health care provider tells you to do that.  Do not take baths, swim, or use a hot tub until your health care provider approves.  You may shower 24-48 hours after the procedure or as told by your health care provider. ? Gently wash the site with plain soap and water. ? Pat the area dry with a clean towel. ? Do not rub the site. This may cause bleeding.  Do not apply powder or lotion to the site. Keep the site clean and dry.  Check your insertion site every day for signs of infection. Check for: ? Redness, swelling, or pain. ? Fluid or blood. ? Warmth. ? Pus or a bad smell. Activity  Rest as told by your health care provider, usually for 1-2 days.  Do not lift anything that is heavier than 10 lbs. (4.5 kg) or as told by your health care provider.  Do not drive for 24 hours if you were given a medicine to help you relax (sedative).  Do not drive or use heavy machinery while  taking prescription pain medicine. General instructions   Return to your normal activities as told by your health care provider, usually in about a week. Ask your health care provider what activities are safe for you.  If the catheter site starts bleeding, lie flat and put pressure on the site. If the bleeding does not stop, get help right away. This is a medical emergency.  Drink enough fluid to keep your urine clear or pale yellow. This helps flush the contrast dye from your body.  Take over-the-counter and prescription medicines only as told by your health care provider.  Keep all follow-up visits as told by your health care provider. This is important. Contact a health care provider if:  You have a fever or chills.  You have redness, swelling, or pain around your insertion site.  You have fluid or blood coming from your insertion site.  The insertion site feels warm to the touch.  You have pus or a bad smell coming from your insertion site.  You have bruising around the insertion site.  You notice blood collecting in the tissue around the catheter site (hematoma). The hematoma may be painful to the touch. Get help right away if:  You have severe pain at the catheter insertion area.  The catheter insertion area swells very fast.  The catheter insertion area is bleeding, and the bleeding does not stop when you hold steady pressure on the area.    The area near or just beyond the catheter insertion site becomes pale, cool, tingly, or numb. These symptoms may represent a serious problem that is an emergency. Do not wait to see if the symptoms will go away. Get medical help right away. Call your local emergency services (911 in the [place] Do not drive yourself to the hospital. Summary  After the procedure, it is common to have bruising and tenderness at the catheter insertion area.  After the procedure, it is important to rest and drink plenty of fluids.  Do not take baths,  swim, or use a hot tub until your health care provider says it is okay to do so. You may shower 24-48 hours after the procedure or as told by your health care provider.  If the catheter site starts bleeding, lie flat and put pressure on the site. If the bleeding does not stop, get help right away. This is a medical emergency. This information is not intended to replace advice given to you by your health care provider. Make sure you discuss any questions you have with your health care provider. Document Revised: 10/06/2017 Document Reviewed: 09/28/2016 Elsevier Patient Education  2020 [employer]  

## 2020-06-12 NOTE — Progress Notes (Signed)
Site area: left groin fa sheath pulled by Caren Griffins Site Prior to Removal:  Level 0 Pressure Applied For: 20 minutes Manual:   yes Patient Status During Pull:  stable Post Pull Site:  Level 0 Post Pull Instructions Given:  yes Post Pull Pulses Present: left pt dopplered Dressing Applied:  Gauze and tegaderm Bedrest begins @ 1410 Comments:

## 2020-06-12 NOTE — Op Note (Signed)
Procedure: Abdominal aortogram with bilateral lower extremity runoff, third order catheterization right superficial femoral artery.  Preoperative diagnosis: Claudication  Postoperative diagnosis: Same  Anesthesia: Local  Operative findings: 1.  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  No significant aortoiliac superficial femoral or popliteal disease  3.  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  Left leg occlusion posterior tibial artery with diminutive peroneal artery and primary outflow through the anterior tibial artery  Operative details: After obtaining informed consent patient was taken to the [place] lab.  The patient was placed in supine position angio table.  Both groins were prepped and draped in usual sterile fashion.  Local anesthesia was infiltrated of the left common femoral artery.  Ultrasound used to identify left common femoral artery and femoral bifurcation.  Introducer needle was then used to cannulate the left common femoral artery and a 3 5 versa core wire threaded in the abdominal aorta under fluoroscopic guidance.  Next a 5 French sheath placed over the guidewire and left common femoral artery.  This was thoroughly flushed with heparinized saline.  5 French pigtail catheter was advanced over the guidewire up to the abdominal aorta.  Abdominal aortogram was obtained in AP projection.  The infrarenal abdominal aorta left and right common external and internal iliac arteries are widely patent.  There is also a transplanted pancreas and kidney in the right common iliac and the anastomoses to these are both widely patent.  Next bilateral lower extremity runoff views were obtained through the pigtail catheter.  Bilaterally the left common femoral profundofemoral superficial femoral-popliteal arteries are all widely patent.  Due to some motion artifact and  contrast dilution the tibial vessels were not initially well visualized.  Therefore the pigtail catheter was removed and a 5 [place] crossover catheter used to selectively catheterize the right common iliac followed by advancing an 035 angled Glidewire all the way down to the mid right superficial femoral artery.  Right lower extremity runoff views were then obtained.  Again the right popliteal artery is patent.  The right anterior tibial artery occludes in its mid segment.  The peroneal artery is patent over its full course and gives off a collateral branch that reconstitutes the distal anterior tibial and dorsalis pedis artery.  There is also a collateral branch that gives off a very diseased posterior tibial artery at the level of the ankle which gives off and reconstitutes the plantar arch.  The posterior tibial artery is occluded in its mid segment.  At this point the straight catheter was removed and contrast angiogram was performed of the left lower extremity through the sheath.  The left lower extremity the below-knee popliteal artery is patent.  The anterior tibial artery is patent and the dominant runoff vessel to the left foot.  The peroneal artery is patent but small.  The posterior tibial artery is occluded.  At this point the procedure was concluded.  The 5 French sheath was left in place to be pulled the holding area.  Operative management: The patient has bilateral tibial disease but has adequate perfusion to both feet currently.  I would not consider an intervention for tibial disease for claudication symptoms alone and would reserve this for a limb threatening situation which she currently does not have.  Patient will continue to be medically managed with maximal medical therapy keeping his glucose and check statin aspirin and Pletal.  Consideration should also be given for other possible etiologies for his leg pain.  We will see the patient back in follow-up in 1 year in our APP clinic with  repeat ABIs.  [name], MD [facility] Office: [phone]

## 2020-06-15 ENCOUNTER — Encounter (HOSPITAL_COMMUNITY): Payer: Self-pay | Admitting: Vascular Surgery

## 2020-06-15 DIAGNOSIS — G629 Polyneuropathy, unspecified: Secondary | ICD-10-CM | POA: Diagnosis not present

## 2020-06-15 DIAGNOSIS — Z48288 Encounter for aftercare following multiple organ transplant: Secondary | ICD-10-CM | POA: Diagnosis not present

## 2020-06-15 DIAGNOSIS — Z9483 Pancreas transplant status: Secondary | ICD-10-CM | POA: Diagnosis not present

## 2020-06-15 DIAGNOSIS — D849 Immunodeficiency, unspecified: Secondary | ICD-10-CM | POA: Diagnosis not present

## 2020-06-15 DIAGNOSIS — Z940 Kidney transplant status: Secondary | ICD-10-CM | POA: Diagnosis not present

## 2020-06-15 DIAGNOSIS — Z79899 Other long term (current) drug therapy: Secondary | ICD-10-CM | POA: Diagnosis not present

## 2020-06-15 MED FILL — GABAPENTIN 300 MG CAPSULE: 300 | 30 days supply | Qty: 270 | Fill #0

## 2020-06-15 MED FILL — DULoxetine HCL 30 MG CPEP: 30 | 30 days supply | Qty: 90 | Fill #0

## 2020-06-16 MED FILL — MYCOPHENOLATE SODIUM 180 MG: 180 | 30 days supply | Qty: 120 | Fill #5

## 2020-06-16 MED FILL — PENTOXIFYLLINE 400 MG TAB S: 400 | 30 days supply | Qty: 60 | Fill #2

## 2020-06-17 DIAGNOSIS — G629 Polyneuropathy, unspecified: Secondary | ICD-10-CM | POA: Diagnosis not present

## 2020-06-17 DIAGNOSIS — G8929 Other chronic pain: Secondary | ICD-10-CM | POA: Diagnosis not present

## 2020-06-17 DIAGNOSIS — M5440 Lumbago with sciatica, unspecified side: Secondary | ICD-10-CM | POA: Diagnosis not present

## 2020-06-17 DIAGNOSIS — M6281 Muscle weakness (generalized): Secondary | ICD-10-CM | POA: Diagnosis not present

## 2020-06-17 DIAGNOSIS — Z7409 Other reduced mobility: Secondary | ICD-10-CM | POA: Diagnosis not present

## 2020-06-17 MED FILL — METFORMIN HCL ER 750 MG TB2: 750 | 30 days supply | Qty: 30 | Fill #0

## 2020-06-20 MED FILL — SULFAMETHOXAZOLE-TMP SS TAB: 400-80 | 28 days supply | Qty: 12 | Fill #0

## 2020-06-23 ENCOUNTER — Telehealth: Payer: Self-pay | Admitting: Neurology

## 2020-06-23 ENCOUNTER — Encounter: Payer: Self-pay | Admitting: Neurology

## 2020-06-23 ENCOUNTER — Ambulatory Visit (INDEPENDENT_AMBULATORY_CARE_PROVIDER_SITE_OTHER): Payer: Medicare Other | Admitting: Neurology

## 2020-06-23 VITALS — BP 128/85 | HR 74 | Ht 70.0 in | Wt 206.0 lb

## 2020-06-23 DIAGNOSIS — G8929 Other chronic pain: Secondary | ICD-10-CM | POA: Diagnosis not present

## 2020-06-23 DIAGNOSIS — Z7409 Other reduced mobility: Secondary | ICD-10-CM | POA: Diagnosis not present

## 2020-06-23 DIAGNOSIS — I779 Disorder of arteries and arterioles, unspecified: Secondary | ICD-10-CM | POA: Diagnosis not present

## 2020-06-23 DIAGNOSIS — M62549 Muscle wasting and atrophy, not elsewhere classified, unspecified hand: Secondary | ICD-10-CM

## 2020-06-23 DIAGNOSIS — M5440 Lumbago with sciatica, unspecified side: Secondary | ICD-10-CM | POA: Diagnosis not present

## 2020-06-23 DIAGNOSIS — R296 Repeated falls: Secondary | ICD-10-CM

## 2020-06-23 DIAGNOSIS — G959 Disease of spinal cord, unspecified: Secondary | ICD-10-CM | POA: Diagnosis not present

## 2020-06-23 DIAGNOSIS — E1042 Type 1 diabetes mellitus with diabetic polyneuropathy: Secondary | ICD-10-CM | POA: Diagnosis not present

## 2020-06-23 DIAGNOSIS — R200 Anesthesia of skin: Secondary | ICD-10-CM | POA: Diagnosis not present

## 2020-06-23 DIAGNOSIS — M6281 Muscle weakness (generalized): Secondary | ICD-10-CM | POA: Diagnosis not present

## 2020-06-23 DIAGNOSIS — G629 Polyneuropathy, unspecified: Secondary | ICD-10-CM | POA: Diagnosis not present

## 2020-06-23 MED FILL — TACROLIMUS 1 MG CAPSULE: 1 | 30 days supply | Qty: 240 | Fill #0

## 2020-06-23 NOTE — Telephone Encounter (Signed)
Medicaer[facility]cone UMR Auth: NPR order sent to GI. No auth they will reach out to the patient to schedule.

## 2020-06-23 NOTE — Progress Notes (Signed)
[facility]  PATIENT: Gregory Allen DOB: February 18, 1971  REFERRING DOCTOR OR PCP: Velna Hatchet SOURCE: Note from primary care, laboratory and imaging reports, MRI and CT images personally reviewed.  _________________________________   HISTORICAL  CHIEF COMPLAINT:  Chief Complaint  Patient presents with  . New Patient (Initial Visit)    RM 12. Paper referral from Dr. Ardeth Perfect for falls, DDD.    HISTORY OF PRESENT ILLNESS:  I had the pleasure of seeing patient, Gregory Allen, at [facility] for neurologic consultation  He is a 49 year old man who has had recurrent falls for many years, usually when he feels his numbness is worse.   As examples, with his most recent fall, he stood up and he noted that the right foot was completely numb and he fell.   A second recent fall, he was leaving the shower when he had complete numbness of the right leg and he fell.      At baseline, he has numbness in the right leg and stabbing pain in the left leg.    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    His neuropathy has progressed a lot over the past couple years.  Numbness is in his feet and ankles.    He notes cramps in his legs, left > right.   He has some numbness in his hands.    His hands cramp up with writing and using utensils (right).     He also has lower back pain.  Walking worsens the pain in his legs but sitting worsens the pain in his back.   He tries to do some exercise including walking and riding an exercise bike.     He had a right L5S1 hemilaminectomy in 2010 or 2011 (in [place]).   At that time pain had been exacerbated after a fall  He has PVD in both legs and is on cilostazol and pentoxifylline  For pain, he is on gabapentin 600 mg po tid, methocarbamol and Cymbalta.   Currently, his diabetes is stable.   Last HgbA1c was 6.4 and he has been insulin free since the transplant but needs to be on tacrolimus,  mycophenolate and prednisone.     Vascular risks:   Type 1 IDDM, HTN.   No tobacco use.     Images personally reviewed: MRI of the lumbar spine from 04/23/2020. At L4-L5, there is mild disc bulging and mild facet and ligamenta flava hypertrophy causing mild foraminal narrowing but no nerve root compression. At L5-S1, there is prior right hemilaminectomy. There is residual disc protrusion, more towards the right and increased epidural fat. There is moderately severe right foraminal narrowing that could affect the right L5 nerve root and moderate lateral recess stenosis that could affect the right S1 nerve root. There is mild foraminal and lateral recess narrowing on the left that should not affect those nerve roots.  CT scan of the head 12/21/2015 showed 1 small focus of hypoattenuation in the medial right frontal lobe that could represent a remote focus of ischemic change. There were no acute findings.  CMP 06/15/2020 showed glucose 167 and creatinine 1.08.  Hemoglobin A1c 05/19/2020 equals 6.4.  REVIEW OF SYSTEMS: Constitutional: No fevers, chills, sweats, or change in appetite Eyes: No visual changes, double vision, eye pain Ear, nose and throat: No hearing loss, ear pain, nasal congestion, sore throat Cardiovascular: No chest pain, palpitations Respiratory: No shortness of breath at rest or with exertion.  No wheezes GastrointestinaI: No nausea, vomiting, diarrhea, abdominal pain, fecal incontinence Genitourinary: No dysuria, urinary retention or frequency.  No nocturia.  Creatinine has been elevated at times. Musculoskeletal:As above  integumentary: No rash, pruritus, skin lesions Neurological: as above Psychiatric: No depression at this time.  No anxiety Endocrine: No palpitations, diaphoresis, change in appetite, change in weigh or increased thirst.  As above Hematologic/Lymphatic: No anemia, purpura, petechiae. Allergic/Immunologic: No itchy/runny eyes, nasal congestion, recent  allergic reactions, rashes  ALLERGIES: Allergies  Allergen Reactions  . Shellfish-Derived Products Itching    "throat starts itching" pt states "he is ok with iodine"    HOME MEDICATIONS:  Current Outpatient Medications:  .  acetaminophen (TYLENOL) 500 MG tablet, Take 1,000 mg by mouth every 6 (six) hours as needed for moderate pain or headache., Disp: , Rfl:  .  amLODipine (NORVASC) 10 MG tablet, Take 10 mg by mouth daily. , Disp: , Rfl:  .  aspirin EC 81 MG tablet, Take 81 mg by mouth daily. , Disp: , Rfl:  .  Capsaicin 0.1 % CREA, Apply 1 application topically daily as needed (pain)., Disp: , Rfl:  .  carvedilol (COREG) 12.5 MG tablet, Take 12.5 mg by mouth 2 (two) times daily with a meal., Disp: , Rfl:  .  cilostazol (PLETAL) 100 MG tablet, Take 1 tablet (100 mg total) by mouth 2 (two) times daily before a meal. (Patient not taking: Reported on 06/08/2020), Disp: 60 tablet, Rfl: 11 .  DULoxetine (CYMBALTA) 30 MG capsule, Take 30 mg by mouth 2 (two) times daily. , Disp: , Rfl:  .  gabapentin (NEURONTIN) 300 MG capsule, Take 600 mg by mouth 2 (two) times daily., Disp: , Rfl:  .  Magnesium 400 MG TABS, Take 400 mg by mouth daily., Disp: , Rfl:  .  methocarbamol (ROBAXIN) 500 MG tablet, Take 500 mg by mouth 2 (two) times daily. , Disp: , Rfl:  .  mycophenolate (MYFORTIC) 180 MG EC tablet, Take 1 tablet (180 mg total) by mouth 2 (two) times daily. (Patient taking differently: Take 360 mg by mouth 2 (two) times daily. ), Disp: 60 tablet, Rfl: 5 .  pentoxifylline (TRENTAL) 400 MG CR tablet, Take 400 mg by mouth 2 (two) times daily., Disp: , Rfl:  .  predniSONE (DELTASONE) 5 MG tablet, Take 5 mg by mouth 2 (two) times daily., Disp: , Rfl:  .  rosuvastatin (CRESTOR) 5 MG tablet, Take 5 mg by mouth every Monday, Wednesday, and Friday., Disp: , Rfl:  .  sulfamethoxazole-trimethoprim (BACTRIM,SEPTRA) 400-80 MG tablet, Take 1 tablet by mouth every Monday, Wednesday, and Friday. , Disp: , Rfl: 0 .   tacrolimus (PROGRAF) 1 MG capsule, Take 4 mg by mouth 2 (two) times daily. , Disp: , Rfl:   PAST MEDICAL HISTORY: Past Medical History:  Diagnosis Date  . Diabetes mellitus without complication ([place])     PAST SURGICAL HISTORY: Past Surgical History:  Procedure Laterality Date  . ABDOMINAL AORTOGRAM W/LOWER EXTREMITY Bilateral 06/12/2020   Procedure: ABDOMINAL AORTOGRAM W/LOWER EXTREMITY;  Surgeon: Elam Dutch, MD;  Location: [facility];  Service: Cardiovascular;  Laterality: Bilateral;  Bilateral   . KIDNEY TRANSPLANT    . TRANSPLANT PANCREATIC ALLOGRAFT      FAMILY HISTORY: Family History  Problem Relation Age of Onset  . Diabetes Father     SOCIAL HISTORY:  Social History   Socioeconomic History  . Marital status: Married    Spouse name: Not on file  . Number  of children: Not on file  . Years of education: Not on file  . Highest education level: Not on file  Occupational History  . Not on file  Tobacco Use  . Smoking status: Never Smoker  . Smokeless tobacco: Never Used  Vaping Use  . Vaping Use: Never used  Substance and Sexual Activity  . Alcohol use: No    Alcohol/week: 0.0 standard drinks  . Drug use: No  . Sexual activity: Yes  Other Topics Concern  . Not on file  Social History Narrative  . Not on file   Social Determinants of Health   Financial Resource Strain:   . Difficulty of Paying Living Expenses:   Food Insecurity:   . Worried About Charity fundraiser in the Last Year:   . Arboriculturist in the Last Year:   Transportation Needs:   . Film/video editor (Medical):   Marland Kitchen Lack of Transportation (Non-Medical):   Physical Activity:   . Days of Exercise per Week:   . Minutes of Exercise per Session:   Stress:   . Feeling of Stress :   Social Connections:   . Frequency of Communication with Friends and Family:   . Frequency of Social Gatherings with Friends and Family:   . Attends Religious Services:   . Active Member of  Clubs or Organizations:   . Attends Archivist Meetings:   Marland Kitchen Marital Status:   Intimate Partner Violence:   . Fear of Current or Ex-Partner:   . Emotionally Abused:   Marland Kitchen Physically Abused:   . Sexually Abused:      PHYSICAL EXAM  Vitals:   06/23/20 1001  Height: 5\' 10"  (1.778 m)    Body mass index is 29.56 kg/m.   General: The patient is well-developed and well-nourished and in no acute distress  HEENT:  Head is [place]/AT.  Sclera are anicteric.    Neck: No carotid bruits are noted.  The neck is nontender.  Cardiovascular: The heart has a regular rate and rhythm with a normal S1 and S2. There were no murmurs, gallops or rubs.    Skin: Extremities are without rash or edema.   Reduced pedal pulses  Musculoskeletal:  Back is mildly tender  Neurologic Exam  Mental status: The patient is alert and oriented x 3 at the time of the examination. The patient has apparent normal recent and remote memory, with an apparently normal attention span and concentration ability.   Speech is normal.  Cranial nerves: Extraocular movements are full.   Facial strength is normal.  Trapezius and sternocleidomastoid strength is normal. No dysarthria is noted.   No obvious hearing deficits are noted.  Motor:  Muscle bulk is reduced in the ulnar innervated intrinsic hand muscles in hands, right more than left.   Tone is normal. Strength is  4/5 in intrinsic hand muscles and toe extension.   Sensory: Sensory testing shows reduced vibration in fingers, normal pinprick in fingers, very reduced (10-20%) vibration in toes and 50% at ankles, reduced pinprick to above ankles.  Coordination: Cerebellar testing reveals good finger-nose-finger and heel-to-shin bilaterally.  Gait and station: Station is normal.   Gait is normal. Tandem gait is mildly wide. Romberg is borderline.   Reflexes: Deep tendon reflexes are symmetric and 1+ in arms and absent in legs.   Plantar responses are  flexor.     DIAGNOSTIC DATA (LABS, IMAGING, TESTING) - I reviewed patient records, labs, notes, testing and imaging myself where  available.  Lab Results  Component Value Date   WBC 8.2 11/05/2017   HGB 14.6 06/12/2020   HCT 43.0 06/12/2020   MCV 78.5 11/05/2017   PLT 264 11/05/2017      Component Value Date/Time   NA 144 06/12/2020 1010   K 3.5 06/12/2020 1010   CL 103 06/12/2020 1010   CO2 18 (L) 11/05/2017 0755   GLUCOSE 114 (H) 06/12/2020 1010   BUN 14 06/12/2020 1010   CREATININE 0.90 06/12/2020 1010   CALCIUM 8.9 11/05/2017 0755   PROT 7.6 11/05/2017 0755   ALBUMIN 3.9 11/05/2017 0755   AST 24 11/05/2017 0755   ALT 36 11/05/2017 0755   ALKPHOS 58 11/05/2017 0755   BILITOT 0.8 11/05/2017 0755   GFRNONAA 47 (L) 11/05/2017 0755   GFRAA 55 (L) 11/05/2017 0755       ASSESSMENT AND PLAN   Recurrent falls - Plan: MR BRAIN WO CONTRAST  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 Tomko is a 49 year old man with type 1 diabetes status post pancreatic islet transplant.  He has multiple falls that are usually associated with feeling more numb in the legs.  He has several possible causes of the recurrent falls.  The diabetic polyneuropathy appears moderate with numbness at the ankles and hands and likely plays a large as well.  His known lumbar degenerative changes could also explain some numbness and pain in the legs.  I am concerned that he has atrophy in the hand muscles, more than would be expected with moderate diabetic polyneuropathy.  We need to check an MRI of the cervical spine to help determine if there is an intrinsic or extrinsic myelopathy that could  explain the falls as well as the atrophy.  Additionally we will check MRI of the brain as the CT scan from several years ago showed probable ischemic changes.  Extensive chronic microvascular ischemic change could also contribute to the falls.  Will also check NCV/EMG to determine the severity of the diabetic polyneuropathy as well as evaluate possible causes of the hand atrophy (i.e superimposed ulnar neuropathy).  I will see him when he returns for the EMG and he should call sooner if significant new or worsening neurologic symptoms.  Thank you for asking me to see Mr. Quentin Ore.  Please let me know if I can be of further assistance with him or other patients in the future.   Cairo Agostinelli A. Felecia Shelling, MD, [facility] 8/92/1194, 17:40 AM Certified in Neurology, Clinical Neurophysiology, Sleep Medicine and Neuroimaging  [facility] [address] [place], [place] [address] [phone]

## 2020-06-25 DIAGNOSIS — G8929 Other chronic pain: Secondary | ICD-10-CM | POA: Diagnosis not present

## 2020-06-25 DIAGNOSIS — M6281 Muscle weakness (generalized): Secondary | ICD-10-CM | POA: Diagnosis not present

## 2020-06-25 DIAGNOSIS — G629 Polyneuropathy, unspecified: Secondary | ICD-10-CM | POA: Diagnosis not present

## 2020-06-25 DIAGNOSIS — Z7409 Other reduced mobility: Secondary | ICD-10-CM | POA: Diagnosis not present

## 2020-06-25 DIAGNOSIS — M5440 Lumbago with sciatica, unspecified side: Secondary | ICD-10-CM | POA: Diagnosis not present

## 2020-07-03 ENCOUNTER — Ambulatory Visit
Admission: RE | Admit: 2020-07-03 | Discharge: 2020-07-03 | Disposition: A | Discharge status: Home / Self Care | Payer: 59 | Source: Ambulatory Visit | Attending: Neurology | Admitting: Neurology

## 2020-07-03 ENCOUNTER — Other Ambulatory Visit: Payer: Self-pay

## 2020-07-03 ENCOUNTER — Other Ambulatory Visit (HOSPITAL_COMMUNITY): Payer: Self-pay | Admitting: Internal Medicine

## 2020-07-03 DIAGNOSIS — G959 Disease of spinal cord, unspecified: Secondary | ICD-10-CM

## 2020-07-03 DIAGNOSIS — B078 Other viral warts: Secondary | ICD-10-CM | POA: Diagnosis not present

## 2020-07-03 DIAGNOSIS — M5440 Lumbago with sciatica, unspecified side: Secondary | ICD-10-CM | POA: Diagnosis not present

## 2020-07-03 DIAGNOSIS — R200 Anesthesia of skin: Secondary | ICD-10-CM

## 2020-07-03 DIAGNOSIS — Z7409 Other reduced mobility: Secondary | ICD-10-CM | POA: Diagnosis not present

## 2020-07-03 DIAGNOSIS — M6281 Muscle weakness (generalized): Secondary | ICD-10-CM | POA: Diagnosis not present

## 2020-07-03 DIAGNOSIS — R296 Repeated falls: Secondary | ICD-10-CM

## 2020-07-03 DIAGNOSIS — G8929 Other chronic pain: Secondary | ICD-10-CM | POA: Diagnosis not present

## 2020-07-03 DIAGNOSIS — G629 Polyneuropathy, unspecified: Secondary | ICD-10-CM | POA: Diagnosis not present

## 2020-07-03 MED FILL — ROSUVASTATIN CALCIUM 5 MG T: 5 | 84 days supply | Qty: 36 | Fill #1

## 2020-07-03 MED FILL — METHOCARBAMOL 500 MG TABS: 500 | 30 days supply | Qty: 60 | Fill #0

## 2020-07-06 ENCOUNTER — Telehealth: Payer: Self-pay | Admitting: *Deleted

## 2020-07-06 NOTE — Telephone Encounter (Signed)
Called and spoke with pt. Relayed results per Dr. Felecia Shelling note. He verbalized understanding. Confirmed he is still taking ASA 81mg  po qd. He will continue this. He no longer smokes. He will call if sx do not get better over time.

## 2020-07-06 NOTE — Telephone Encounter (Signed)
-----   Message from Britt Bottom, MD sent at 07/06/2020  4:42 PM EDT ----- Please let him know that I reviewed the MRI of the brain and cervical spine.  He does have some age-related changes and also some small strokelike changes likely from the hypertension.  He has some disc and arthritic changes in the cervical spine but nothing that would affect leg strength  I see he is on aspirin.  Please confirm that he is taking that.  Also if he is smoking he needs to stop.  If symptoms do not get any better he should give [place] a call and I would want to reevaluate him and consider also imaging further down the spine

## 2020-07-14 MED FILL — predniSONE 5 MG TABS: 5 | 30 days supply | Qty: 60 | Fill #3

## 2020-07-14 MED FILL — MYCOPHENOLATE SODIUM 180 MG: 180 | 30 days supply | Qty: 120 | Fill #0

## 2020-07-16 MED FILL — METFORMIN HCL ER 750 MG TB2: 750 | 30 days supply | Qty: 30 | Fill #1

## 2020-07-20 DIAGNOSIS — Z792 Long term (current) use of antibiotics: Secondary | ICD-10-CM | POA: Diagnosis not present

## 2020-07-20 DIAGNOSIS — I739 Peripheral vascular disease, unspecified: Secondary | ICD-10-CM | POA: Diagnosis not present

## 2020-07-20 DIAGNOSIS — D849 Immunodeficiency, unspecified: Secondary | ICD-10-CM | POA: Diagnosis not present

## 2020-07-20 DIAGNOSIS — Z5181 Encounter for therapeutic drug level monitoring: Secondary | ICD-10-CM | POA: Diagnosis not present

## 2020-07-20 DIAGNOSIS — Z9483 Pancreas transplant status: Secondary | ICD-10-CM | POA: Diagnosis not present

## 2020-07-20 DIAGNOSIS — Z940 Kidney transplant status: Secondary | ICD-10-CM | POA: Diagnosis not present

## 2020-07-20 DIAGNOSIS — E1042 Type 1 diabetes mellitus with diabetic polyneuropathy: Secondary | ICD-10-CM | POA: Diagnosis not present

## 2020-07-20 DIAGNOSIS — Z4822 Encounter for aftercare following kidney transplant: Secondary | ICD-10-CM | POA: Diagnosis not present

## 2020-07-20 DIAGNOSIS — B348 Other viral infections of unspecified site: Secondary | ICD-10-CM | POA: Diagnosis not present

## 2020-07-20 DIAGNOSIS — Z79899 Other long term (current) drug therapy: Secondary | ICD-10-CM | POA: Diagnosis not present

## 2020-07-20 DIAGNOSIS — I10 Essential (primary) hypertension: Secondary | ICD-10-CM | POA: Diagnosis not present

## 2020-07-20 DIAGNOSIS — Z48288 Encounter for aftercare following multiple organ transplant: Secondary | ICD-10-CM | POA: Diagnosis not present

## 2020-07-20 DIAGNOSIS — B349 Viral infection, unspecified: Secondary | ICD-10-CM | POA: Diagnosis not present

## 2020-07-20 DIAGNOSIS — Z7952 Long term (current) use of systemic steroids: Secondary | ICD-10-CM | POA: Diagnosis not present

## 2020-07-20 MED FILL — LINZESS 145 MCG CAPSULE: 145 | 30 days supply | Qty: 30 | Fill #0

## 2020-07-22 DIAGNOSIS — M25552 Pain in left hip: Secondary | ICD-10-CM | POA: Diagnosis not present

## 2020-07-22 DIAGNOSIS — E1042 Type 1 diabetes mellitus with diabetic polyneuropathy: Secondary | ICD-10-CM | POA: Diagnosis not present

## 2020-07-28 DIAGNOSIS — M545 Low back pain: Secondary | ICD-10-CM | POA: Diagnosis not present

## 2020-07-28 DIAGNOSIS — M5432 Sciatica, left side: Secondary | ICD-10-CM | POA: Diagnosis not present

## 2020-07-28 DIAGNOSIS — I10 Essential (primary) hypertension: Secondary | ICD-10-CM | POA: Diagnosis not present

## 2020-07-28 DIAGNOSIS — E1121 Type 2 diabetes mellitus with diabetic nephropathy: Secondary | ICD-10-CM | POA: Diagnosis not present

## 2020-07-30 MED FILL — TACROLIMUS 1 MG CAPSULE: 1 | 30 days supply | Qty: 240 | Fill #1

## 2020-08-10 ENCOUNTER — Other Ambulatory Visit (HOSPITAL_COMMUNITY): Payer: Self-pay | Admitting: Internal Medicine

## 2020-08-10 MED FILL — SULFAMETHOXAZOLE-TMP SS TAB: 400-80 | 84 days supply | Qty: 36 | Fill #0

## 2020-08-10 MED FILL — AMLODIPINE BESYLATE 10 MG T: 10 | 90 days supply | Qty: 90 | Fill #0

## 2020-08-11 ENCOUNTER — Encounter: Payer: Medicare Other | Admitting: Neurology

## 2020-08-11 ENCOUNTER — Ambulatory Visit (HOSPITAL_COMMUNITY)
Admission: RE | Admit: 2020-08-11 | Discharge: 2020-08-11 | Disposition: A | Discharge status: Home / Self Care | Payer: 59 | Source: Ambulatory Visit | Attending: Internal Medicine | Admitting: Internal Medicine

## 2020-08-11 ENCOUNTER — Other Ambulatory Visit: Payer: Self-pay

## 2020-08-11 ENCOUNTER — Other Ambulatory Visit (HOSPITAL_COMMUNITY): Payer: Self-pay | Admitting: Internal Medicine

## 2020-08-11 DIAGNOSIS — R238 Other skin changes: Secondary | ICD-10-CM | POA: Diagnosis not present

## 2020-08-11 DIAGNOSIS — M25473 Effusion, unspecified ankle: Secondary | ICD-10-CM | POA: Diagnosis not present

## 2020-08-12 ENCOUNTER — Other Ambulatory Visit (HOSPITAL_COMMUNITY): Payer: Self-pay | Admitting: Internal Medicine

## 2020-08-12 DIAGNOSIS — B078 Other viral warts: Secondary | ICD-10-CM | POA: Diagnosis not present

## 2020-08-12 DIAGNOSIS — B079 Viral wart, unspecified: Secondary | ICD-10-CM | POA: Diagnosis not present

## 2020-08-12 MED FILL — FLUARIX QUADRIVALENT 0.5 ML: 0.5 | 1 days supply | Qty: 1 | Fill #0

## 2020-08-13 DIAGNOSIS — Z7409 Other reduced mobility: Secondary | ICD-10-CM | POA: Diagnosis not present

## 2020-08-13 DIAGNOSIS — G629 Polyneuropathy, unspecified: Secondary | ICD-10-CM | POA: Diagnosis not present

## 2020-08-13 DIAGNOSIS — M6281 Muscle weakness (generalized): Secondary | ICD-10-CM | POA: Diagnosis not present

## 2020-08-13 DIAGNOSIS — G8929 Other chronic pain: Secondary | ICD-10-CM | POA: Diagnosis not present

## 2020-08-13 DIAGNOSIS — M5440 Lumbago with sciatica, unspecified side: Secondary | ICD-10-CM | POA: Diagnosis not present

## 2020-08-14 IMAGING — DX DG ELBOW COMPLETE 3+V*L*
4 series · 4 of 4 positions shown · non-contrast
Comparison: None.

CLINICAL DATA: Fall, right elbow pain

EXAM:
LEFT ELBOW - COMPLETE 3+ VIEW

[elbow ap]
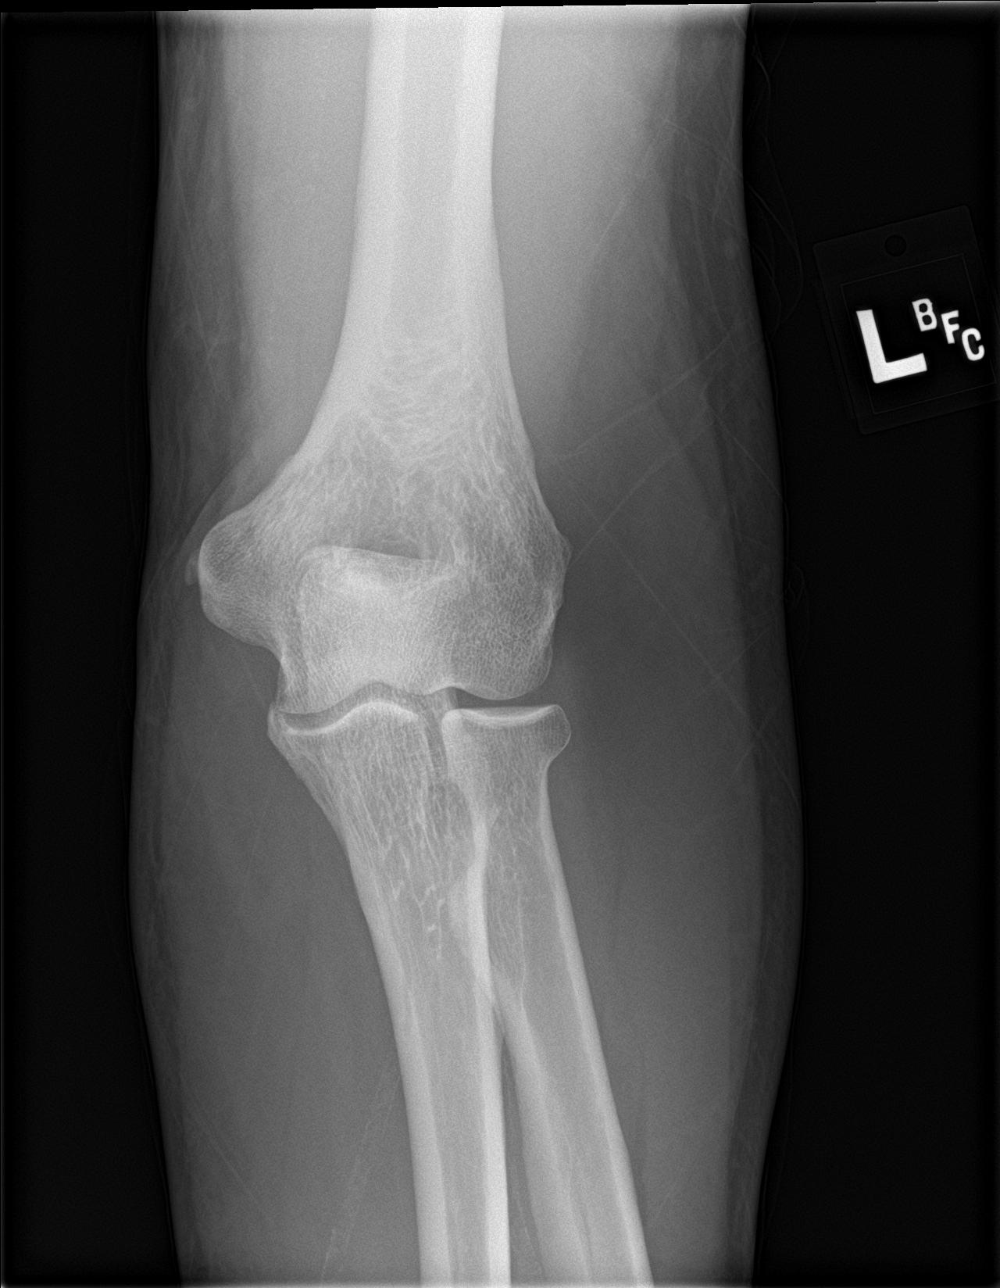

[elbow obl (1 of 2)]
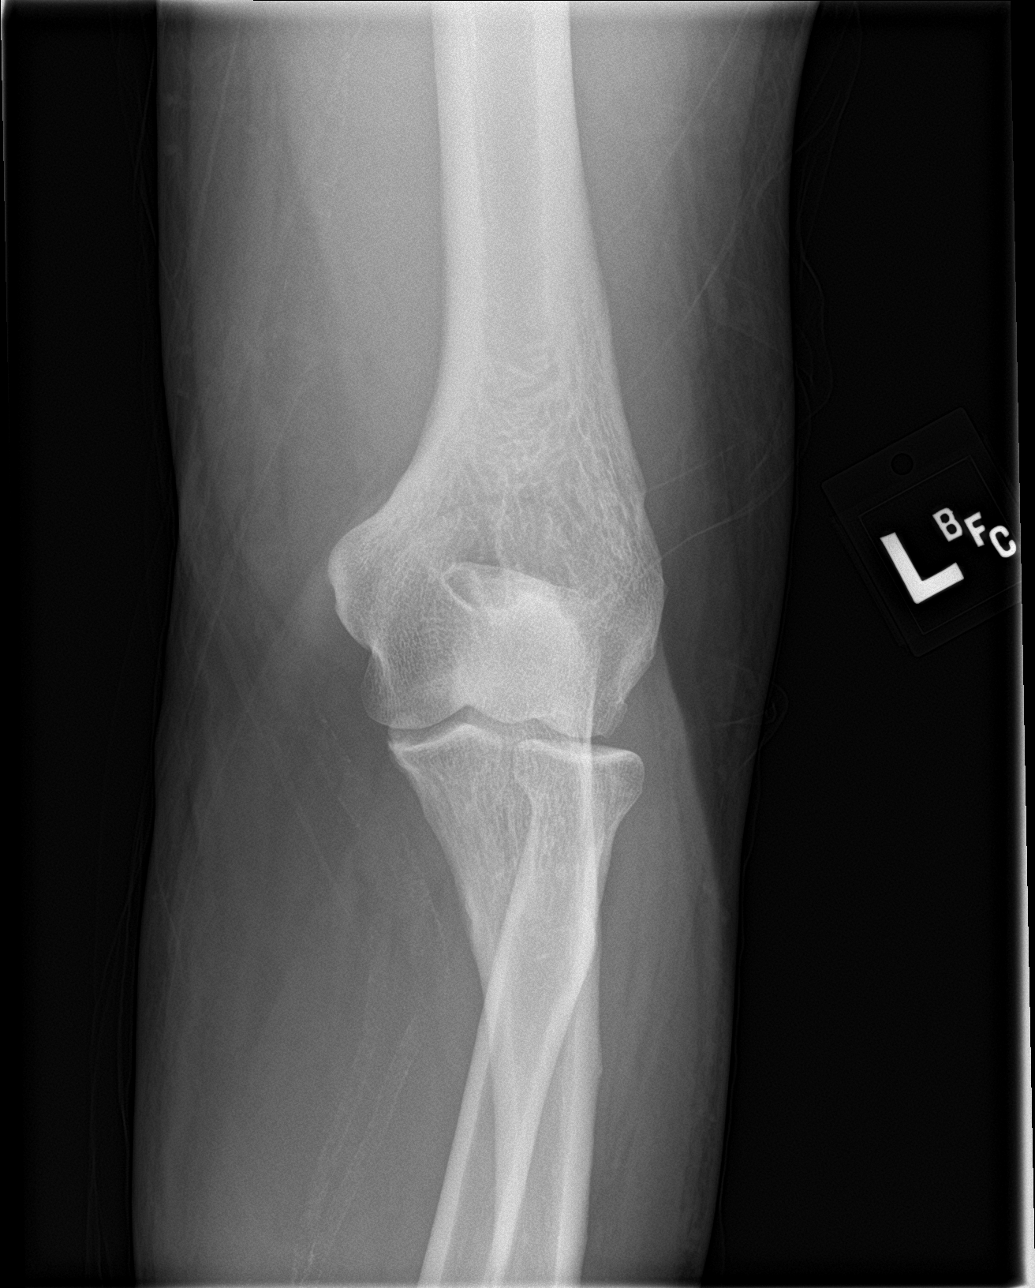

[elbow obl (2 of 2)]
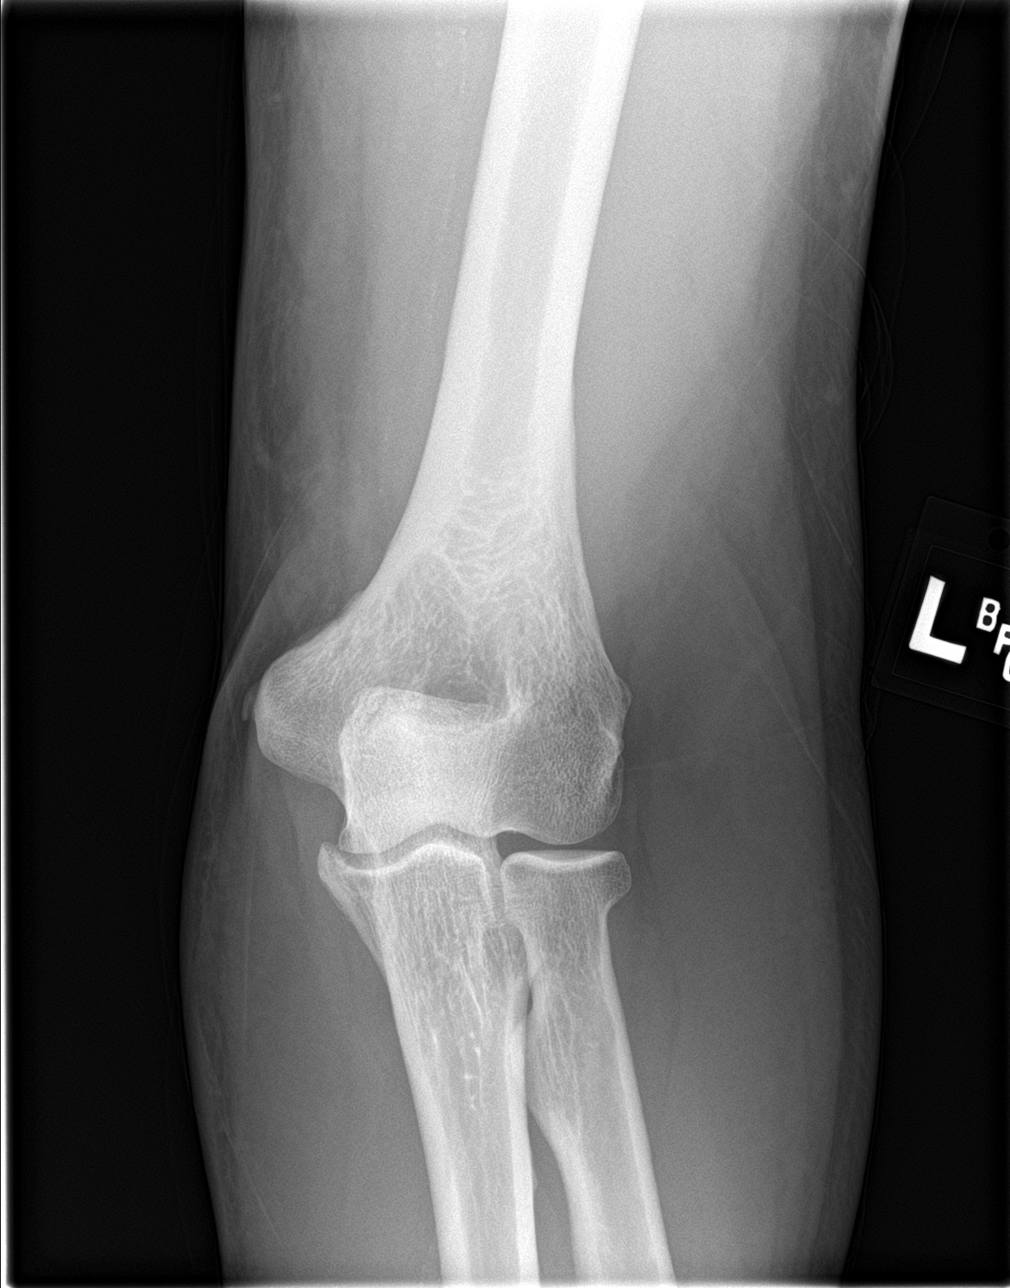

[elbow lat]
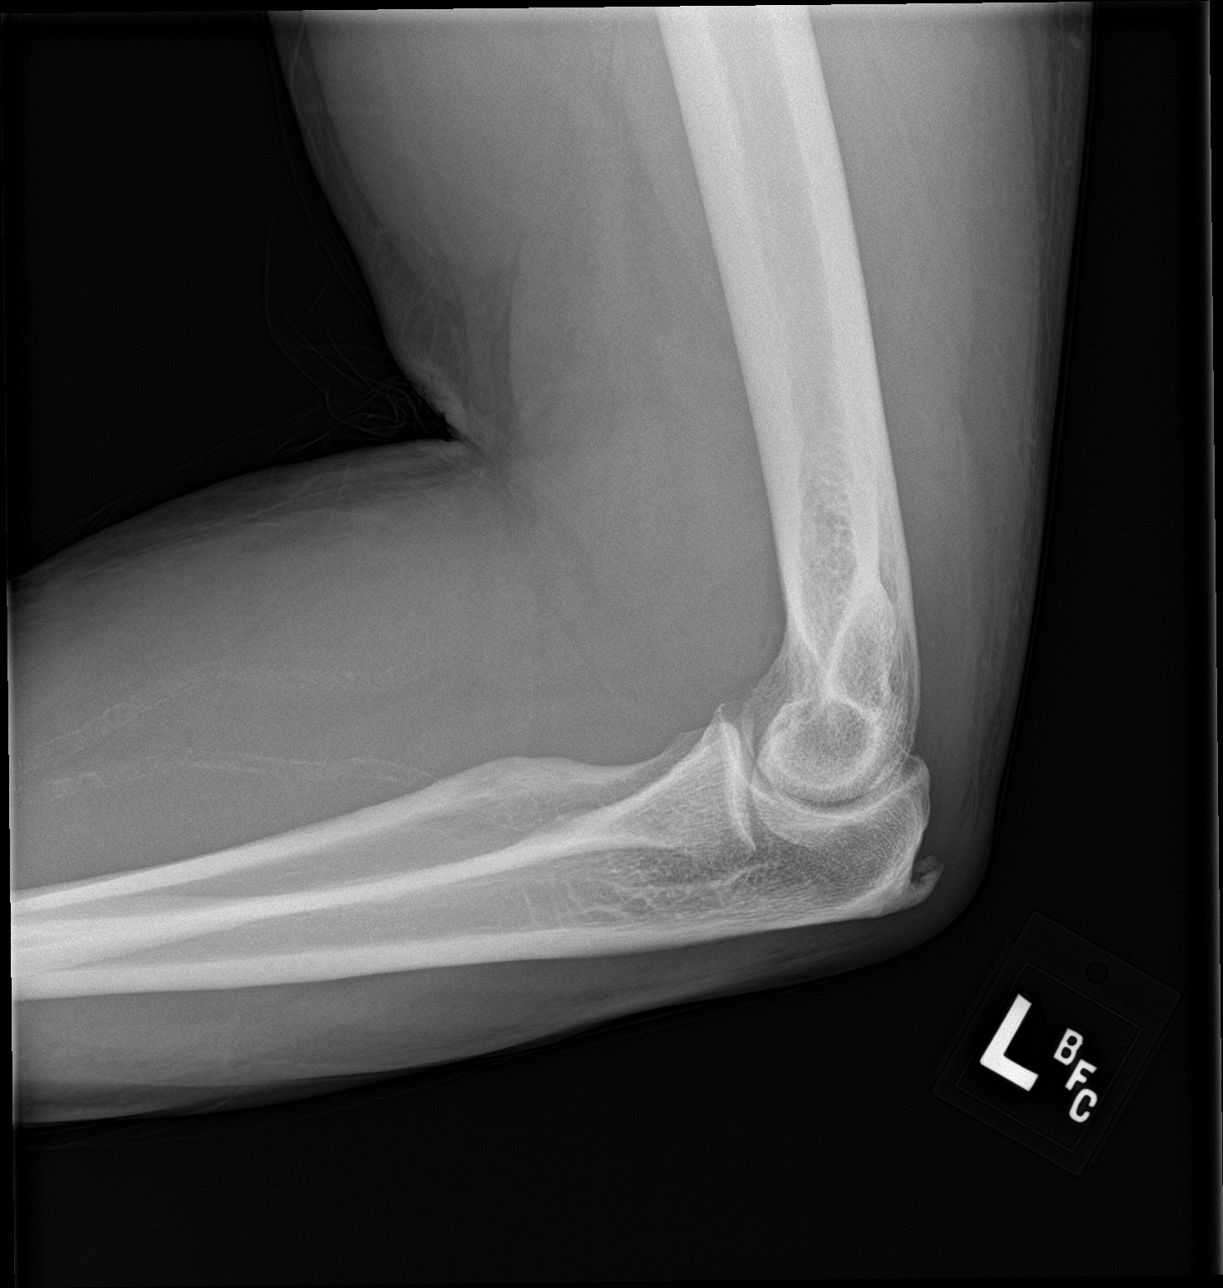

[4 of 4 positions shown; findings below may reference images not displayed]

FINDINGS: There is no evidence of fracture, dislocation, or joint effusion.
There is no evidence of arthropathy or other focal bone abnormality.
Soft tissues are unremarkable.
IMPRESSION: Negative.

## 2020-08-14 IMAGING — DX DG HIP (WITH OR WITHOUT PELVIS) 2-3V*R*
3 series · 3 of 3 positions shown · non-contrast
Comparison: None.

CLINICAL DATA: Fall, right hip pain

EXAM:
DG HIP (WITH OR WITHOUT PELVIS) 2-3V RIGHT

[pelvis ap]
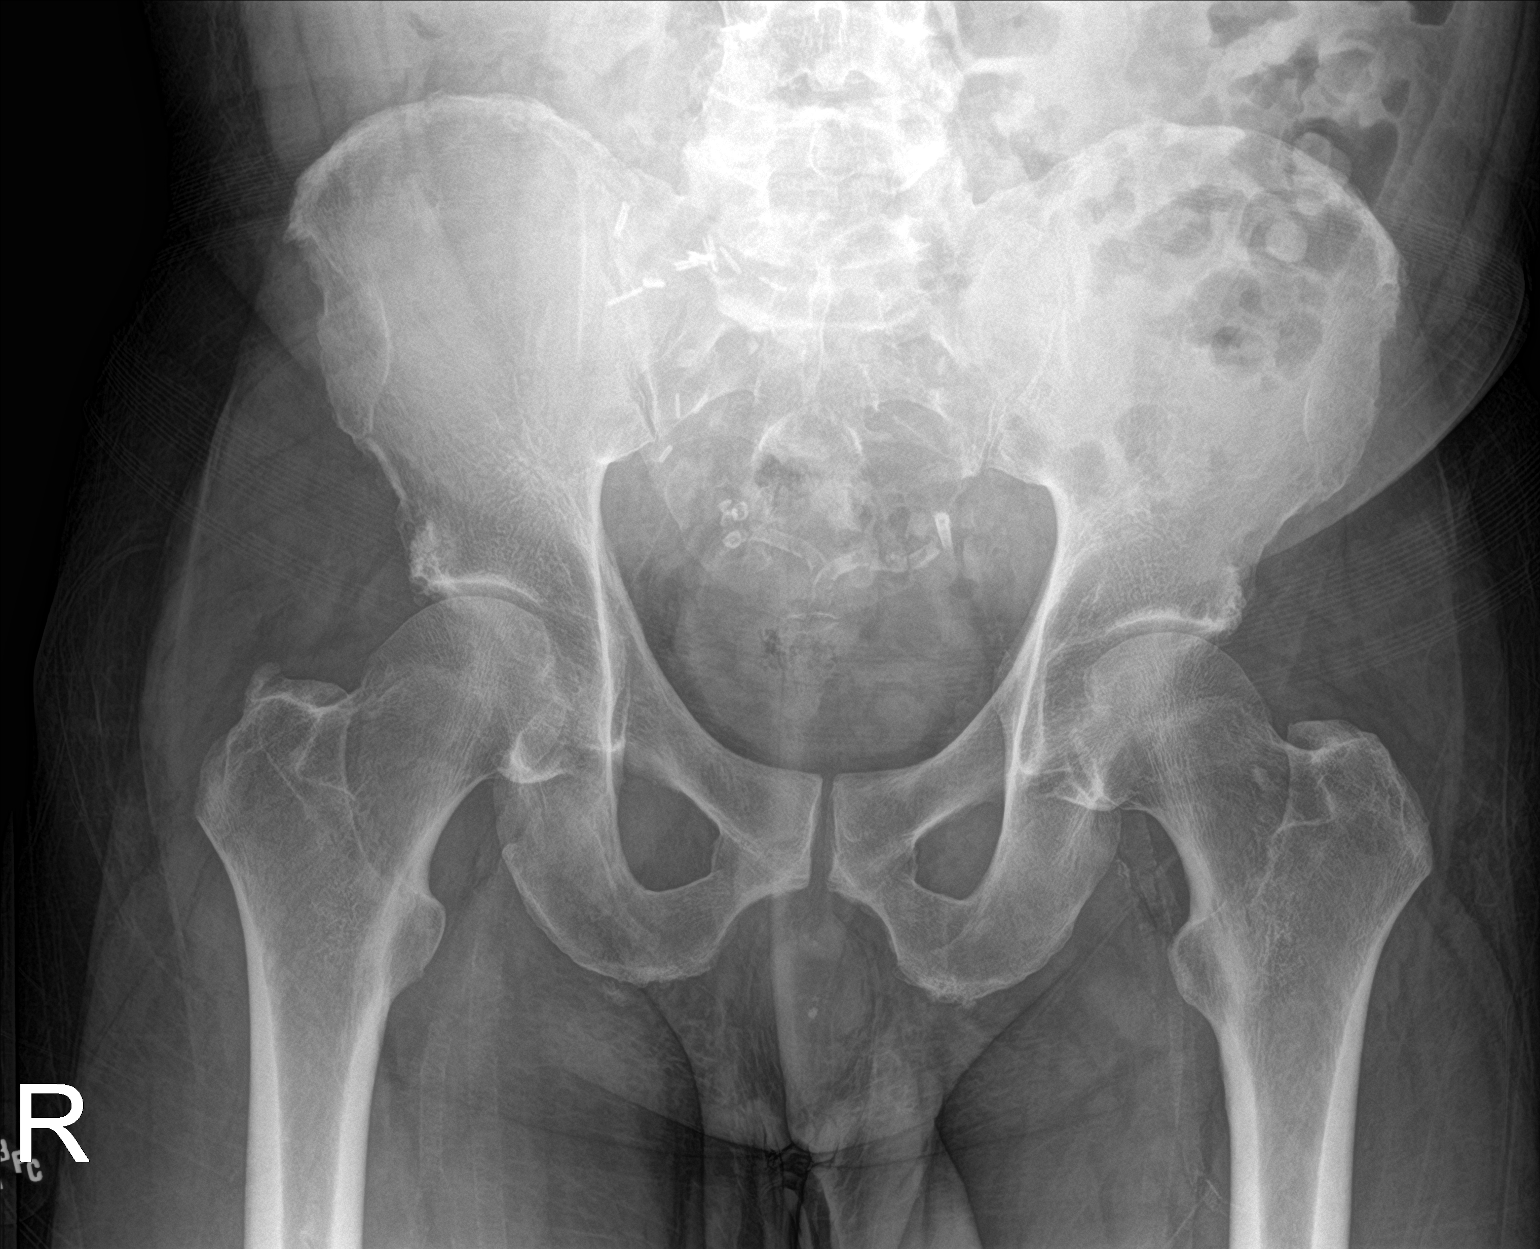

[hip ap]
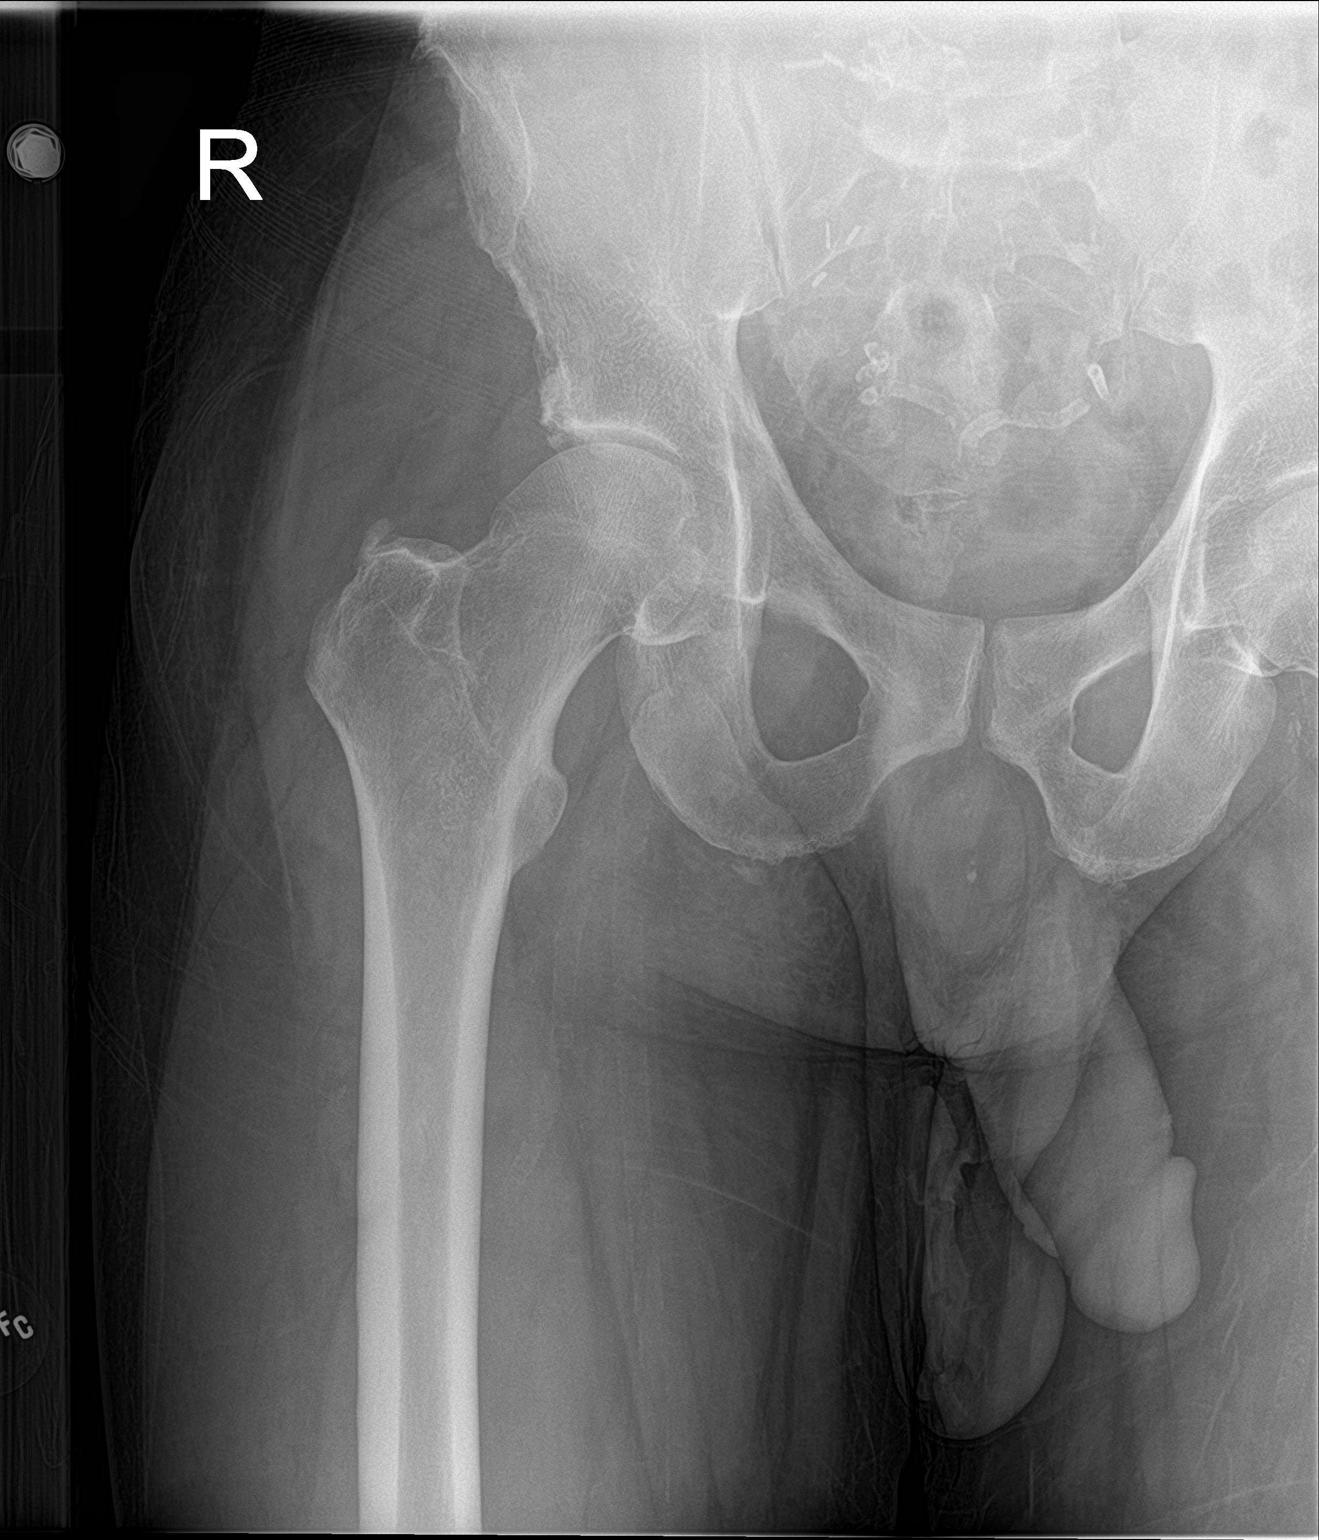

[hip lat]
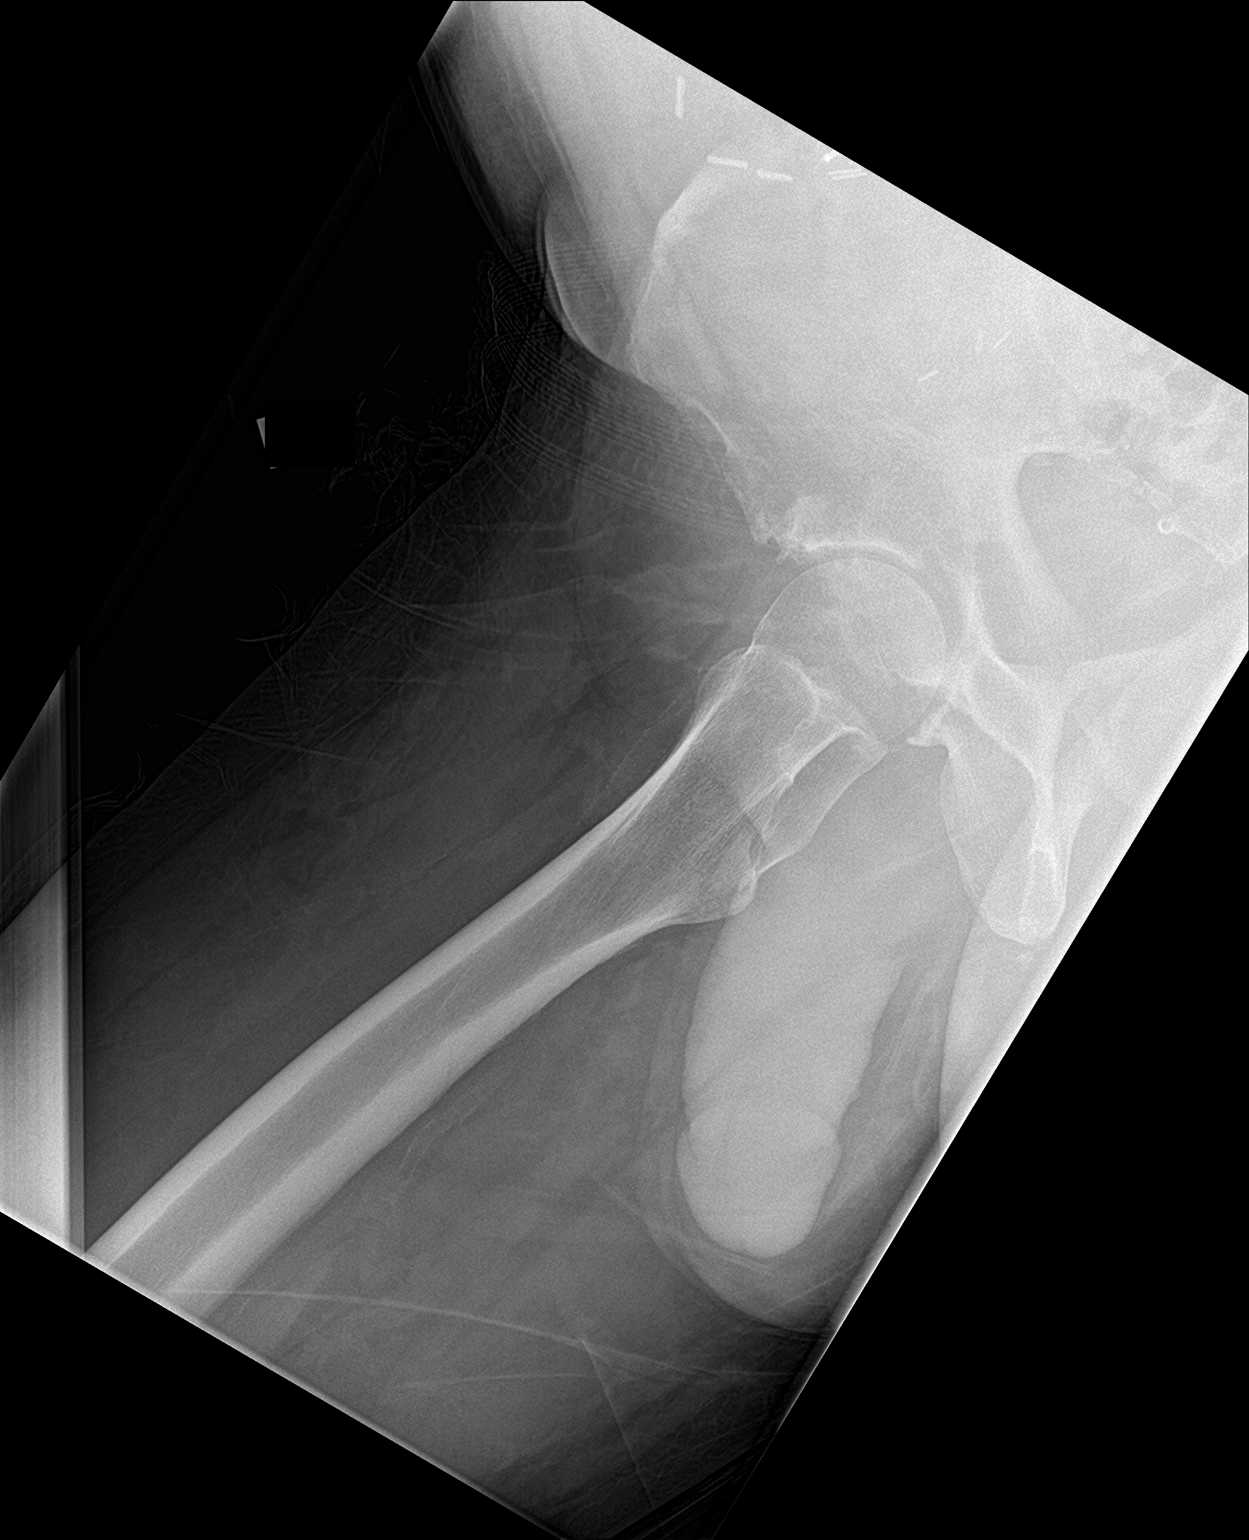

[3 of 3 positions shown; findings below may reference images not displayed]

FINDINGS: Early symmetric degenerative changes in the hips bilaterally. SI
joints symmetric and unremarkable. No acute bony abnormality.
Specifically, no fracture, subluxation, or dislocation.
IMPRESSION: No acute bony abnormality.

## 2020-08-15 IMAGING — CT CT PELVIS W/O CM
2 of 5 series · 15 of 46 positions shown, 18 images · non-contrast
Comparison: Plain films 10/10/2018

CLINICAL DATA: Fall, landed on tailbone. Unable to bear weight due
to pain.

EXAM:
CT PELVIS WITHOUT CONTRAST
TECHNIQUE: Multidetector CT imaging of the pelvis was performed following the
standard protocol without intravenous contrast.

[Series 3: soft tissue · axial · 0.77mm/px · z∈[-455,-235]mm · 12 of 50 slices shown, 15 images]
[im 4/50  soft-tissue]
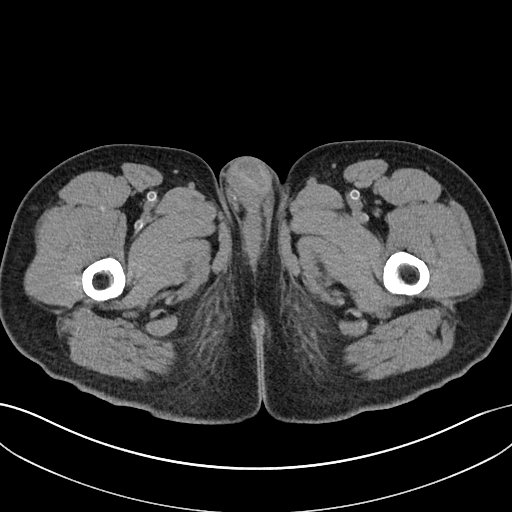
[im 4/50  bone]
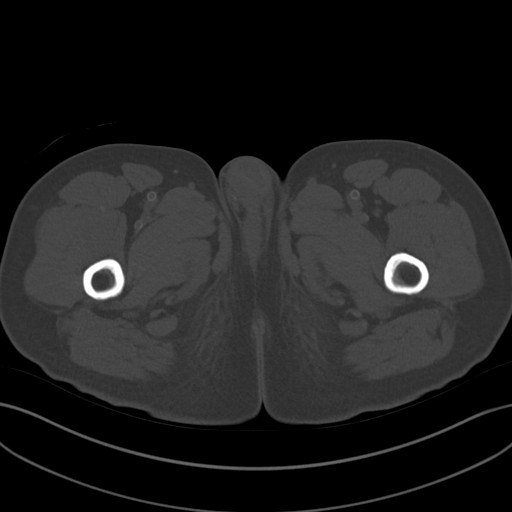
[im 10/50  soft-tissue]
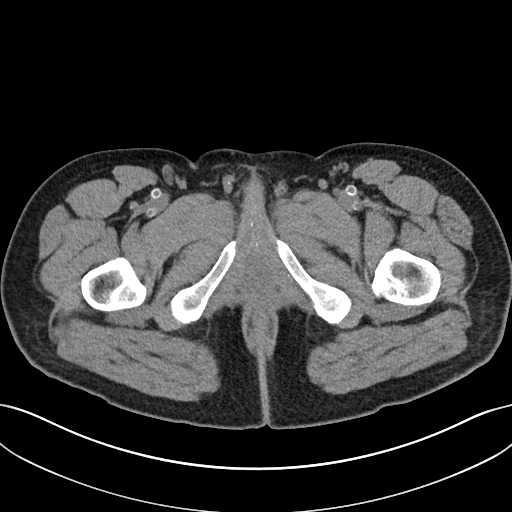
[im 15/50  soft-tissue]
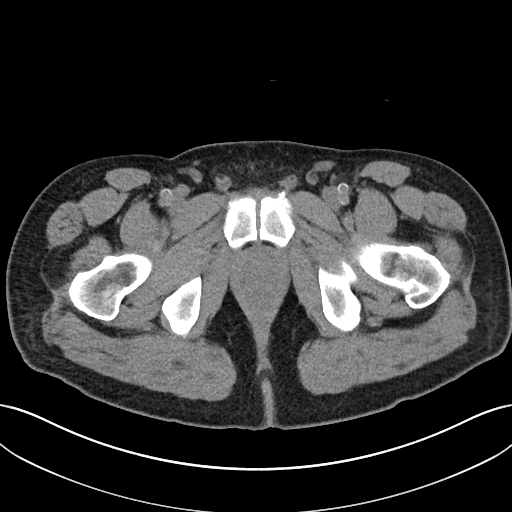
[im 19/50  soft-tissue]
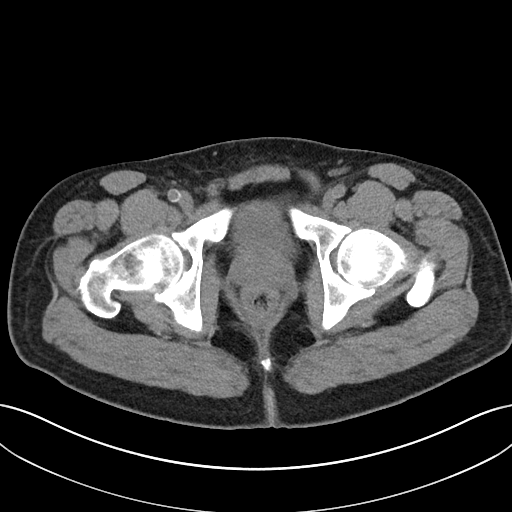
[im 26/50  soft-tissue]
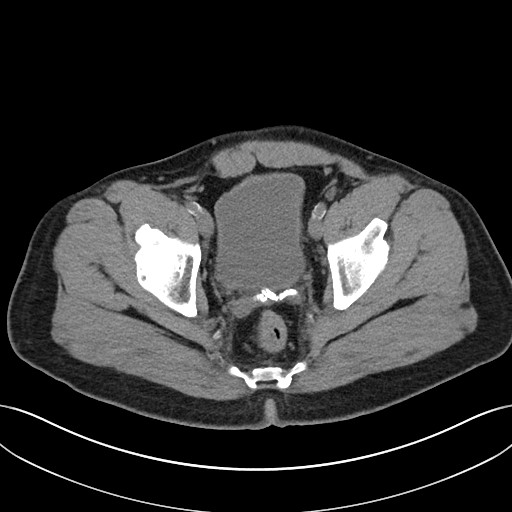
[im 31/50  soft-tissue]
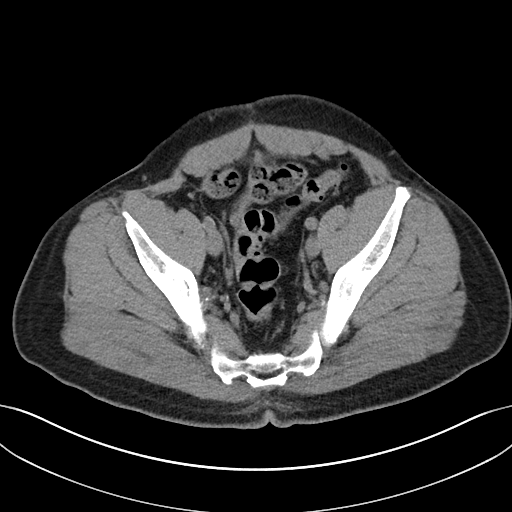
[im 35/50  soft-tissue]
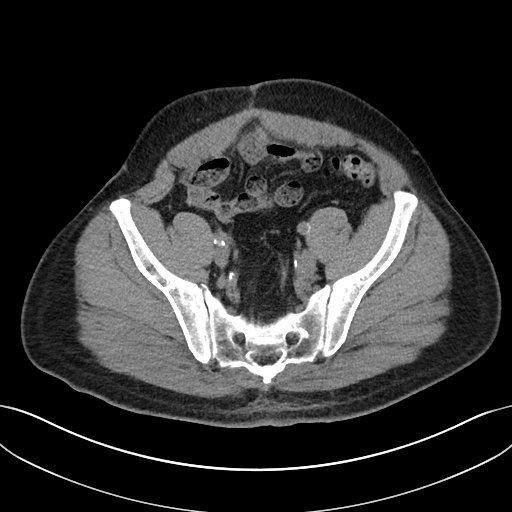
[im 42/50  soft-tissue]
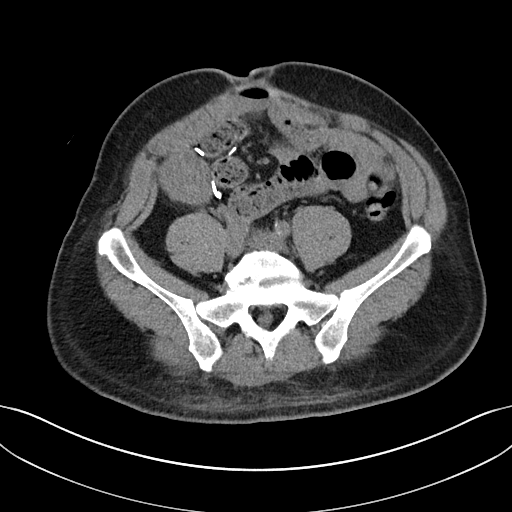
[im 43/50  lung]
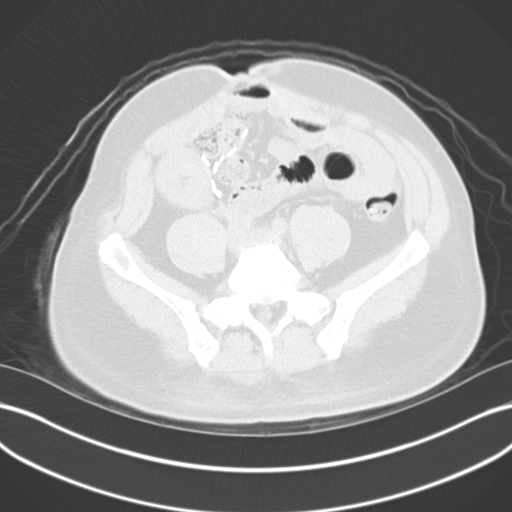
[im 45/50  lung]
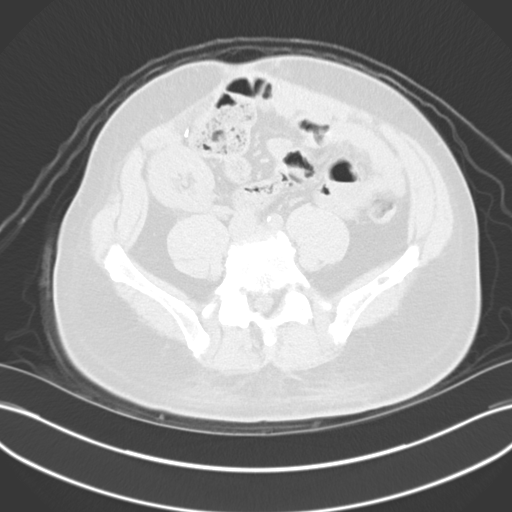
[im 46/50  soft-tissue]
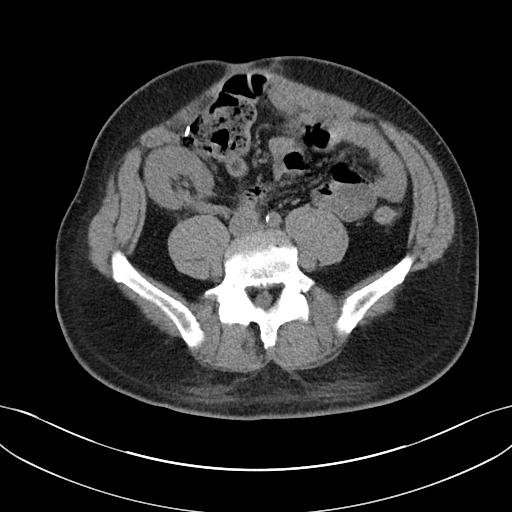
[im 46/50  lung]
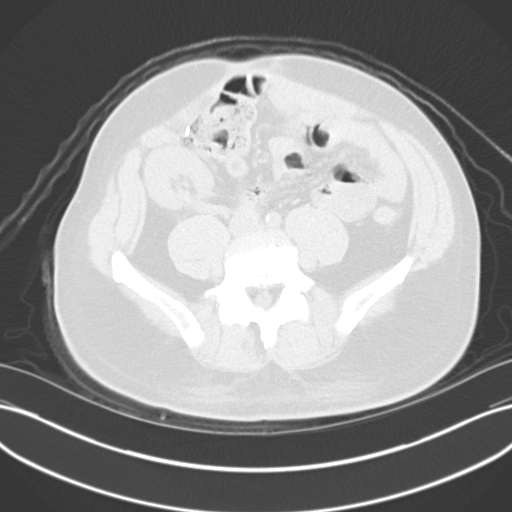
[im 46/50  bone]
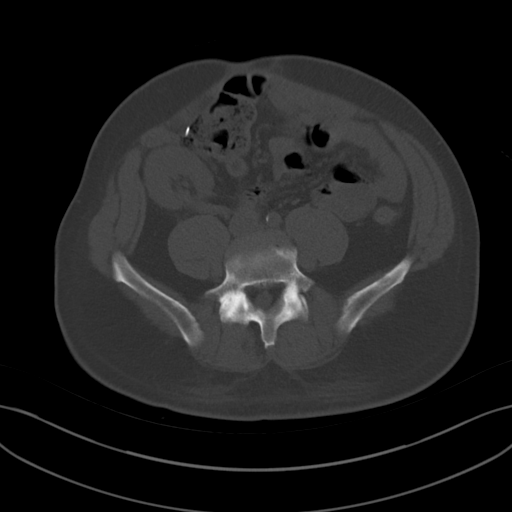
[im 48/50  lung]
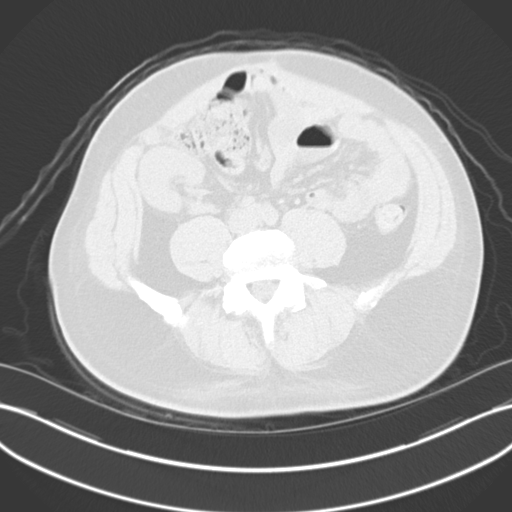

[Series 7: cor st · coronal · 0.51mm/px · 3 of 146 slices shown]
[im 37/146  soft-tissue]
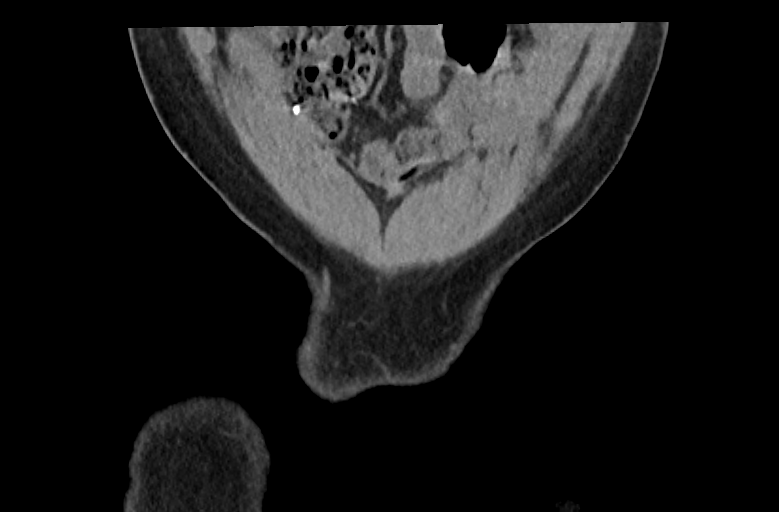
[im 73/146  soft-tissue]
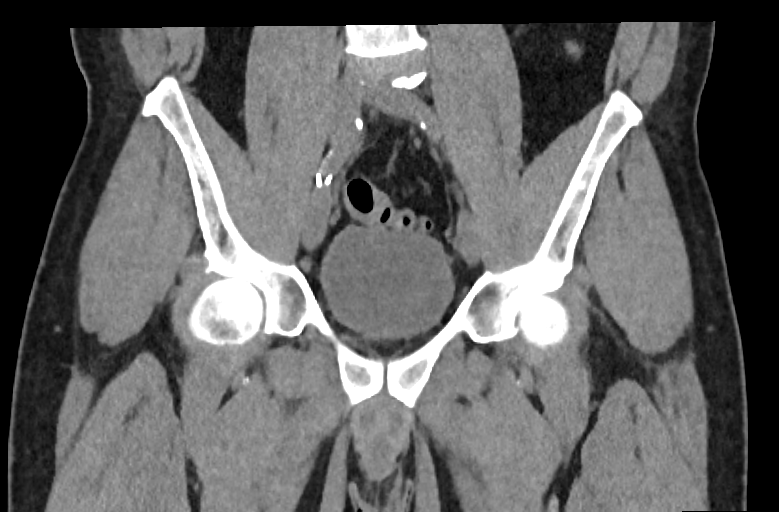
[im 109/146  soft-tissue]
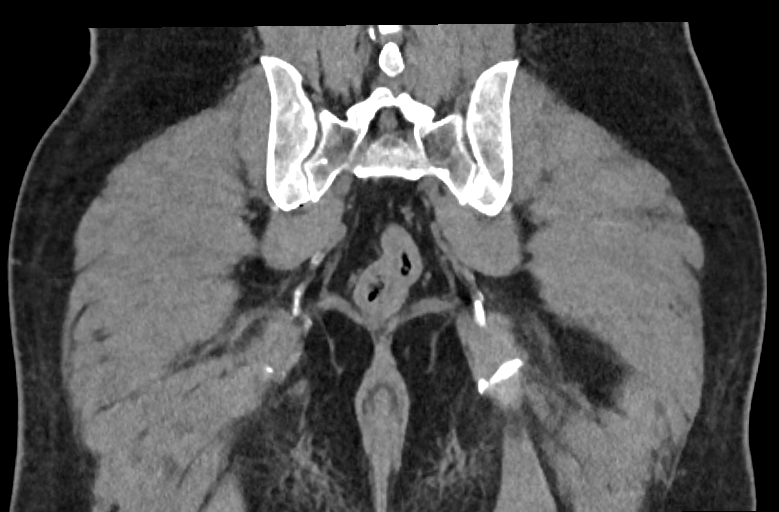

[15 of 46 positions shown; findings below may reference images not displayed]

FINDINGS: Urinary Tract: Probable right lower quadrant renal transplant. No
hydronephrosis. Urinary bladder unremarkable.

Bowel:  Visualized large and small bowel unremarkable.

Vascular/Lymphatic: No aneurysm or adenopathy.

Reproductive:  No visible focal abnormality.

Other:  No free fluid or free air.

Musculoskeletal: No acute bony abnormality. No sacral or coccygeal
fracture. No pelvic or proximal femoral fracture. Degenerative disc
disease at L5-S1. Sclerotic foci denoted with in both iliac bones
and sacrum, nonspecific.
IMPRESSION: No evidence of pelvic fracture.

Scattered sclerotic foci within the pelvic bones. These could
reflect small bone islands. Cannot completely exclude sclerotic
metastases. Recommend correlation with PSA value and any cancer
history.

## 2020-08-17 ENCOUNTER — Other Ambulatory Visit (HOSPITAL_COMMUNITY): Payer: Self-pay | Admitting: Nephrology

## 2020-08-17 DIAGNOSIS — Z5181 Encounter for therapeutic drug level monitoring: Secondary | ICD-10-CM | POA: Diagnosis not present

## 2020-08-17 DIAGNOSIS — Z940 Kidney transplant status: Secondary | ICD-10-CM | POA: Diagnosis not present

## 2020-08-17 DIAGNOSIS — D849 Immunodeficiency, unspecified: Secondary | ICD-10-CM | POA: Diagnosis not present

## 2020-08-17 DIAGNOSIS — Z4822 Encounter for aftercare following kidney transplant: Secondary | ICD-10-CM | POA: Diagnosis not present

## 2020-08-17 DIAGNOSIS — Z79899 Other long term (current) drug therapy: Secondary | ICD-10-CM | POA: Diagnosis not present

## 2020-08-17 MED FILL — CARVEDILOL 12.5 MG TABLET: 12.5 | 90 days supply | Qty: 180 | Fill #1

## 2020-08-17 MED FILL — MYCOPHENOLATE SODIUM 180 MG: 180 | 30 days supply | Qty: 120 | Fill #1

## 2020-08-17 MED FILL — predniSONE 5 MG TABS: 5 | 30 days supply | Qty: 60 | Fill #0

## 2020-08-19 DIAGNOSIS — Z9483 Pancreas transplant status: Secondary | ICD-10-CM | POA: Diagnosis not present

## 2020-08-19 DIAGNOSIS — Z940 Kidney transplant status: Secondary | ICD-10-CM | POA: Diagnosis not present

## 2020-08-19 DIAGNOSIS — D849 Immunodeficiency, unspecified: Secondary | ICD-10-CM | POA: Diagnosis not present

## 2020-08-24 MED FILL — TACROLIMUS 1 MG CAPSULE: 1 | 30 days supply | Qty: 240 | Fill #2

## 2020-09-12 MED FILL — predniSONE 5 MG TABS: 5 | 30 days supply | Qty: 60 | Fill #1

## 2020-09-12 MED FILL — MYCOPHENOLATE SODIUM 180 MG: 180 | 30 days supply | Qty: 120 | Fill #2

## 2020-09-15 ENCOUNTER — Encounter (INDEPENDENT_AMBULATORY_CARE_PROVIDER_SITE_OTHER): Payer: Medicare Other | Admitting: Neurology

## 2020-09-15 ENCOUNTER — Ambulatory Visit (INDEPENDENT_AMBULATORY_CARE_PROVIDER_SITE_OTHER): Payer: Medicare Other | Admitting: Neurology

## 2020-09-15 DIAGNOSIS — G5623 Lesion of ulnar nerve, bilateral upper limbs: Secondary | ICD-10-CM

## 2020-09-15 DIAGNOSIS — I779 Disorder of arteries and arterioles, unspecified: Secondary | ICD-10-CM | POA: Diagnosis not present

## 2020-09-15 DIAGNOSIS — Z940 Kidney transplant status: Secondary | ICD-10-CM | POA: Diagnosis not present

## 2020-09-15 DIAGNOSIS — R200 Anesthesia of skin: Secondary | ICD-10-CM | POA: Insufficient documentation

## 2020-09-15 DIAGNOSIS — E1042 Type 1 diabetes mellitus with diabetic polyneuropathy: Secondary | ICD-10-CM | POA: Diagnosis not present

## 2020-09-15 DIAGNOSIS — Z0289 Encounter for other administrative examinations: Secondary | ICD-10-CM

## 2020-09-15 DIAGNOSIS — D849 Immunodeficiency, unspecified: Secondary | ICD-10-CM | POA: Diagnosis not present

## 2020-09-15 DIAGNOSIS — Z9483 Pancreas transplant status: Secondary | ICD-10-CM | POA: Diagnosis not present

## 2020-09-15 HISTORY — DX: Lesion of ulnar nerve, bilateral upper limbs: G56.23

## 2020-09-15 NOTE — Progress Notes (Addendum)
Full Name: Gregory Allen Gender: Male MRN #: [id number] Date of Birth: 05/19/1971    Visit Date: 09/15/2020 08:05 Age: 49 Years Examining Physician: Arlice Colt, MD  Referring Physician: Arlice Colt, MD Height: 5 feet 10 inch Patient History: 206lbs    History: Gregory Allen is a 49 year old man with longstanding type 1 diabetes mellitus who has had progressive numbness and weakness in his feet and lower legs.  He also has had progressive weakness numbness and reduced coordination in the hands.  On exam, he has atrophy in the ulnar innervated hand muscles.  Strength is 4 -/5 in the ulnar innervated muscles of the hands and 4+/5 in the APB muscles.  Strength is 4/5 in the toe extensors and 4+/5 in the ankle extensors.  He has reduced sensation to vibration at the ankles and absent sensation in the toes.  He has reduced vibration sensation in the fingers.  Nerve conduction studies: The left median motor response had a normal distal latencies with reduced amplitude and mildly reduced forearm conduction.  The right median motor response was normal.  Bilateral ulnar motor responses had normal distal latencies with reduced amplitudes and slowing around the elbow.  The peroneal and tibial motor responses were absent.  The median sensory responses had delayed distal latencies, left worse than right with reduced amplitudes.  The ulnar sensory responses were absent.  The left radial sensory response had a delayed distal latency and reduced amplitude.  The right radial sensory response had a normal peak latency and reduced amplitude.  The right sural and superficial peroneal sensory responses were absent.  Electromyography: Needle EMG of selected muscles of the right leg and both arms was performed.  In the right leg, there was mild chronic denervation in all of the muscles tested with moderate chronic elevation in the extensor digitorum brevis muscle.  There was no abnormal spontaneous  activity in the foot muscles.  In the right arm, there was mixed acute and moderate chronic denervation in the ulnar innervated first dorsal interosseous muscle.  The APB muscle had a couple polyphasic motor units but recruitment was normal..  Other muscles were normal.  There was no abnormal spontaneous activity outside of the hand.  In the left arm, there was moderate chronic denervation but no acute denervation in the ulnar innervated left FDIO muscle.  Mild chronic denervation was also noted in the left triceps.  And of the muscles were normal including the APB  Impression: This NCV/EMG study shows the following: 1.   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 2.   Moderately severe ulnar neuropathies, worse on the right where there was acute denervation mixed with chronic denervation.  On the left, conduction was clearly slowed across the elbow.  On the right, the slowing was more diffuse 3.   Mild median neuropathies across the wrist (carpal tunnel syndromes).  The APB muscles did not show significant denervation.  [name] A. Felecia Shelling, MD, PhD, FAAN Certified in Neurology, Clinical Neurophysiology, Sleep Medicine, Pain Medicine and Neuroimaging Director, [place] [facility] [address], [place], [place] [address] [phone]   Verbal informed consent was obtained from the patient, patient was informed of potential risk of procedure, including bruising, bleeding, hematoma formation, infection, muscle weakness, muscle pain, numbness, among others.        [place]    Nerve / Sites Muscle Latency Ref. Amplitude Ref. Rel  Amp Segments Distance Velocity Ref. Area    ms ms mV mV %  cm m/s m/s mVms  L Median - APB     Wrist APB 3.0 ?4.4 1.3 ?4.0 100 Wrist - APB 7   2.7     Upper arm APB 9.5  2.1  166 Upper arm - Wrist 24 37 ?49 6.8     Ulnar Wrist APB  5.0  1.0  45.5 Ulnar Wrist - APB    2.1  R Median - APB     Wrist APB 3.8 ?4.4 5.0 ?4.0 100 Wrist - APB 7   9.9     Upper arm APB 8.5  5.0  99.8 Upper arm - Wrist 23 49 ?49 11.7  L Ulnar - ADM     Wrist ADM 3.1 ?3.3 3.4 ?6.0 100 Wrist - ADM 7   13.0     B.Elbow ADM 6.8  2.5  74.1 B.Elbow - Wrist 22 59 ?49 9.9     A.Elbow ADM 9.7  2.9  113 A.Elbow - B.Elbow 10 34 ?49 12.9     Median Wrist ADM 3.1  0.2  7.75 Median Wrist - ADM    1.5  R Ulnar - ADM     Wrist ADM 3.3 ?3.3 2.9 ?6.0 100 Wrist - ADM 7   6.4     B.Elbow ADM 9.3  1.6  57.4 B.Elbow - Wrist 23 46 ?49 5.7     A.Elbow ADM 11.8  1.5  91.2 A.Elbow - B.Elbow 10 42 ?49 5.5  R Peroneal - EDB     Ankle EDB NR ?6.5 NR ?2.0 NR Ankle - EDB 9   NR     Fib head EDB NR  NR  NR Fib head - Ankle 29 NR ?44 NR  R Tibial - AH     Ankle AH NR ?5.8 NR ?4.0 NR Ankle - AH 9 NR  NR                 SNC    Nerve / Sites Rec. Site Peak Lat Ref.  Amp Ref. Segments Distance    ms ms V V  cm  L Radial - Anatomical snuff box (Forearm)     Forearm Wrist 3.3 ?2.9 13 ?15 Forearm - Wrist 10  R Radial - Anatomical snuff box (Forearm)     Forearm Wrist 2.9 ?2.9 9 ?15 Forearm - Wrist 10  R Sural - Ankle (Calf)     Calf Ankle NR ?4.4 NR ?6 Calf - Ankle 14  R Superficial peroneal - Ankle     Lat leg Ankle NR ?4.4 NR ?6 Lat leg - Ankle 14  L Median - Orthodromic (Dig II, Mid palm)     Dig II Wrist 4.6 ?3.4 5 ?10 Dig II - Wrist 13  R Median - Orthodromic (Dig II, Mid palm)     Dig II Wrist 4.0 ?3.4 5 ?10 Dig II - Wrist 13  L Ulnar - Orthodromic, (Dig V, Mid palm)     Dig V Wrist NR ?3.1 NR ?5 Dig V - Wrist 11  R Ulnar - Orthodromic, (Dig V, Mid palm)     Dig V Wrist NR ?3.1 NR ?5 Dig V - Wrist 73                     F  Wave    Nerve F Lat Ref.   ms ms  L Ulnar - ADM 36.4 ?32.0  R Ulnar - ADM 32.1 ?32.0         EMG Summary Table    Spontaneous MUAP Recruitment  Muscle IA Fib PSW Fasc Other Amp Dur. Poly Pattern  R. Deltoid Normal None None None  _______ Normal Normal Normal Normal  R. Triceps brachii Normal None None None _______ Normal Normal Normal Normal  R. Peroneus longus Normal None None None _______ Increased Increased 1+ Reduced  R. Tibialis anterior Normal None None None _______ Increased Increased 1+ Reduced  R. Extensor digitorum brevis Normal None None None _______ Normal Normal 2+ Discrete  R. Gluteus medius Normal None None None _______ Increased Increased 2+ Reduced  R. Iliopsoas Normal None None None _______ Normal Increased 1+ Reduced  R. Vastus medialis Normal None None None _______ Normal Increased 1+ Reduced  R. Extensor digitorum communis Normal None None None _______ Normal Normal 1+ Reduced  R. Biceps brachii Normal None None None _______ Normal Normal Normal Normal  R. First dorsal interosseous Normal 1+ 1+ None _______ Normal Increased 2+ Discrete  R. Abductor pollicis brevis Normal None None None _______ Normal Normal 1+ Normal  L. First dorsal interosseous Normal None None None _______ Normal Normal Normal Normal  L. Triceps brachii Normal None None None _______ Normal Normal 1+ Reduced  L. Deltoid Normal None None None _______ Normal Normal Normal Normal  L. Abductor pollicis brevis Normal None None None _______ Normal Normal Normal Normal        [facility]  PATIENT: Gregory Allen DOB: Nov 02, 1971  REFERRING DOCTOR OR PCP: Velna Hatchet SOURCE: Note from primary care, laboratory and imaging reports, MRI and CT images personally reviewed.  _________________________________   HISTORICAL  CHIEF COMPLAINT:  No chief complaint on file.   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  He feels his balance is off when he closes his eyes or in the dark.  Additionally he has noted more clumsiness in the hands.  Tasks  like buttoning are more difficult.  Tasks with the hands are also more difficult when he closes his eyes.   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    His neuropathy has progressed a lot over the past couple years.  Numbness is in his feet and ankles.    He notes cramps in his legs, left > right.   He has some numbness in his hands.    His hands cramp up with writing and using utensils (right).     He also has lower back pain.  Currently, his diabetes is stable.   Last HgbA1c was 6.4 and he has been insulin free since the transplant but needs to be on tacrolimus, mycophenolate and prednisone.     Vascular risks:   Type 1 IDDM, HTN.   No tobacco use.     Images personally reviewed: MRI of the lumbar spine from 04/23/2020. At L4-L5, there is mild disc bulging and mild facet and ligamenta flava hypertrophy causing mild foraminal narrowing but no nerve root compression. At L5-S1, there is prior right hemilaminectomy. There is residual disc protrusion, more towards the right and increased epidural fat. There is moderately severe right foraminal narrowing that could affect the right L5 nerve root and moderate lateral recess stenosis that could affect the right S1 nerve root. There is mild foraminal and  lateral recess narrowing on the left that should not affect those nerve roots.  CT scan of the head 12/21/2015 showed 1 small focus of hypoattenuation in the medial right frontal lobe that could represent a remote focus of ischemic change. There were no acute findings.  CMP 06/15/2020 showed glucose 167 and creatinine 1.08.  Hemoglobin A1c 05/19/2020 equals 6.4.  REVIEW OF SYSTEMS: Constitutional: No fevers, chills, sweats, or change in appetite Eyes: No visual changes, double vision, eye pain Ear, nose and throat: No hearing loss, ear pain, nasal congestion, sore throat Cardiovascular: No chest pain, palpitations Respiratory: No shortness of breath at rest or with exertion.   No  wheezes GastrointestinaI: No nausea, vomiting, diarrhea, abdominal pain, fecal incontinence Genitourinary: No dysuria, urinary retention or frequency.  No nocturia.  Creatinine has been elevated at times. Musculoskeletal:As above  integumentary: No rash, pruritus, skin lesions Neurological: as above Psychiatric: No depression at this time.  No anxiety Endocrine: As above  hematologic/Lymphatic: No anemia, purpura, petechiae.   ALLERGIES: Allergies  Allergen Reactions  . Shellfish-Derived Products Itching    "throat starts itching" pt states "he is ok with iodine"    HOME MEDICATIONS:  Current Outpatient Medications:  .  acetaminophen (TYLENOL) 500 MG tablet, Take 1,000 mg by mouth every 6 (six) hours as needed for moderate pain or headache., Disp: , Rfl:  .  amLODipine (NORVASC) 10 MG tablet, Take 10 mg by mouth daily. , Disp: , Rfl:  .  aspirin EC 81 MG tablet, Take 81 mg by mouth daily. , Disp: , Rfl:  .  Capsaicin 0.1 % CREA, Apply 1 application topically daily as needed (pain)., Disp: , Rfl:  .  carvedilol (COREG) 12.5 MG tablet, Take 12.5 mg by mouth 2 (two) times daily with a meal., Disp: , Rfl:  .  cilostazol (PLETAL) 100 MG tablet, Take 1 tablet (100 mg total) by mouth 2 (two) times daily before a meal., Disp: 60 tablet, Rfl: 11 .  DULoxetine (CYMBALTA) 30 MG capsule, Take 30 mg by mouth 2 (two) times daily. , Disp: , Rfl:  .  gabapentin (NEURONTIN) 300 MG capsule, Take 600 mg by mouth 2 (two) times daily., Disp: , Rfl:  .  Magnesium 400 MG TABS, Take 400 mg by mouth daily., Disp: , Rfl:  .  methocarbamol (ROBAXIN) 500 MG tablet, Take 500 mg by mouth 2 (two) times daily. , Disp: , Rfl:  .  mycophenolate (MYFORTIC) 180 MG EC tablet, Take 1 tablet (180 mg total) by mouth 2 (two) times daily. (Patient taking differently: Take 360 mg by mouth 2 (two) times daily. ), Disp: 60 tablet, Rfl: 5 .  pentoxifylline (TRENTAL) 400 MG CR tablet, Take 400 mg by mouth 2 (two) times daily.,  Disp: , Rfl:  .  predniSONE (DELTASONE) 5 MG tablet, Take 5 mg by mouth 2 (two) times daily., Disp: , Rfl:  .  rosuvastatin (CRESTOR) 5 MG tablet, Take 5 mg by mouth every Monday, Wednesday, and Friday., Disp: , Rfl:  .  sulfamethoxazole-trimethoprim (BACTRIM,SEPTRA) 400-80 MG tablet, Take 1 tablet by mouth every Monday, Wednesday, and Friday. , Disp: , Rfl: 0 .  tacrolimus (PROGRAF) 1 MG capsule, Take 4 mg by mouth 2 (two) times daily. , Disp: , Rfl:   PAST MEDICAL HISTORY: Past Medical History:  Diagnosis Date  . Diabetes mellitus without complication ([place])     PAST SURGICAL HISTORY: Past Surgical History:  Procedure Laterality Date  . ABDOMINAL AORTOGRAM W/LOWER EXTREMITY Bilateral 06/12/2020  Procedure: ABDOMINAL AORTOGRAM W/LOWER EXTREMITY;  Surgeon: Elam Dutch, MD;  Location: [facility];  Service: Cardiovascular;  Laterality: Bilateral;  Bilateral   . KIDNEY TRANSPLANT    . TRANSPLANT PANCREATIC ALLOGRAFT      FAMILY HISTORY: Family History  Problem Relation Age of Onset  . Diabetes Mother   . Hypertension Mother   . Diabetes Father   . Hypertension Father       PHYSICAL EXAM  There were no vitals filed for this visit.  There is no height or weight on file to calculate BMI.   General: The patient is well-developed and well-nourished and in no acute distress  HEENT:  Head is [place]/AT.    Skin: Extremities are without rash or edema.      Neurologic Exam  Mental status: The patient is alert and oriented x 3 at the time of the examination.   Speech is normal.  Cranial nerves: Extraocular movements are full.   Facial strength is normal.    Motor:  Muscle bulk is reduced in the ulnar innervated intrinsic hand muscles in hands, right more than left.   Tone is normal. Strength is  4-/5 in ulnar innervated intrinsic hand muscles and 4+/5 in the APB muscles and 4/5 toe extension.   Sensory: Sensory testing shows reduced vibration in fingers, normal  pinprick in fingers, very reduced (10-20%) vibration in toes and 50% at ankles, reduced pinprick to above ankles.  Gait and station: Station is normal.   Gait is normal. Tandem gait is mildly wide. Romberg is borderline.   Reflexes: Deep tendon reflexes are symmetric and 1+ in arms and absent in legs.       DIAGNOSTIC DATA (LABS, IMAGING, TESTING) - I reviewed patient records, labs, notes, testing and imaging myself where available.  Lab Results  Component Value Date   WBC 8.2 11/05/2017   HGB 14.6 06/12/2020   HCT 43.0 06/12/2020   MCV 78.5 11/05/2017   PLT 264 11/05/2017      Component Value Date/Time   NA 144 06/12/2020 1010   K 3.5 06/12/2020 1010   CL 103 06/12/2020 1010   CO2 18 (L) 11/05/2017 0755   GLUCOSE 114 (H) 06/12/2020 1010   BUN 14 06/12/2020 1010   CREATININE 0.90 06/12/2020 1010   CALCIUM 8.9 11/05/2017 0755   PROT 7.6 11/05/2017 0755   ALBUMIN 3.9 11/05/2017 0755   AST 24 11/05/2017 0755   ALT 36 11/05/2017 0755   ALKPHOS 58 11/05/2017 0755   BILITOT 0.8 11/05/2017 0755   GFRNONAA 47 (L) 11/05/2017 0755   GFRAA 55 (L) 11/05/2017 0755       ASSESSMENT AND PLAN   Diabetic polyneuropathy associated with type 1 diabetes mellitus (HCC)  Numbness  Ulnar neuropathy of both upper extremities   1.  We discussed that the symptoms of the hands are likely a combination of ulnar neuropathy combined with a diabetic polyneuropathy.  It is possible that ulnar release surgery could help some of his symptoms and I will refer him to orthopedics. 2.   The diabetic polyneuropathy is more numbness and balance issues rather than pain so likely involves lots of fibers more than small fibers.  He is advised to keep his diabetes under the best control possible.  Additionally he needs to be careful especially in crowded or dark environments as he has an increased potential for falls 3.   Return as needed if there are new or worsening neurologic symptoms.  Airyonna Franklyn A.  Felecia Shelling, MD, [facility] 18/02/8591, 76:39 AM Certified in Neurology, Clinical Neurophysiology, Sleep Medicine and Neuroimaging  [facility] [address] [place], [place] [address] [phone]

## 2020-09-15 NOTE — Addendum Note (Signed)
Addended by: Arlice Colt A on: 09/15/2020 10:15 AM   Modules accepted: Orders

## 2020-09-16 MED FILL — ROSUVASTATIN CALCIUM 5 MG T: 5 | 84 days supply | Qty: 36 | Fill #2

## 2020-09-21 ENCOUNTER — Other Ambulatory Visit (HOSPITAL_COMMUNITY): Payer: Self-pay | Admitting: Pain Medicine

## 2020-09-21 DIAGNOSIS — E1140 Type 2 diabetes mellitus with diabetic neuropathy, unspecified: Secondary | ICD-10-CM | POA: Diagnosis not present

## 2020-09-21 MED FILL — GABAPENTIN 300 MG CAPSULE: 300 | 30 days supply | Qty: 270 | Fill #0

## 2020-09-21 MED FILL — DULoxetine HCL 30 MG CPEP: 30 | 30 days supply | Qty: 90 | Fill #0

## 2020-09-24 ENCOUNTER — Ambulatory Visit: Payer: 59 | Admitting: Orthopedic Surgery

## 2020-09-26 MED FILL — TACROLIMUS 1 MG CAPSULE: 1 | 30 days supply | Qty: 240 | Fill #3

## 2020-09-28 ENCOUNTER — Telehealth: Payer: Self-pay | Admitting: Neurology

## 2020-09-28 NOTE — Telephone Encounter (Signed)
Patient missed Apt but I Called Him and spoke to and relayed that he would have to call and Reschedule his apt . Patient was fine with this.  Orthopedic Referral.

## 2020-10-07 DIAGNOSIS — Z9483 Pancreas transplant status: Secondary | ICD-10-CM | POA: Diagnosis not present

## 2020-10-07 DIAGNOSIS — I129 Hypertensive chronic kidney disease with stage 1 through stage 4 chronic kidney disease, or unspecified chronic kidney disease: Secondary | ICD-10-CM | POA: Diagnosis not present

## 2020-10-07 DIAGNOSIS — I739 Peripheral vascular disease, unspecified: Secondary | ICD-10-CM | POA: Diagnosis not present

## 2020-10-07 DIAGNOSIS — G629 Polyneuropathy, unspecified: Secondary | ICD-10-CM | POA: Diagnosis not present

## 2020-10-07 DIAGNOSIS — B348 Other viral infections of unspecified site: Secondary | ICD-10-CM | POA: Diagnosis not present

## 2020-10-07 DIAGNOSIS — Z940 Kidney transplant status: Secondary | ICD-10-CM | POA: Diagnosis not present

## 2020-10-12 ENCOUNTER — Other Ambulatory Visit (HOSPITAL_COMMUNITY): Payer: Self-pay | Admitting: Internal Medicine

## 2020-10-12 DIAGNOSIS — Z5181 Encounter for therapeutic drug level monitoring: Secondary | ICD-10-CM | POA: Diagnosis not present

## 2020-10-12 DIAGNOSIS — Z7952 Long term (current) use of systemic steroids: Secondary | ICD-10-CM | POA: Diagnosis not present

## 2020-10-12 DIAGNOSIS — Z9483 Pancreas transplant status: Secondary | ICD-10-CM | POA: Diagnosis not present

## 2020-10-12 DIAGNOSIS — B259 Cytomegaloviral disease, unspecified: Secondary | ICD-10-CM | POA: Diagnosis not present

## 2020-10-12 DIAGNOSIS — I739 Peripheral vascular disease, unspecified: Secondary | ICD-10-CM | POA: Diagnosis not present

## 2020-10-12 DIAGNOSIS — Z23 Encounter for immunization: Secondary | ICD-10-CM | POA: Diagnosis not present

## 2020-10-12 DIAGNOSIS — G629 Polyneuropathy, unspecified: Secondary | ICD-10-CM | POA: Diagnosis not present

## 2020-10-12 DIAGNOSIS — I10 Essential (primary) hypertension: Secondary | ICD-10-CM | POA: Diagnosis not present

## 2020-10-12 DIAGNOSIS — R200 Anesthesia of skin: Secondary | ICD-10-CM | POA: Diagnosis not present

## 2020-10-12 DIAGNOSIS — Z940 Kidney transplant status: Secondary | ICD-10-CM | POA: Diagnosis not present

## 2020-10-12 DIAGNOSIS — D849 Immunodeficiency, unspecified: Secondary | ICD-10-CM | POA: Diagnosis not present

## 2020-10-12 DIAGNOSIS — E119 Type 2 diabetes mellitus without complications: Secondary | ICD-10-CM | POA: Diagnosis not present

## 2020-10-12 DIAGNOSIS — Z48288 Encounter for aftercare following multiple organ transplant: Secondary | ICD-10-CM | POA: Diagnosis not present

## 2020-10-12 DIAGNOSIS — Z79899 Other long term (current) drug therapy: Secondary | ICD-10-CM | POA: Diagnosis not present

## 2020-10-12 MED FILL — MYCOPHENOLATE SODIUM 180 MG: 180 | 30 days supply | Qty: 120 | Fill #3

## 2020-10-12 MED FILL — METHOCARBAMOL 500 MG TABLET: 500 | 30 days supply | Qty: 60 | Fill #0

## 2020-10-16 ENCOUNTER — Ambulatory Visit (INDEPENDENT_AMBULATORY_CARE_PROVIDER_SITE_OTHER): Payer: 59 | Admitting: Orthopaedic Surgery

## 2020-10-16 ENCOUNTER — Ambulatory Visit: Payer: Self-pay

## 2020-10-16 ENCOUNTER — Other Ambulatory Visit: Payer: Self-pay

## 2020-10-16 ENCOUNTER — Encounter: Payer: Self-pay | Admitting: Orthopaedic Surgery

## 2020-10-16 DIAGNOSIS — G5621 Lesion of ulnar nerve, right upper limb: Secondary | ICD-10-CM | POA: Diagnosis not present

## 2020-10-16 DIAGNOSIS — I779 Disorder of arteries and arterioles, unspecified: Secondary | ICD-10-CM

## 2020-10-16 DIAGNOSIS — G5622 Lesion of ulnar nerve, left upper limb: Secondary | ICD-10-CM | POA: Insufficient documentation

## 2020-10-16 NOTE — Progress Notes (Signed)
I have read the note, and I agree with the clinical assessment and plan.  Jossue Rubenstein A. Taylin Leder, MD, PhD, FAAN Certified in Neurology, Clinical Neurophysiology, Sleep Medicine, Pain Medicine and Neuroimaging  [facility] [address], [address] [place], [place] [address] [phone]  

## 2020-10-16 NOTE — Progress Notes (Signed)
Office Visit Note   Patient: Gregory Allen           Date of Birth: Mar 09, 1971           MRN: [id number] Visit Date: 10/16/2020              Requested by: Britt Bottom, MD [address] [place],  [place] [address] PCP: Velna Hatchet, MD   Assessment & Plan: Visit Diagnoses:  1. Cubital tunnel syndrome on right   2. Cubital tunnel syndrome on left     Plan: Impression is bilateral severe cubital tunnel syndrome worse on the right.  I reviewed the recent nerve conduction studies from Dr. Garth Bigness office.  Based on our discussion and findings I have recommended sequential cubital tunnel releases starting with the right 1.  Risk benefits rehab recovery reviewed with the patient in detail.  Questions encouraged and answered.  Follow-Up Instructions: Return if symptoms worsen or fail to improve.   Orders:  Orders Placed This Encounter  Procedures  . XR Elbow 2 Views Left  . XR Elbow 2 Views Right   No orders of the defined types were placed in this encounter.     Procedures: No procedures performed   Clinical Data: No additional findings.   Subjective: Chief Complaint  Patient presents with  . Right Hand - Pain  . Left Hand - Pain    Gregory Allen is a 49 year old gentleman with diabetes and diabetic polyneuropathy who comes in for evaluation of bilateral cubital tunnel syndrome worse on the right.  He recently saw Dr. Felecia Shelling of neurology and he had nerve conduction studies confirming this.  He states that he has constant numbness and weakness in his hands he has been dropping objects out of his hand.  He has noticed weakness in grip as well.   Review of Systems  Constitutional: Negative.   All other systems reviewed and are negative.    Objective: Vital Signs: There were no vitals taken for this visit.  Physical Exam Vitals and nursing note reviewed.  Constitutional:      Appearance: He is well-developed and well-nourished.  HENT:     Head: Normocephalic  and atraumatic.  Eyes:     Pupils: Pupils are equal, round, and reactive to light.  Pulmonary:     Effort: Pulmonary effort is normal.  Abdominal:     Palpations: Abdomen is soft.  Musculoskeletal:        General: Normal range of motion.     Cervical back: Neck supple.  Skin:    General: Skin is warm.  Neurological:     Mental Status: He is alert and oriented to person, place, and time.  Psychiatric:        Mood and Affect: Mood and affect normal.        Behavior: Behavior normal.        Thought Content: Thought content normal.        Judgment: Judgment normal.     Ortho Exam Bilateral upper extremities show first dorsal interosseous muscle atrophy with weakness to finger abduction.  Positive Tinel's at the elbow.  Ulnar nerve is stable.  Decreased sensation to the ulnar nerve distribution.  Specialty Comments:  No specialty comments available.  Imaging: XR Elbow 2 Views Left  Result Date: 10/16/2020 No acute or structural abnormalities  XR Elbow 2 Views Right  Result Date: 10/16/2020 No acute or structural abnormalities.    PMFS History: Patient Active Problem List   Diagnosis Date Noted  .  Cubital tunnel syndrome on right 10/16/2020  . Cubital tunnel syndrome on left 10/16/2020  . Numbness 09/15/2020  . Ulnar neuropathy of both upper extremities 09/15/2020  . BP (high blood pressure) 11/19/2015  . History of organ or tissue transplant 05/12/2015  . Essential (primary) hypertension 05/12/2015  . Combined fat and carbohydrate induced hyperlipemia 05/12/2015  . Type 1 diabetes mellitus with hyperglycemia ([place]) 05/12/2015  . Arthritis of hand, degenerative 09/08/2014  . Hypophosphatemia 04/24/2012  . Carpal bone fracture 01/17/2012  . Tarsal tunnel syndrome 01/17/2012  . Nonproliferative diabetic retinopathy ([place]) 02/08/2011  . Type 1 diabetes mellitus ([place]) 02/08/2011  . Diabetic polyneuropathy associated with type 1 diabetes mellitus ([place]) 02/08/2011  .  Abnormal presence of protein in urine 02/08/2011   Past Medical History:  Diagnosis Date  . Diabetes mellitus without complication ([place])     Family History  Problem Relation Age of Onset  . Diabetes Mother   . Hypertension Mother   . Diabetes Father   . Hypertension Father     Past Surgical History:  Procedure Laterality Date  . ABDOMINAL AORTOGRAM W/LOWER EXTREMITY Bilateral 06/12/2020   Procedure: ABDOMINAL AORTOGRAM W/LOWER EXTREMITY;  Surgeon: Elam Dutch, MD;  Location: [facility];  Service: Cardiovascular;  Laterality: Bilateral;  Bilateral   . KIDNEY TRANSPLANT    . TRANSPLANT PANCREATIC ALLOGRAFT     Social History   Occupational History  . Occupation: Disabled   Tobacco Use  . Smoking status: Never Smoker  . Smokeless tobacco: Never Used  Vaping Use  . Vaping Use: Never used  Substance and Sexual Activity  . Alcohol use: No    Alcohol/week: 0.0 standard drinks  . Drug use: No  . Sexual activity: Yes

## 2020-10-21 ENCOUNTER — Other Ambulatory Visit: Payer: Self-pay

## 2020-10-21 ENCOUNTER — Encounter (HOSPITAL_BASED_OUTPATIENT_CLINIC_OR_DEPARTMENT_OTHER): Payer: Self-pay | Admitting: Orthopaedic Surgery

## 2020-10-21 NOTE — Progress Notes (Signed)
Patient's Epic and Care everywhere chart reviewed with Dr Sabra Heck, [place] for [facility].

## 2020-10-24 ENCOUNTER — Inpatient Hospital Stay (HOSPITAL_COMMUNITY): Admission: RE | Admit: 2020-10-24 | Payer: 59 | Source: Ambulatory Visit

## 2020-10-25 MED FILL — SULFAMETHOXAZOLE-TMP SS TAB: 400-80 | 84 days supply | Qty: 36 | Fill #1

## 2020-10-25 MED FILL — TACROLIMUS 1 MG CAPSULE: 1 | 30 days supply | Qty: 240 | Fill #4

## 2020-10-26 ENCOUNTER — Other Ambulatory Visit (HOSPITAL_COMMUNITY)
Admission: RE | Admit: 2020-10-26 | Discharge: 2020-10-26 | Disposition: A | Discharge status: Home / Self Care | Payer: 59 | Source: Ambulatory Visit | Attending: Orthopaedic Surgery | Admitting: Orthopaedic Surgery

## 2020-10-26 ENCOUNTER — Encounter (HOSPITAL_BASED_OUTPATIENT_CLINIC_OR_DEPARTMENT_OTHER)
Admission: RE | Admit: 2020-10-26 | Discharge: 2020-10-26 | Disposition: A | Discharge status: Home / Self Care | Payer: 59 | Source: Ambulatory Visit | Attending: Orthopaedic Surgery | Admitting: Orthopaedic Surgery

## 2020-10-26 ENCOUNTER — Other Ambulatory Visit: Payer: Self-pay | Admitting: Orthopaedic Surgery

## 2020-10-26 DIAGNOSIS — Z9483 Pancreas transplant status: Secondary | ICD-10-CM | POA: Diagnosis not present

## 2020-10-26 DIAGNOSIS — Z833 Family history of diabetes mellitus: Secondary | ICD-10-CM | POA: Diagnosis not present

## 2020-10-26 DIAGNOSIS — Z91041 Radiographic dye allergy status: Secondary | ICD-10-CM | POA: Diagnosis not present

## 2020-10-26 DIAGNOSIS — G5621 Lesion of ulnar nerve, right upper limb: Secondary | ICD-10-CM | POA: Diagnosis not present

## 2020-10-26 DIAGNOSIS — Z940 Kidney transplant status: Secondary | ICD-10-CM | POA: Diagnosis not present

## 2020-10-26 DIAGNOSIS — Z79899 Other long term (current) drug therapy: Secondary | ICD-10-CM | POA: Diagnosis not present

## 2020-10-26 DIAGNOSIS — Z20822 Contact with and (suspected) exposure to covid-19: Secondary | ICD-10-CM | POA: Insufficient documentation

## 2020-10-26 DIAGNOSIS — Z8249 Family history of ischemic heart disease and other diseases of the circulatory system: Secondary | ICD-10-CM | POA: Diagnosis not present

## 2020-10-26 DIAGNOSIS — Z01812 Encounter for preprocedural laboratory examination: Secondary | ICD-10-CM | POA: Insufficient documentation

## 2020-10-26 DIAGNOSIS — Z7952 Long term (current) use of systemic steroids: Secondary | ICD-10-CM | POA: Diagnosis not present

## 2020-10-26 DIAGNOSIS — Z7982 Long term (current) use of aspirin: Secondary | ICD-10-CM | POA: Diagnosis not present

## 2020-10-26 LAB — BASIC METABOLIC PANEL
Anion gap: 11 (ref 5–15)
BUN: 16 mg/dL (ref 6–20)
CO2: 20 mmol/L — ABNORMAL LOW (ref 22–32)
Calcium: 8.8 mg/dL — ABNORMAL LOW (ref 8.9–10.3)
Chloride: 106 mmol/L (ref 98–111)
Creatinine, Ser: 1.04 mg/dL (ref 0.61–1.24)
GFR, Estimated: >60 mL/min (ref >60)
Glucose, Bld: 111 mg/dL — ABNORMAL HIGH (ref 70–99)
Potassium: 4.2 mmol/L (ref 3.5–5.1)
Sodium: 137 mmol/L (ref 135–145)

## 2020-10-26 LAB — SARS CORONAVIRUS 2 (TAT 6-24 HRS): SARS Coronavirus 2: NEGATIVE

## 2020-10-26 NOTE — Progress Notes (Signed)

## 2020-10-28 ENCOUNTER — Ambulatory Visit (HOSPITAL_BASED_OUTPATIENT_CLINIC_OR_DEPARTMENT_OTHER): Payer: 59 | Admitting: Anesthesiology

## 2020-10-28 ENCOUNTER — Other Ambulatory Visit: Payer: Self-pay

## 2020-10-28 ENCOUNTER — Encounter (HOSPITAL_BASED_OUTPATIENT_CLINIC_OR_DEPARTMENT_OTHER): Payer: Self-pay | Admitting: Orthopaedic Surgery

## 2020-10-28 ENCOUNTER — Ambulatory Visit (HOSPITAL_BASED_OUTPATIENT_CLINIC_OR_DEPARTMENT_OTHER)
Admission: RE | Admit: 2020-10-28 | Discharge: 2020-10-28 | Disposition: A | Discharge status: Home / Self Care | Payer: 59 | Attending: Orthopaedic Surgery | Admitting: Orthopaedic Surgery

## 2020-10-28 ENCOUNTER — Encounter (HOSPITAL_BASED_OUTPATIENT_CLINIC_OR_DEPARTMENT_OTHER)
Admission: RE | Disposition: A | Discharge status: Home / Self Care | Payer: Self-pay | Source: Home / Self Care | Attending: Orthopaedic Surgery

## 2020-10-28 ENCOUNTER — Other Ambulatory Visit (HOSPITAL_BASED_OUTPATIENT_CLINIC_OR_DEPARTMENT_OTHER): Payer: Self-pay | Admitting: Orthopaedic Surgery

## 2020-10-28 DIAGNOSIS — Z9483 Pancreas transplant status: Secondary | ICD-10-CM | POA: Diagnosis not present

## 2020-10-28 DIAGNOSIS — Z8249 Family history of ischemic heart disease and other diseases of the circulatory system: Secondary | ICD-10-CM | POA: Insufficient documentation

## 2020-10-28 DIAGNOSIS — Z91041 Radiographic dye allergy status: Secondary | ICD-10-CM | POA: Diagnosis not present

## 2020-10-28 DIAGNOSIS — Z7982 Long term (current) use of aspirin: Secondary | ICD-10-CM | POA: Insufficient documentation

## 2020-10-28 DIAGNOSIS — Z7952 Long term (current) use of systemic steroids: Secondary | ICD-10-CM | POA: Diagnosis not present

## 2020-10-28 DIAGNOSIS — Z940 Kidney transplant status: Secondary | ICD-10-CM | POA: Insufficient documentation

## 2020-10-28 DIAGNOSIS — Z79899 Other long term (current) drug therapy: Secondary | ICD-10-CM | POA: Diagnosis not present

## 2020-10-28 DIAGNOSIS — Z833 Family history of diabetes mellitus: Secondary | ICD-10-CM | POA: Insufficient documentation

## 2020-10-28 DIAGNOSIS — M1712 Unilateral primary osteoarthritis, left knee: Secondary | ICD-10-CM | POA: Diagnosis not present

## 2020-10-28 DIAGNOSIS — G5621 Lesion of ulnar nerve, right upper limb: Secondary | ICD-10-CM | POA: Diagnosis not present

## 2020-10-28 DIAGNOSIS — I10 Essential (primary) hypertension: Secondary | ICD-10-CM | POA: Diagnosis not present

## 2020-10-28 DIAGNOSIS — E1065 Type 1 diabetes mellitus with hyperglycemia: Secondary | ICD-10-CM | POA: Diagnosis not present

## 2020-10-28 HISTORY — DX: Polyneuropathy, unspecified: G62.9

## 2020-10-28 HISTORY — DX: Peripheral vascular disease, unspecified: I73.9

## 2020-10-28 HISTORY — DX: Essential (primary) hypertension: I10

## 2020-10-28 HISTORY — PX: ULNAR TUNNEL RELEASE: SHX820

## 2020-10-28 HISTORY — DX: Unspecified osteoarthritis, unspecified site: M19.90

## 2020-10-28 SURGERY — RELEASE, CUBITAL TUNNEL
Anesthesia: Monitor Anesthesia Care | Site: Elbow | Laterality: Right

## 2020-10-28 MED ORDER — MIDAZOLAM HCL 2 MG/2ML IJ SOLN
INTRAMUSCULAR | Status: AC
  Filled 2020-10-28: qty 2

## 2020-10-28 MED ORDER — PROPOFOL 500 MG/50ML IV EMUL
INTRAVENOUS | Status: DC | PRN
  Administered 2020-10-28: 75 ug/kg/min via INTRAVENOUS

## 2020-10-28 MED ORDER — MIDAZOLAM HCL 2 MG/2ML IJ SOLN
2.0000 mg | Freq: Once | INTRAMUSCULAR | Status: AC
  Administered 2020-10-28: 2 mg via INTRAVENOUS

## 2020-10-28 MED ORDER — FENTANYL CITRATE (PF) 100 MCG/2ML IJ SOLN
INTRAMUSCULAR | Status: AC
  Filled 2020-10-28: qty 2

## 2020-10-28 MED ORDER — MIDAZOLAM HCL 5 MG/5ML IJ SOLN
INTRAMUSCULAR | Status: DC | PRN
  Administered 2020-10-28: 2 mg via INTRAVENOUS

## 2020-10-28 MED ORDER — CEFAZOLIN SODIUM-DEXTROSE 2-4 GM/100ML-% IV SOLN
2.0000 g | INTRAVENOUS | Status: AC
  Administered 2020-10-28: 2 g via INTRAVENOUS

## 2020-10-28 MED ORDER — OXYCODONE-ACETAMINOPHEN 5-325 MG PO TABS: 1.0000 | ORAL_TABLET | Freq: Three times a day (TID) | ORAL | 0 refills | Status: DC | PRN

## 2020-10-28 MED ORDER — LACTATED RINGERS IV SOLN: INTRAVENOUS | Status: DC

## 2020-10-28 MED ORDER — FENTANYL CITRATE (PF) 100 MCG/2ML IJ SOLN
100.0000 ug | Freq: Once | INTRAMUSCULAR | Status: AC
  Administered 2020-10-28: 100 ug via INTRAVENOUS

## 2020-10-28 MED ORDER — OXYCODONE HCL 5 MG PO TABS: 5.0000 mg | ORAL_TABLET | Freq: Once | ORAL | Status: DC | PRN

## 2020-10-28 MED ORDER — PROMETHAZINE HCL 25 MG/ML IJ SOLN
6.2500 mg | INTRAMUSCULAR | Status: DC | PRN
Start: 2020-10-28 — End: 2020-10-28

## 2020-10-28 MED ORDER — EPHEDRINE 5 MG/ML INJ
INTRAVENOUS | Status: AC
  Filled 2020-10-28: qty 10

## 2020-10-28 MED ORDER — ONDANSETRON HCL 4 MG/2ML IJ SOLN
INTRAMUSCULAR | Status: DC | PRN
  Administered 2020-10-28: 4 mg via INTRAVENOUS

## 2020-10-28 MED ORDER — OXYCODONE HCL 5 MG/5ML PO SOLN: 5.0000 mg | Freq: Once | ORAL | Status: DC | PRN

## 2020-10-28 MED ORDER — LIDOCAINE 2% (20 MG/ML) 5 ML SYRINGE
INTRAMUSCULAR | Status: DC | PRN
  Administered 2020-10-28: 40 mg via INTRAVENOUS

## 2020-10-28 MED ORDER — PROPOFOL 10 MG/ML IV BOLUS
INTRAVENOUS | Status: AC
  Filled 2020-10-28: qty 20

## 2020-10-28 MED ORDER — HYDROMORPHONE HCL 1 MG/ML IJ SOLN
0.2500 mg | INTRAMUSCULAR | Status: DC | PRN
Start: 2020-10-28 — End: 2020-10-28

## 2020-10-28 MED ORDER — CEFAZOLIN SODIUM-DEXTROSE 2-4 GM/100ML-% IV SOLN
INTRAVENOUS | Status: AC
  Filled 2020-10-28: qty 100

## 2020-10-28 MED ORDER — ROPIVACAINE HCL 5 MG/ML IJ SOLN
INTRAMUSCULAR | Status: DC | PRN
  Administered 2020-10-28: 30 mL via PERINEURAL

## 2020-10-28 MED ORDER — ONDANSETRON HCL 4 MG PO TABS: 4.0000 mg | ORAL_TABLET | Freq: Three times a day (TID) | ORAL | 0 refills | Status: DC | PRN

## 2020-10-28 MED FILL — OXYCODONE-APAP 5-325MG: 5-325 | 5 days supply | Qty: 30 | Fill #0

## 2020-10-28 MED FILL — ONDANSETRON HCL 4 MG TABLET: 4 | 3 days supply | Qty: 20 | Fill #0

## 2020-10-28 SURGICAL SUPPLY — 60 items
BLADE SURG 15 STRL LF DISP TIS (BLADE) ×1 IMPLANT
BLADE SURG 15 STRL SS (BLADE) ×2
BNDG CMPR 9X4 STRL LF SNTH (GAUZE/BANDAGES/DRESSINGS) ×1
BNDG ELASTIC 3X5.8 VLCR STR LF (GAUZE/BANDAGES/DRESSINGS) ×2 IMPLANT
BNDG ESMARK 4X9 LF (GAUZE/BANDAGES/DRESSINGS) ×2 IMPLANT
BNDG GAUZE ELAST 4 BULKY (GAUZE/BANDAGES/DRESSINGS) ×2 IMPLANT
BRUSH SCRUB EZ PLAIN DRY (MISCELLANEOUS) ×2 IMPLANT
CORD BIPOLAR FORCEPS 12FT (ELECTRODE) ×2 IMPLANT
COVER BACK TABLE 60X90IN (DRAPES) ×2 IMPLANT
COVER MAYO STAND STRL (DRAPES) ×2 IMPLANT
COVER WAND RF STERILE (DRAPES) IMPLANT
CUFF TOURN SGL QUICK 18X4 (TOURNIQUET CUFF) ×2 IMPLANT
DECANTER SPIKE VIAL GLASS SM (MISCELLANEOUS) IMPLANT
DRAPE EXTREMITY T 121X128X90 (DISPOSABLE) ×2 IMPLANT
DRAPE SURG 17X23 STRL (DRAPES) ×2 IMPLANT
DRSG PAD ABDOMINAL 8X10 ST (GAUZE/BANDAGES/DRESSINGS) ×2 IMPLANT
GAUZE SPONGE 4X4 12PLY STRL (GAUZE/BANDAGES/DRESSINGS) ×2 IMPLANT
GAUZE XEROFORM 1X8 LF (GAUZE/BANDAGES/DRESSINGS) ×2 IMPLANT
GLOVE BIO SURGEON STRL SZ 6.5 (GLOVE) ×2 IMPLANT
GLOVE BIOGEL PI IND STRL 7.0 (GLOVE) ×1 IMPLANT
GLOVE BIOGEL PI INDICATOR 7.0 (GLOVE) ×1
GLOVE SURG LTX SZ7 (GLOVE) ×2 IMPLANT
GLOVE SURG NEOP MICRO LF SZ7.5 (GLOVE) ×2 IMPLANT
GLOVE SURG SYN 7.5  E (GLOVE) ×1
GLOVE SURG SYN 7.5 E (GLOVE) ×1 IMPLANT
GOWN STRL REIN XL XLG (GOWN DISPOSABLE) ×4 IMPLANT
GOWN STRL REUS W/ TWL LRG LVL3 (GOWN DISPOSABLE) ×1 IMPLANT
GOWN STRL REUS W/ TWL XL LVL3 (GOWN DISPOSABLE) ×1 IMPLANT
GOWN STRL REUS W/TWL LRG LVL3 (GOWN DISPOSABLE) ×2
GOWN STRL REUS W/TWL XL LVL3 (GOWN DISPOSABLE) ×2
LOOP VESSEL MAXI BLUE (MISCELLANEOUS) ×2 IMPLANT
NEEDLE HYPO 25X1 1.5 SAFETY (NEEDLE) IMPLANT
NS IRRIG 1000ML POUR BTL (IV SOLUTION) ×2 IMPLANT
PACK BASIN DAY SURGERY FS (CUSTOM PROCEDURE TRAY) ×2 IMPLANT
PAD CAST 3X4 CTTN HI CHSV (CAST SUPPLIES) ×1 IMPLANT
PADDING CAST ABS 4INX4YD NS (CAST SUPPLIES)
PADDING CAST ABS COTTON 4X4 ST (CAST SUPPLIES) IMPLANT
PADDING CAST COTTON 3X4 STRL (CAST SUPPLIES) ×2
SHEET MEDIUM DRAPE 40X70 STRL (DRAPES) ×2 IMPLANT
SLEEVE SCD COMPRESS KNEE MED (MISCELLANEOUS) IMPLANT
SLING ARM FOAM STRAP LRG (SOFTGOODS) ×2 IMPLANT
SLING ARM FOAM STRAP XLG (SOFTGOODS) ×2 IMPLANT
STOCKINETTE 4X48 STRL (DRAPES) ×2 IMPLANT
STRIP CLOSURE SKIN 1/4X4 (GAUZE/BANDAGES/DRESSINGS) IMPLANT
SUCTION FRAZIER HANDLE 10FR (MISCELLANEOUS)
SUCTION TUBE FRAZIER 10FR DISP (MISCELLANEOUS) IMPLANT
SUT ETHILON 4 0 PS 2 18 (SUTURE) ×4 IMPLANT
SUT VIC AB 2-0 CT1 27 (SUTURE)
SUT VIC AB 2-0 CT1 TAPERPNT 27 (SUTURE) IMPLANT
SUT VIC AB 3-0 SH 27 (SUTURE) ×4
SUT VIC AB 3-0 SH 27X BRD (SUTURE) ×2 IMPLANT
SUT VIC AB 4-0 P-3 18XBRD (SUTURE) IMPLANT
SUT VIC AB 4-0 P2 18 (SUTURE) IMPLANT
SUT VIC AB 4-0 P3 18 (SUTURE)
SUT VICRYL 0 CT-2 (SUTURE) ×2 IMPLANT
SYR BULB EAR ULCER 3OZ GRN STR (SYRINGE) ×2 IMPLANT
SYR CONTROL 10ML LL (SYRINGE) IMPLANT
TOWEL GREEN STERILE FF (TOWEL DISPOSABLE) ×4 IMPLANT
TRAY DSU PREP LF (CUSTOM PROCEDURE TRAY) ×2 IMPLANT
UNDERPAD 30X36 HEAVY ABSORB (UNDERPADS AND DIAPERS) ×2 IMPLANT

## 2020-10-28 NOTE — Transfer of Care (Signed)
Immediate Anesthesia Transfer of Care Note  Patient: Gregory Allen  Procedure(s) Performed: RIGHT CUBITAL TUNNEL RELEASE, TRANSPOSITION (Right Elbow)  Patient Location: PACU  Anesthesia Type:MAC  Level of Consciousness: awake, alert , oriented and patient cooperative  Airway & Oxygen Therapy: Patient Spontanous Breathing  Post-op Assessment: Report given to RN, Post -op Vital signs reviewed and stable and Patient moving all extremities  Post vital signs: Reviewed and stable  Last Vitals:  Vitals Value Taken Time  BP    Temp    Pulse    Resp    SpO2      Last Pain: There were no vitals filed for this visit.       Complications: No complications documented.

## 2020-10-28 NOTE — H&P (Signed)
PREOPERATIVE H&P  Chief Complaint: right cubital tunnel syndrome  HPI: Gregory Allen is a 49 y.o. male who presents for surgical treatment of right cubital tunnel syndrome.  He denies any changes in medical history.  Past Medical History:  Diagnosis Date  . Arthritis    left knee  . Diabetes mellitus without complication ([place])    no meds since pancreatic transplant 2018  . Hypertension   . Kidney transplant recipient 2018  . PAD (peripheral artery disease) ([place])   . 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 Peripheral neuropathy    Past Surgical History:  Procedure Laterality Date  . ABDOMINAL AORTOGRAM W/LOWER EXTREMITY Bilateral 06/12/2020   Procedure: ABDOMINAL AORTOGRAM W/LOWER EXTREMITY;  Surgeon: Elam Dutch, MD;  Location: [facility];  Service: Cardiovascular;  Laterality: Bilateral;  Bilateral   . ANKLE SURGERY Right   . BACK SURGERY    . FOOT SURGERY Left   . HAND SURGERY Right    thumb  . KIDNEY TRANSPLANT    . TRANSPLANT PANCREATIC ALLOGRAFT     Social History   Socioeconomic History  . Marital status: Married    Spouse name: Not on file  . Number of children: 2  . Years of education: 72  . Highest education level: Not on file  Occupational History  . Occupation: Disabled   Tobacco Use  . Smoking status: Never Smoker  . Smokeless tobacco: Never Used  Vaping Use  . Vaping Use: Never used  Substance and Sexual Activity  . Alcohol use: No    Alcohol/week: 0.0 standard drinks  . Drug use: No  . Sexual activity: Yes  Other Topics Concern  . Not on file  Social History Narrative   Right handed    Coffee daily   Social Determinants of Health   Financial Resource Strain: Not on file  Food Insecurity: Not on file  Transportation Needs: Not on file  Physical Activity: Not on file  Stress: Not on file  Social Connections: Not on file   Family History  Problem Relation Age of Onset  . Diabetes Mother   . Hypertension Mother    . Diabetes Father   . Hypertension Father    Allergies  Allergen Reactions  . Shellfish-Derived Products Itching    "throat starts itching" pt states "he is ok with iodine"  . Ivp Dye [Iodinated Diagnostic Agents]     Has had kidney transplant   Prior to Admission medications   Medication Sig Start Date End Date Taking? Authorizing Provider  acetaminophen (TYLENOL) 500 MG tablet Take 1,000 mg by mouth every 6 (six) hours as needed for moderate pain or headache.   Yes [provider]  amLODipine (NORVASC) 10 MG tablet Take 10 mg by mouth daily.  05/14/18  Yes [provider]  aspirin EC 81 MG tablet Take 81 mg by mouth daily.  04/14/17  Yes [provider]  Capsaicin 0.1 % CREA Apply 1 application topically daily as needed (pain).   Yes [provider]  carvedilol (COREG) 12.5 MG tablet Take 12.5 mg by mouth 2 (two) times daily with a meal.   Yes [provider]  gabapentin (NEURONTIN) 300 MG capsule Take 600 mg by mouth 2 (two) times daily.   Yes [provider]  Magnesium 400 MG TABS Take 400 mg by mouth daily.   Yes [provider]  methocarbamol (ROBAXIN) 500 MG tablet Take 500 mg by mouth 2 (two) times daily.  05/25/20  Yes [provider]  mycophenolate (MYFORTIC) 180 MG EC tablet Take 1 tablet (180 mg total) by mouth 2 (two) times daily. Patient taking differently: Take 360 mg by mouth 2 (two) times daily. 06/03/19  Yes Tresa Garter, MD  pentoxifylline (TRENTAL) 400 MG CR tablet Take 400 mg by mouth 2 (two) times daily.   Yes [provider]  predniSONE (DELTASONE) 5 MG tablet Take 5 mg by mouth 2 (two) times daily.   Yes [provider]  rosuvastatin (CRESTOR) 5 MG tablet Take 5 mg by mouth every Monday, Wednesday, and Friday.   Yes [provider]  sulfamethoxazole-trimethoprim (BACTRIM,SEPTRA) 400-80 MG tablet Take 1 tablet by mouth every Monday, Wednesday, and Friday.  04/30/18   Yes [provider]  tacrolimus (PROGRAF) 1 MG capsule Take 4 mg by mouth 2 (two) times daily.  02/15/18  Yes [provider]     Positive ROS: All other systems have been reviewed and were otherwise negative with the exception of those mentioned in the HPI and as above.  Physical Exam: General: Alert, no acute distress Cardiovascular: No pedal edema Respiratory: No cyanosis, no use of accessory musculature GI: abdomen soft Skin: No lesions in the area of chief complaint Neurologic: Sensation intact distally Psychiatric: Patient is competent for consent with normal mood and affect Lymphatic: no lymphedema  MUSCULOSKELETAL: exam stable  Assessment: right cubital tunnel syndrome  Plan: Plan for Procedure(s): RIGHT CUBITAL TUNNEL RELEASE, TRANSPOSITION  The risks benefits and alternatives were discussed with the patient including but not limited to the risks of nonoperative treatment, versus surgical intervention including infection, bleeding, nerve injury,  blood clots, cardiopulmonary complications, morbidity, mortality, among others, and they were willing to proceed.   Preoperative templating of the joint replacement has been completed, documented, and submitted to the Operating Room personnel in order to optimize intra-operative equipment management.   Eduard Roux, MD 10/28/2020 11:47 AM

## 2020-10-28 NOTE — Discharge Instructions (Signed)
Regional Anesthesia Blocks  1. Numbness or the inability to move the "blocked" extremity may last from 3-48 hours after placement. The length of time depends on the medication injected and your individual response to the medication. If the numbness is not going away after 48 hours, call your surgeon.  2. The extremity that is blocked will need to be protected until the numbness is gone and the  Strength has returned. Because you cannot feel it, you will need to take extra care to avoid injury. Because it may be weak, you may have difficulty moving it or using it. You may not know what position it is in without looking at it while the block is in effect.  3. For blocks in the legs and feet, returning to weight bearing and walking needs to be done carefully. You will need to wait until the numbness is entirely gone and the strength has returned. You should be able to move your leg and foot normally before you try and bear weight or walk. You will need someone to be with you when you first try to ensure you do not fall and possibly risk injury.  4. Bruising and tenderness at the needle site are common side effects and will resolve in a few days.  5. Persistent numbness or new problems with movement should be communicated to the surgeon or the [place] [phone] [place] [phone]).    Post Anesthesia Home Care Instructions  Activity: Get plenty of rest for the remainder of the day. A responsible individual must stay with you for 24 hours following the procedure.  For the next 24 hours, DO NOT: -Drive a car -Paediatric nurse -Drink alcoholic beverages -Take any medication unless instructed by your physician -Make any legal decisions or sign important papers.  Meals: Start with liquid foods such as gelatin or soup. Progress to regular foods as tolerated. Avoid greasy, spicy, heavy foods. If nausea and/or vomiting occur, drink only clear liquids until the  nausea and/or vomiting subsides. Call your physician if vomiting continues.  Special Instructions/Symptoms: Your throat may feel dry or sore from the anesthesia or the breathing tube placed in your throat during surgery. If this causes discomfort, gargle with warm salt water. The discomfort should disappear within 24 hours.  If you had a scopolamine patch placed behind your ear for the management of post- operative nausea and/or vomiting:  1. The medication in the patch is effective for 72 hours, after which it should be removed.  Wrap patch in a tissue and discard in the trash. Wash hands thoroughly with soap and water. 2. You may remove the patch earlier than 72 hours if you experience unpleasant side effects which may include dry mouth, dizziness or visual disturbances. 3. Avoid touching the patch. Wash your hands with soap and water after contact with the patch.         Postoperative instructions:  Weightbearing instructions: as tolerated  Keep your dressing and/or splint clean and dry at all times.  You can remove your dressing on post-operative day #3 and change with a dry/sterile dressing or Band-Aids as needed thereafter.    Incision instructions:  Do not soak your incision for 3 weeks after surgery.  If the incision gets wet, pat dry and do not scrub the incision.  Pain control:  You have been given a prescription to be taken as directed for post-operative pain control.  In addition, elevate the operative extremity above the heart at all  times to prevent swelling and throbbing pain.  Take over-the-counter Colace, 100mg  by mouth twice a day while taking narcotic pain medications to help prevent constipation.  Follow up appointments: 1) 10 days for suture removal and wound check. 2) Dr. Erlinda Hong as scheduled.   -------------------------------------------------------------------------------------------------------------  After Surgery Pain Control:  After your surgery,  post-surgical discomfort or pain is likely. This discomfort can last several days to a few weeks. At certain times of the day your discomfort may be more intense.  Did you receive a nerve block?  A nerve block can provide pain relief for one hour to two days after your surgery. As long as the nerve block is working, you will experience little or no sensation in the area the surgeon operated on.  As the nerve block wears off, you will begin to experience pain or discomfort. It is very important that you begin taking your prescribed pain medication before the nerve block fully wears off. Treating your pain at the first sign of the block wearing off will ensure your pain is better controlled and more tolerable when full-sensation returns. Do not wait until the pain is intolerable, as the medicine will be less effective. It is better to treat pain in advance than to try and catch up.  General Anesthesia:  If you did not receive a nerve block during your surgery, you will need to start taking your pain medication shortly after your surgery and should continue to do so as prescribed by your surgeon.  Pain Medication:  Most commonly we prescribe Vicodin and Percocet for post-operative pain. Both of these medications contain a combination of acetaminophen (Tylenol) and a narcotic to help control pain.   It takes between 30 and 45 minutes before pain medication starts to work. It is important to take your medication before your pain level gets too intense.   Nausea is a common side effect of many pain medications. You will want to eat something before taking your pain medicine to help prevent nausea.   If you are taking a prescription pain medication that contains acetaminophen, we recommend that you do not take additional over the counter acetaminophen (Tylenol).  Other pain relieving options:   Using a cold pack to ice the affected area a few times a day (15 to 20 minutes at a time) can help to relieve  pain, reduce swelling and bruising.   Elevation of the affected area can also help to reduce pain and swelling.

## 2020-10-28 NOTE — Progress Notes (Signed)
Assisted Dr. Miller with right, ultrasound guided, supraclavicular block. Side rails up, monitors on throughout procedure. See vital signs in flow sheet. Tolerated Procedure well. 

## 2020-10-28 NOTE — Anesthesia Procedure Notes (Signed)
Anesthesia Regional Block: Supraclavicular block   Pre-Anesthetic Checklist: ,, timeout performed, Correct Patient, Correct Site, Correct Laterality, Correct Procedure, Correct Position, site marked, Risks and benefits discussed,  Surgical consent,  Pre-op evaluation,  At surgeon's request and post-op pain management  Laterality: Right  Prep: chloraprep       Needles:  Injection technique: Single-shot     Needle Length: 9cm  Needle Gauge: 21     Additional Needles:   Procedures:,,,, ultrasound used (permanent image in chart),,,,  Narrative:  Start time: 10/28/2020 12:45 PM End time: 10/28/2020 12:50 PM Injection made incrementally with aspirations every 5 mL.  Performed by: Personally  Anesthesiologist: Lynda Rainwater, MD

## 2020-10-28 NOTE — Anesthesia Preprocedure Evaluation (Signed)
Anesthesia Evaluation  Patient identified by MRN, date of birth, ID band Patient awake    Reviewed: Allergy & Precautions, H&P , NPO status , Patient's Chart, lab work & pertinent test results  Airway Mallampati: II  TM Distance: >3 FB Neck ROM: Full    Dental no notable dental hx.    Pulmonary neg pulmonary ROS,    Pulmonary exam normal breath sounds clear to auscultation       Cardiovascular hypertension, Pt. on medications negative cardio ROS Normal cardiovascular exam Rhythm:Regular Rate:Normal     Neuro/Psych negative neurological ROS  negative psych ROS   GI/Hepatic negative GI ROS, Neg liver ROS,   Endo/Other  negative endocrine ROS  Renal/GU negative Renal ROS  negative genitourinary   Musculoskeletal  (+) Arthritis , Osteoarthritis,    Abdominal   Peds negative pediatric ROS (+)  Hematology negative hematology ROS (+)   Anesthesia Other Findings   Reproductive/Obstetrics negative OB ROS                             Anesthesia Physical Anesthesia Plan  ASA: II  Anesthesia Plan: MAC and Regional   Post-op Pain Management:    Induction: Intravenous  PONV Risk Score and Plan: 1 and Ondansetron and Treatment may vary due to age or medical condition  Airway Management Planned: Simple Face Mask  Additional Equipment:   Intra-op Plan:   Post-operative Plan:   Informed Consent: I have reviewed the patients History and Physical, chart, labs and discussed the procedure including the risks, benefits and alternatives for the proposed anesthesia with the patient or authorized representative who has indicated his/her understanding and acceptance.     Dental advisory given  Plan Discussed with: CRNA  Anesthesia Plan Comments:         Anesthesia Quick Evaluation

## 2020-10-28 NOTE — Op Note (Addendum)
   DATE OF SURGERY: 10/28/2020  PREOPERATIVE DIAGNOSIS: Severe right cubital tunnel syndrome  POSTOPERATIVE DIAGNOSIS: same.  PROCEDURE: Right  ulnar nerve neurolysis at elbow.  CPT 41740  SURGEON: Frankey Shown, M.D.  ASSIST: Ciro Backer [place], [place]; necessary for the timely completion of procedure and due to complexity of procedure.  ANESTHESIA: Regional  TOURNIQUET TIME: 30 minutes at 250 mm Hg  BLOOD LOSS: Minimal.  COMPLICATIONS: None.  PATHOLOGY: None.  TIME OUT: Performed prior to start of procedure.  INDICATIONS: The patient was a 49 y.o. male who presented with cubital tunnel syndrome failing nonsurgical managements, indicated for surgery.  DESCRIPTION OF PROCEDURE: The patient was identified in the preoperative holding area.  The operative site was marked by the surgeon and confirmed by the patient.  He was brought back to the operating room.  Anesthesia was induced by the anesthesia team.  A well padded nonsterile tourniquet was placed. The operative extremity was prepped and draped in standard sterile fashion. A medial elbow incision was made in between the medial epicondyle and olecranon. The medial antebrachial cutaneous nerve was exposed and retracted and protected. The ulnar nerve was identified in between the medial intermuscular septum and medial head of triceps. The fascia crossing the medial head of the triceps and intermuscular septum was released under direct visualization about 10 cm proximally to the elbow. Distally, Osborne's ligament was divided from its posterior edgedistally. We followed the ulnar nerve distally. The fascia of the flexor carpi ulnaris was divided and deep aponeurosis was also released. Anterior transposition of the ulnar nerve was performed.  A fascial sling was created from the flexor pronator mass to prevent subluxation of the ulnar nerve posteriorly.  At this point, local infiltration with 0.25% of Sensorcaine was given. The tourniquet was  deflated.  Hemostasis was achieved. The wound was irrigated and closed using 4-0 nylon sutures. Sterile dressing applied. The patient was transferred to the recovery room in stable condition after all counts were correct.  Tawanna Cooler was crucial for the timely and safe completion of the procedure including opening, closing, exposing, retracting, limb positioning and overall facilitation of the surgery.  POSTOPERATIVE PLAN: To start nerve gliding exercises, avoid heavy lifting for four weeks.

## 2020-10-28 NOTE — Anesthesia Postprocedure Evaluation (Signed)
Anesthesia Post Note  Patient: Auther Lyerly  Procedure(s) Performed: RIGHT CUBITAL TUNNEL RELEASE, TRANSPOSITION (Right Elbow)     Patient location during evaluation: PACU Anesthesia Type: Regional Level of consciousness: awake and alert Pain management: pain level controlled Vital Signs Assessment: post-procedure vital signs reviewed and stable Respiratory status: spontaneous breathing, nonlabored ventilation and respiratory function stable Cardiovascular status: blood pressure returned to baseline and stable Postop Assessment: no apparent nausea or vomiting Anesthetic complications: no   No complications documented.  Last Vitals:  Vitals:   10/28/20 1245 10/28/20 1443  BP: 107/76 117/79  Pulse: 76 70  Resp: 16 17  Temp:  36.5 C  SpO2: 100% 98%    Last Pain: There were no vitals filed for this visit.               Lynda Rainwater

## 2020-10-29 ENCOUNTER — Encounter (HOSPITAL_BASED_OUTPATIENT_CLINIC_OR_DEPARTMENT_OTHER): Payer: Self-pay | Admitting: Orthopaedic Surgery

## 2020-10-29 NOTE — Addendum Note (Signed)
Addendum  created 10/29/20 0233 by Lavonia Dana, CRNA   Charge Capture section accepted

## 2020-11-02 ENCOUNTER — Telehealth: Payer: Self-pay

## 2020-11-02 NOTE — Telephone Encounter (Signed)
Please advise. Patient said that you did not want him to return until 11/17/2019.

## 2020-11-02 NOTE — Telephone Encounter (Signed)
Patient called he has a appointment scheduled for 1/4 he stated Dr.Xu told him to come around 1/10 he needs to know which appointment he needs to have scheduled. [phone]

## 2020-11-02 NOTE — Telephone Encounter (Signed)
F/u 11/11/19.  Thanks.

## 2020-11-03 DIAGNOSIS — Z006 Encounter for examination for normal comparison and control in clinical research program: Secondary | ICD-10-CM | POA: Diagnosis not present

## 2020-11-03 MED FILL — AMLODIPINE BESYLATE 10 MG T: 10 | 90 days supply | Qty: 90 | Fill #0

## 2020-11-03 MED FILL — predniSONE 5 MG TABS: 5 | 30 days supply | Qty: 60 | Fill #2

## 2020-11-03 NOTE — Telephone Encounter (Signed)
I called patient and advised. 

## 2020-11-09 ENCOUNTER — Other Ambulatory Visit (HOSPITAL_COMMUNITY): Payer: Self-pay | Admitting: Nephrology

## 2020-11-09 DIAGNOSIS — Z9483 Pancreas transplant status: Secondary | ICD-10-CM | POA: Diagnosis not present

## 2020-11-09 DIAGNOSIS — Z940 Kidney transplant status: Secondary | ICD-10-CM | POA: Diagnosis not present

## 2020-11-09 DIAGNOSIS — D849 Immunodeficiency, unspecified: Secondary | ICD-10-CM | POA: Diagnosis not present

## 2020-11-09 MED FILL — CARVEDILOL 12.5 MG TABLET: 12.5 | 90 days supply | Qty: 180 | Fill #0

## 2020-11-09 MED FILL — MYCOPHENOLATE SODIUM 180 MG: 180 | 30 days supply | Qty: 120 | Fill #4

## 2020-11-09 MED FILL — METHOCARBAMOL 500 MG TABLET: 500 | 30 days supply | Qty: 60 | Fill #1

## 2020-11-10 ENCOUNTER — Other Ambulatory Visit: Payer: Self-pay | Admitting: Physician Assistant

## 2020-11-10 ENCOUNTER — Ambulatory Visit (INDEPENDENT_AMBULATORY_CARE_PROVIDER_SITE_OTHER): Payer: 59 | Admitting: Physician Assistant

## 2020-11-10 ENCOUNTER — Encounter: Payer: Self-pay | Admitting: Orthopaedic Surgery

## 2020-11-10 DIAGNOSIS — G5621 Lesion of ulnar nerve, right upper limb: Secondary | ICD-10-CM

## 2020-11-10 MED ORDER — HYDROCODONE-ACETAMINOPHEN 5-325 MG PO TABS: 1.0000 | ORAL_TABLET | Freq: Two times a day (BID) | ORAL | 0 refills | Status: DC | PRN

## 2020-11-10 MED FILL — HYDROCODON-APAP 5-325: 5-325 | 5 days supply | Qty: 20 | Fill #0

## 2020-11-10 NOTE — Progress Notes (Signed)
Post-Op Visit Note   Patient: Gregory Allen           Date of Birth: August 31, 1971           MRN: [id number] Visit Date: 11/10/2020 PCP: Velna Hatchet, MD   Assessment & Plan:  Chief Complaint:  Chief Complaint  Patient presents with  . Right Elbow - Pain   Visit Diagnoses:  1. Cubital tunnel syndrome on right     Plan: Patient is a pleasant 50 year old gentleman who comes in today nearly 2 weeks out right cubital tunnel release.  He has been doing well.  He has some pain and soreness to the forearm but nothing more.  He denies any numbness or tingling.  No fevers or chills.  Examination of his right elbow reveals a well-healing surgical incision without evidence of infection or cellulitis.  He is neurovascularly intact distally.  Today, sutures were removed and Steri-Strips applied.  He will limit heavy lifting for another 2 weeks until he is 4 weeks out from surgery.  Follow-up with [place] in 4 weeks time for repeat evaluation.  At that point time he would like to go ahead and schedule to have his left cubital tunnel released.  Call with concerns or questions in the meantime.  Follow-Up Instructions: Return in about 4 weeks (around 12/08/2020).   Orders:  No orders of the defined types were placed in this encounter.  No orders of the defined types were placed in this encounter.   Imaging: No new imaging  PMFS History: Patient Active Problem List   Diagnosis Date Noted  . Cubital tunnel syndrome on right 10/16/2020  . Cubital tunnel syndrome on left 10/16/2020  . Numbness 09/15/2020  . Ulnar neuropathy of both upper extremities 09/15/2020  . BP (high blood pressure) 11/19/2015  . History of organ or tissue transplant 05/12/2015  . Essential (primary) hypertension 05/12/2015  . Combined fat and carbohydrate induced hyperlipemia 05/12/2015  . Type 1 diabetes mellitus with hyperglycemia ([place]) 05/12/2015  . Arthritis of hand, degenerative 09/08/2014  . Hypophosphatemia  04/24/2012  . Carpal bone fracture 01/17/2012  . Tarsal tunnel syndrome 01/17/2012  . Nonproliferative diabetic retinopathy ([place]) 02/08/2011  . Type 1 diabetes mellitus ([place]) 02/08/2011  . Diabetic polyneuropathy associated with type 1 diabetes mellitus ([place]) 02/08/2011  . Abnormal presence of protein in urine 02/08/2011   Past Medical History:  Diagnosis Date  . Arthritis    left knee  . Diabetes mellitus without complication ([place])    no meds since pancreatic transplant 2018  . Hypertension   . Kidney transplant recipient 2018  . PAD (peripheral artery disease) ([place])   . 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 Peripheral neuropathy     Family History  Problem Relation Age of Onset  . Diabetes Mother   . Hypertension Mother   . Diabetes Father   . Hypertension Father     Past Surgical History:  Procedure Laterality Date  . ABDOMINAL AORTOGRAM W/LOWER EXTREMITY Bilateral 06/12/2020   Procedure: ABDOMINAL AORTOGRAM W/LOWER EXTREMITY;  Surgeon: Elam Dutch, MD;  Location: [facility];  Service: Cardiovascular;  Laterality: Bilateral;  Bilateral   . ANKLE SURGERY Right   . BACK SURGERY    . FOOT SURGERY Left   . HAND SURGERY Right    thumb  . KIDNEY TRANSPLANT    . TRANSPLANT PANCREATIC ALLOGRAFT    . ULNAR TUNNEL RELEASE Right 10/28/2020   Procedure: RIGHT CUBITAL TUNNEL RELEASE, TRANSPOSITION;  Surgeon: Frankey Shown  M, MD;  Location: [facility];  Service: Orthopedics;  Laterality: Right;   Social History   Occupational History  . Occupation: Disabled   Tobacco Use  . Smoking status: Never Smoker  . Smokeless tobacco: Never Used  Vaping Use  . Vaping Use: Never used  Substance and Sexual Activity  . Alcohol use: No    Alcohol/week: 0.0 standard drinks  . Drug use: No  . Sexual activity: Yes

## 2020-11-17 ENCOUNTER — Telehealth: Payer: Self-pay

## 2020-11-17 NOTE — Telephone Encounter (Signed)
Patient called he stated he just had surgery and he is having pain in his right hand and in his forearm he wants to know if he should be concerned or if its normal. [phone]

## 2020-11-17 NOTE — Telephone Encounter (Signed)
You mind calling the patient to see specifically what is going on?  Thank you.

## 2020-11-18 NOTE — Telephone Encounter (Signed)
Called and spoke to patient.

## 2020-11-18 NOTE — Telephone Encounter (Signed)
Called patient states he has pain in his elbow  . Feels like something is stretching. Taking hydrocodone. Would like to know if this is normal.

## 2020-11-23 MED FILL — TACROLIMUS 1 MG CAPSULE: 1 | 30 days supply | Qty: 240 | Fill #5

## 2020-12-08 ENCOUNTER — Ambulatory Visit: Payer: 59 | Admitting: Orthopaedic Surgery

## 2020-12-10 ENCOUNTER — Ambulatory Visit: Payer: 59 | Admitting: Physician Assistant

## 2020-12-10 DIAGNOSIS — Z9483 Pancreas transplant status: Secondary | ICD-10-CM | POA: Diagnosis not present

## 2020-12-10 DIAGNOSIS — D849 Immunodeficiency, unspecified: Secondary | ICD-10-CM | POA: Diagnosis not present

## 2020-12-10 DIAGNOSIS — Z940 Kidney transplant status: Secondary | ICD-10-CM | POA: Diagnosis not present

## 2020-12-10 MED FILL — MYCOPHENOLATE SODIUM 180 MG: 180 | 30 days supply | Qty: 120 | Fill #5

## 2020-12-10 MED FILL — predniSONE 5 MG TABS: 5 | 30 days supply | Qty: 60 | Fill #3

## 2020-12-10 MED FILL — ROSUVASTATIN CALCIUM 5 MG T: 5 | 84 days supply | Qty: 36 | Fill #3

## 2020-12-11 ENCOUNTER — Ambulatory Visit: Payer: 59 | Admitting: Physician Assistant

## 2020-12-17 ENCOUNTER — Ambulatory Visit (INDEPENDENT_AMBULATORY_CARE_PROVIDER_SITE_OTHER): Payer: Medicare Other | Admitting: Orthopaedic Surgery

## 2020-12-17 ENCOUNTER — Encounter: Payer: Self-pay | Admitting: Orthopaedic Surgery

## 2020-12-17 DIAGNOSIS — Z9889 Other specified postprocedural states: Secondary | ICD-10-CM

## 2020-12-17 DIAGNOSIS — G5623 Lesion of ulnar nerve, bilateral upper limbs: Secondary | ICD-10-CM

## 2020-12-17 MED FILL — METHOCARBAMOL 500 MG TABS: 500 | 30 days supply | Qty: 60 | Fill #2

## 2020-12-17 NOTE — Progress Notes (Signed)
Post-Op Visit Note   Patient: Gregory Allen           Date of Birth: September 19, 1971           MRN: [id number] Visit Date: 12/17/2020 PCP: Velna Hatchet, MD   Assessment & Plan:  Chief Complaint:  Chief Complaint  Patient presents with  . Right Elbow - Routine Post Op, Pain, Follow-up    S/P Right cubital tunnel release-10/28/2020   Visit Diagnoses:  1. Cubital tunnel syndrome, bilateral   2. S/P cubital tunnel release     Plan: Patient is a pleasant 50 year old gentleman who comes in today 6 weeks out right cubital tunnel release.  He has been doing fairly well.  No numbness, tingling or burning.  He does note occasional sharp pain to the elbow as well as tenderness to the incision.  Examination of his elbow reveals a fully healed surgical scar without complication.  Full extension and flexion.  He is neurovascular intact distally.  At this point, I recommended scar desensitization with lotion.  He will continue to advance with activity as tolerated.  Follow-up with [place] as needed.  Follow-Up Instructions: Return if symptoms worsen or fail to improve.   Orders:  No orders of the defined types were placed in this encounter.  No orders of the defined types were placed in this encounter.   Imaging: No results found.  PMFS History: Patient Active Problem List   Diagnosis Date Noted  . Cubital tunnel syndrome on right 10/16/2020  . Cubital tunnel syndrome on left 10/16/2020  . Numbness 09/15/2020  . Ulnar neuropathy of both upper extremities 09/15/2020  . BP (high blood pressure) 11/19/2015  . History of organ or tissue transplant 05/12/2015  . Essential (primary) hypertension 05/12/2015  . Combined fat and carbohydrate induced hyperlipemia 05/12/2015  . Type 1 diabetes mellitus with hyperglycemia ([place]) 05/12/2015  . Arthritis of hand, degenerative 09/08/2014  . Hypophosphatemia 04/24/2012  . Carpal bone fracture 01/17/2012  . Tarsal tunnel syndrome 01/17/2012  .  Nonproliferative diabetic retinopathy ([place]) 02/08/2011  . Type 1 diabetes mellitus ([place]) 02/08/2011  . Diabetic polyneuropathy associated with type 1 diabetes mellitus ([place]) 02/08/2011  . Abnormal presence of protein in urine 02/08/2011   Past Medical History:  Diagnosis Date  . Arthritis    left knee  . Diabetes mellitus without complication ([place])    no meds since pancreatic transplant 2018  . Hypertension   . Kidney transplant recipient 2018  . PAD (peripheral artery disease) ([place])   . 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 Peripheral neuropathy     Family History  Problem Relation Age of Onset  . Diabetes Mother   . Hypertension Mother   . Diabetes Father   . Hypertension Father     Past Surgical History:  Procedure Laterality Date  . ABDOMINAL AORTOGRAM W/LOWER EXTREMITY Bilateral 06/12/2020   Procedure: ABDOMINAL AORTOGRAM W/LOWER EXTREMITY;  Surgeon: Elam Dutch, MD;  Location: [facility];  Service: Cardiovascular;  Laterality: Bilateral;  Bilateral   . ANKLE SURGERY Right   . BACK SURGERY    . FOOT SURGERY Left   . HAND SURGERY Right    thumb  . KIDNEY TRANSPLANT    . TRANSPLANT PANCREATIC ALLOGRAFT    . ULNAR TUNNEL RELEASE Right 10/28/2020   Procedure: RIGHT CUBITAL TUNNEL RELEASE, TRANSPOSITION;  Surgeon: Leandrew Koyanagi, MD;  Location: [facility];  Service: Orthopedics;  Laterality: Right;   Social History   Occupational History  .  Occupation: Disabled   Tobacco Use  . Smoking status: Never Smoker  . Smokeless tobacco: Never Used  Vaping Use  . Vaping Use: Never used  Substance and Sexual Activity  . Alcohol use: No    Alcohol/week: 0.0 standard drinks  . Drug use: No  . Sexual activity: Yes

## 2020-12-28 ENCOUNTER — Other Ambulatory Visit (HOSPITAL_COMMUNITY): Payer: Self-pay | Admitting: Nephrology

## 2020-12-28 MED FILL — TACROLIMUS 1 MG CAPSULE: 1 | 30 days supply | Qty: 240 | Fill #0

## 2021-01-04 DIAGNOSIS — D849 Immunodeficiency, unspecified: Secondary | ICD-10-CM | POA: Diagnosis not present

## 2021-01-04 DIAGNOSIS — Z9483 Pancreas transplant status: Secondary | ICD-10-CM | POA: Diagnosis not present

## 2021-01-04 DIAGNOSIS — Z940 Kidney transplant status: Secondary | ICD-10-CM | POA: Diagnosis not present

## 2021-01-05 ENCOUNTER — Other Ambulatory Visit (HOSPITAL_COMMUNITY): Payer: Self-pay | Admitting: Nephrology

## 2021-01-07 ENCOUNTER — Other Ambulatory Visit (HOSPITAL_COMMUNITY): Payer: Self-pay | Admitting: Nephrology

## 2021-01-07 MED FILL — MYCOPHENOLATE SODIUM 180 MG: 180 | 30 days supply | Qty: 150 | Fill #0

## 2021-01-13 DIAGNOSIS — E1169 Type 2 diabetes mellitus with other specified complication: Secondary | ICD-10-CM | POA: Diagnosis not present

## 2021-01-13 DIAGNOSIS — M25521 Pain in right elbow: Secondary | ICD-10-CM | POA: Diagnosis not present

## 2021-01-25 MED FILL — SULFAMETHOXAZOLE-TMP SS TAB: 400-80 | 84 days supply | Qty: 36 | Fill #2

## 2021-01-25 MED FILL — TACROLIMUS 1 MG CAPSULE: 1 | 30 days supply | Qty: 240 | Fill #1

## 2021-01-26 ENCOUNTER — Other Ambulatory Visit (HOSPITAL_COMMUNITY): Payer: Self-pay | Admitting: Internal Medicine

## 2021-01-26 MED FILL — METHOCARBAMOL 500 MG TABS: 500 | 30 days supply | Qty: 60 | Fill #0

## 2021-02-01 DIAGNOSIS — Z9483 Pancreas transplant status: Secondary | ICD-10-CM | POA: Diagnosis not present

## 2021-02-01 DIAGNOSIS — Z79899 Other long term (current) drug therapy: Secondary | ICD-10-CM | POA: Diagnosis not present

## 2021-02-01 DIAGNOSIS — Z940 Kidney transplant status: Secondary | ICD-10-CM | POA: Diagnosis not present

## 2021-02-01 DIAGNOSIS — Z48288 Encounter for aftercare following multiple organ transplant: Secondary | ICD-10-CM | POA: Diagnosis not present

## 2021-02-01 MED FILL — AMLODIPINE BESYLATE 10 MG T: 10 | 90 days supply | Qty: 90 | Fill #1

## 2021-02-01 MED FILL — CARVEDILOL 12.5 MG TABLET: 12.5 | 90 days supply | Qty: 180 | Fill #1

## 2021-02-06 ENCOUNTER — Other Ambulatory Visit (HOSPITAL_COMMUNITY): Payer: Self-pay

## 2021-02-06 MED FILL — Mycophenolate Sodium Tab DR 180 MG (Mycophenolic Acid Equiv): ORAL | 30 days supply | Qty: 150 | Fill #0 | Status: AC

## 2021-02-07 ENCOUNTER — Other Ambulatory Visit (HOSPITAL_COMMUNITY): Payer: Self-pay

## 2021-02-08 ENCOUNTER — Other Ambulatory Visit (HOSPITAL_COMMUNITY): Payer: Self-pay

## 2021-02-09 ENCOUNTER — Other Ambulatory Visit (HOSPITAL_COMMUNITY): Payer: Self-pay

## 2021-02-19 ENCOUNTER — Other Ambulatory Visit (HOSPITAL_COMMUNITY): Payer: Self-pay

## 2021-02-23 ENCOUNTER — Other Ambulatory Visit (HOSPITAL_COMMUNITY): Payer: Self-pay

## 2021-02-23 MED FILL — Tacrolimus Cap 1 MG: ORAL | 30 days supply | Qty: 240 | Fill #0 | Status: AC

## 2021-02-23 MED FILL — Rosuvastatin Calcium Tab 5 MG: ORAL | 84 days supply | Qty: 36 | Fill #0 | Status: CN

## 2021-02-24 ENCOUNTER — Other Ambulatory Visit (HOSPITAL_COMMUNITY): Payer: Self-pay

## 2021-02-25 ENCOUNTER — Other Ambulatory Visit (HOSPITAL_COMMUNITY): Payer: Self-pay

## 2021-02-26 ENCOUNTER — Other Ambulatory Visit (HOSPITAL_COMMUNITY): Payer: Self-pay

## 2021-02-26 MED ORDER — ROSUVASTATIN CALCIUM 5 MG PO TABS
ORAL_TABLET | ORAL | 3 refills | Status: DC
  Filled 2021-05-25: qty 36, 84d supply, fill #0

## 2021-02-26 MED ORDER — PREDNISONE 5 MG PO TABS
5.0000 mg | ORAL_TABLET | Freq: Every day | ORAL | 3 refills | Status: DC
  Filled 2021-02-26: qty 90, 90d supply, fill #0
  Filled 2021-05-23: qty 90, 90d supply, fill #1

## 2021-02-26 MED FILL — Methocarbamol Tab 500 MG: ORAL | 30 days supply | Qty: 60 | Fill #0 | Status: AC

## 2021-02-26 MED FILL — Rosuvastatin Calcium Tab 5 MG: ORAL | 84 days supply | Qty: 36 | Fill #0 | Status: CN

## 2021-02-26 MED FILL — Rosuvastatin Calcium Tab 5 MG: ORAL | 90 days supply | Qty: 36 | Fill #0 | Status: CN

## 2021-02-27 ENCOUNTER — Other Ambulatory Visit (HOSPITAL_COMMUNITY): Payer: Self-pay

## 2021-02-27 MED FILL — Rosuvastatin Calcium Tab 5 MG: ORAL | 84 days supply | Qty: 36 | Fill #0 | Status: AC

## 2021-03-01 DIAGNOSIS — E1140 Type 2 diabetes mellitus with diabetic neuropathy, unspecified: Secondary | ICD-10-CM | POA: Diagnosis not present

## 2021-03-01 DIAGNOSIS — Z4822 Encounter for aftercare following kidney transplant: Secondary | ICD-10-CM | POA: Diagnosis not present

## 2021-03-01 DIAGNOSIS — I129 Hypertensive chronic kidney disease with stage 1 through stage 4 chronic kidney disease, or unspecified chronic kidney disease: Secondary | ICD-10-CM | POA: Diagnosis not present

## 2021-03-01 DIAGNOSIS — E1022 Type 1 diabetes mellitus with diabetic chronic kidney disease: Secondary | ICD-10-CM | POA: Diagnosis not present

## 2021-03-01 DIAGNOSIS — G479 Sleep disorder, unspecified: Secondary | ICD-10-CM | POA: Diagnosis not present

## 2021-03-01 DIAGNOSIS — Z940 Kidney transplant status: Secondary | ICD-10-CM | POA: Diagnosis not present

## 2021-03-01 DIAGNOSIS — Z79899 Other long term (current) drug therapy: Secondary | ICD-10-CM | POA: Diagnosis not present

## 2021-03-01 DIAGNOSIS — N183 Chronic kidney disease, stage 3 unspecified: Secondary | ICD-10-CM | POA: Diagnosis not present

## 2021-03-01 DIAGNOSIS — D84821 Immunodeficiency due to drugs: Secondary | ICD-10-CM | POA: Diagnosis not present

## 2021-03-01 DIAGNOSIS — D849 Immunodeficiency, unspecified: Secondary | ICD-10-CM | POA: Diagnosis not present

## 2021-03-01 DIAGNOSIS — I10 Essential (primary) hypertension: Secondary | ICD-10-CM | POA: Diagnosis not present

## 2021-03-01 DIAGNOSIS — Z9483 Pancreas transplant status: Secondary | ICD-10-CM | POA: Diagnosis not present

## 2021-03-02 ENCOUNTER — Other Ambulatory Visit (HOSPITAL_COMMUNITY): Payer: Self-pay

## 2021-03-08 ENCOUNTER — Other Ambulatory Visit (HOSPITAL_COMMUNITY): Payer: Self-pay

## 2021-03-08 MED FILL — Mycophenolate Sodium Tab DR 180 MG (Mycophenolic Acid Equiv): ORAL | 30 days supply | Qty: 150 | Fill #1 | Status: AC

## 2021-03-09 ENCOUNTER — Other Ambulatory Visit (HOSPITAL_COMMUNITY): Payer: Self-pay

## 2021-03-10 ENCOUNTER — Other Ambulatory Visit (HOSPITAL_COMMUNITY): Payer: Self-pay

## 2021-03-17 DIAGNOSIS — G629 Polyneuropathy, unspecified: Secondary | ICD-10-CM | POA: Diagnosis not present

## 2021-03-17 DIAGNOSIS — B348 Other viral infections of unspecified site: Secondary | ICD-10-CM | POA: Diagnosis not present

## 2021-03-17 DIAGNOSIS — I739 Peripheral vascular disease, unspecified: Secondary | ICD-10-CM | POA: Diagnosis not present

## 2021-03-17 DIAGNOSIS — I129 Hypertensive chronic kidney disease with stage 1 through stage 4 chronic kidney disease, or unspecified chronic kidney disease: Secondary | ICD-10-CM | POA: Diagnosis not present

## 2021-03-17 DIAGNOSIS — Z9483 Pancreas transplant status: Secondary | ICD-10-CM | POA: Diagnosis not present

## 2021-03-17 DIAGNOSIS — Z940 Kidney transplant status: Secondary | ICD-10-CM | POA: Diagnosis not present

## 2021-03-25 ENCOUNTER — Other Ambulatory Visit: Payer: Self-pay

## 2021-03-25 DIAGNOSIS — I779 Disorder of arteries and arterioles, unspecified: Secondary | ICD-10-CM

## 2021-03-30 ENCOUNTER — Other Ambulatory Visit (HOSPITAL_COMMUNITY): Payer: Self-pay

## 2021-03-30 MED FILL — Tacrolimus Cap 1 MG: ORAL | 30 days supply | Qty: 240 | Fill #1 | Status: AC

## 2021-03-31 ENCOUNTER — Other Ambulatory Visit (HOSPITAL_COMMUNITY): Payer: Self-pay

## 2021-04-01 DIAGNOSIS — D849 Immunodeficiency, unspecified: Secondary | ICD-10-CM | POA: Diagnosis not present

## 2021-04-01 DIAGNOSIS — Z940 Kidney transplant status: Secondary | ICD-10-CM | POA: Diagnosis not present

## 2021-04-01 DIAGNOSIS — Z9483 Pancreas transplant status: Secondary | ICD-10-CM | POA: Diagnosis not present

## 2021-04-07 ENCOUNTER — Other Ambulatory Visit (HOSPITAL_COMMUNITY): Payer: Self-pay

## 2021-04-12 ENCOUNTER — Other Ambulatory Visit (HOSPITAL_COMMUNITY): Payer: Self-pay

## 2021-04-12 MED ORDER — MYCOPHENOLATE SODIUM 180 MG PO TBEC
DELAYED_RELEASE_TABLET | ORAL | 2 refills | Status: DC
  Filled 2021-04-12: qty 150, 30d supply, fill #0
  Filled 2021-05-09: qty 150, 30d supply, fill #1

## 2021-04-14 ENCOUNTER — Other Ambulatory Visit (HOSPITAL_COMMUNITY): Payer: Self-pay

## 2021-04-15 ENCOUNTER — Other Ambulatory Visit (HOSPITAL_COMMUNITY): Payer: Self-pay

## 2021-04-15 MED FILL — Amlodipine Besylate Tab 10 MG (Base Equivalent): ORAL | 90 days supply | Qty: 90 | Fill #0 | Status: CN

## 2021-04-15 MED FILL — Sulfamethoxazole-Trimethoprim Tab 400-80 MG: ORAL | 84 days supply | Qty: 36 | Fill #0 | Status: CN

## 2021-04-15 MED FILL — Methocarbamol Tab 500 MG: ORAL | 30 days supply | Qty: 60 | Fill #1 | Status: AC

## 2021-04-19 ENCOUNTER — Other Ambulatory Visit (HOSPITAL_COMMUNITY): Payer: Self-pay

## 2021-04-22 ENCOUNTER — Other Ambulatory Visit (HOSPITAL_COMMUNITY): Payer: Self-pay

## 2021-04-23 ENCOUNTER — Other Ambulatory Visit (HOSPITAL_COMMUNITY): Payer: Self-pay

## 2021-04-27 ENCOUNTER — Other Ambulatory Visit (HOSPITAL_COMMUNITY): Payer: Self-pay

## 2021-04-29 MED FILL — Tacrolimus Cap 1 MG: ORAL | 30 days supply | Qty: 240 | Fill #2 | Status: AC

## 2021-04-30 ENCOUNTER — Other Ambulatory Visit (HOSPITAL_COMMUNITY): Payer: Self-pay

## 2021-04-30 MED FILL — Amlodipine Besylate Tab 10 MG (Base Equivalent): ORAL | 90 days supply | Qty: 90 | Fill #0 | Status: AC

## 2021-05-01 ENCOUNTER — Other Ambulatory Visit (HOSPITAL_COMMUNITY): Payer: Self-pay

## 2021-05-01 MED FILL — Sulfamethoxazole-Trimethoprim Tab 400-80 MG: ORAL | 84 days supply | Qty: 36 | Fill #0 | Status: AC

## 2021-05-04 DIAGNOSIS — Z940 Kidney transplant status: Secondary | ICD-10-CM | POA: Diagnosis not present

## 2021-05-04 DIAGNOSIS — Z9483 Pancreas transplant status: Secondary | ICD-10-CM | POA: Diagnosis not present

## 2021-05-04 DIAGNOSIS — D849 Immunodeficiency, unspecified: Secondary | ICD-10-CM | POA: Diagnosis not present

## 2021-05-09 MED FILL — Carvedilol Tab 12.5 MG: ORAL | 90 days supply | Qty: 180 | Fill #0 | Status: AC

## 2021-05-10 ENCOUNTER — Other Ambulatory Visit (HOSPITAL_COMMUNITY): Payer: Self-pay

## 2021-05-12 ENCOUNTER — Other Ambulatory Visit (HOSPITAL_COMMUNITY): Payer: Self-pay

## 2021-05-14 DIAGNOSIS — U071 COVID-19: Secondary | ICD-10-CM | POA: Diagnosis not present

## 2021-05-20 ENCOUNTER — Other Ambulatory Visit (HOSPITAL_COMMUNITY): Payer: Self-pay

## 2021-05-23 ENCOUNTER — Other Ambulatory Visit (HOSPITAL_COMMUNITY): Payer: Self-pay

## 2021-05-24 ENCOUNTER — Other Ambulatory Visit (HOSPITAL_COMMUNITY): Payer: Self-pay

## 2021-05-25 ENCOUNTER — Other Ambulatory Visit (HOSPITAL_COMMUNITY): Payer: Self-pay

## 2021-05-25 MED FILL — Methocarbamol Tab 500 MG: ORAL | 30 days supply | Qty: 60 | Fill #0 | Status: AC

## 2021-05-30 ENCOUNTER — Other Ambulatory Visit (HOSPITAL_COMMUNITY): Payer: Self-pay

## 2021-05-30 MED FILL — Tacrolimus Cap 1 MG: ORAL | 30 days supply | Qty: 240 | Fill #3 | Status: AC

## 2021-05-31 ENCOUNTER — Other Ambulatory Visit (HOSPITAL_COMMUNITY): Payer: Self-pay

## 2021-05-31 DIAGNOSIS — Z4822 Encounter for aftercare following kidney transplant: Secondary | ICD-10-CM | POA: Diagnosis not present

## 2021-05-31 DIAGNOSIS — N184 Chronic kidney disease, stage 4 (severe): Secondary | ICD-10-CM | POA: Diagnosis not present

## 2021-05-31 DIAGNOSIS — D849 Immunodeficiency, unspecified: Secondary | ICD-10-CM | POA: Diagnosis not present

## 2021-05-31 DIAGNOSIS — Z79899 Other long term (current) drug therapy: Secondary | ICD-10-CM | POA: Diagnosis not present

## 2021-05-31 DIAGNOSIS — I10 Essential (primary) hypertension: Secondary | ICD-10-CM | POA: Diagnosis not present

## 2021-05-31 DIAGNOSIS — I129 Hypertensive chronic kidney disease with stage 1 through stage 4 chronic kidney disease, or unspecified chronic kidney disease: Secondary | ICD-10-CM | POA: Diagnosis not present

## 2021-05-31 DIAGNOSIS — E1022 Type 1 diabetes mellitus with diabetic chronic kidney disease: Secondary | ICD-10-CM | POA: Diagnosis not present

## 2021-05-31 DIAGNOSIS — Z9483 Pancreas transplant status: Secondary | ICD-10-CM | POA: Diagnosis not present

## 2021-05-31 DIAGNOSIS — B348 Other viral infections of unspecified site: Secondary | ICD-10-CM | POA: Diagnosis not present

## 2021-05-31 DIAGNOSIS — E1040 Type 1 diabetes mellitus with diabetic neuropathy, unspecified: Secondary | ICD-10-CM | POA: Diagnosis not present

## 2021-05-31 DIAGNOSIS — Z940 Kidney transplant status: Secondary | ICD-10-CM | POA: Diagnosis not present

## 2021-06-02 ENCOUNTER — Other Ambulatory Visit (HOSPITAL_COMMUNITY): Payer: Self-pay

## 2021-06-02 MED ORDER — SULFAMETHOXAZOLE-TRIMETHOPRIM 400-80 MG PO TABS
ORAL_TABLET | ORAL | 3 refills | Status: DC
  Filled 2021-06-02: qty 36, 84d supply, fill #0

## 2021-06-04 DIAGNOSIS — Z79899 Other long term (current) drug therapy: Secondary | ICD-10-CM | POA: Diagnosis not present

## 2021-06-04 DIAGNOSIS — E1140 Type 2 diabetes mellitus with diabetic neuropathy, unspecified: Secondary | ICD-10-CM | POA: Diagnosis not present

## 2021-07-27 DIAGNOSIS — Z125 Encounter for screening for malignant neoplasm of prostate: Secondary | ICD-10-CM | POA: Diagnosis not present

## 2021-07-27 DIAGNOSIS — E1121 Type 2 diabetes mellitus with diabetic nephropathy: Secondary | ICD-10-CM | POA: Diagnosis not present

## 2021-07-27 DIAGNOSIS — E785 Hyperlipidemia, unspecified: Secondary | ICD-10-CM | POA: Diagnosis not present

## 2021-09-01 DIAGNOSIS — K5909 Other constipation: Secondary | ICD-10-CM | POA: Diagnosis not present

## 2021-09-01 DIAGNOSIS — K3184 Gastroparesis: Secondary | ICD-10-CM | POA: Diagnosis not present

## 2021-09-01 DIAGNOSIS — Z7952 Long term (current) use of systemic steroids: Secondary | ICD-10-CM | POA: Diagnosis not present

## 2021-09-01 DIAGNOSIS — I129 Hypertensive chronic kidney disease with stage 1 through stage 4 chronic kidney disease, or unspecified chronic kidney disease: Secondary | ICD-10-CM | POA: Diagnosis not present

## 2021-09-01 DIAGNOSIS — E1043 Type 1 diabetes mellitus with diabetic autonomic (poly)neuropathy: Secondary | ICD-10-CM | POA: Diagnosis not present

## 2021-09-01 DIAGNOSIS — I10 Essential (primary) hypertension: Secondary | ICD-10-CM | POA: Diagnosis not present

## 2021-09-01 DIAGNOSIS — Z794 Long term (current) use of insulin: Secondary | ICD-10-CM | POA: Diagnosis not present

## 2021-09-01 DIAGNOSIS — Z9483 Pancreas transplant status: Secondary | ICD-10-CM | POA: Diagnosis not present

## 2021-09-01 DIAGNOSIS — D849 Immunodeficiency, unspecified: Secondary | ICD-10-CM | POA: Diagnosis not present

## 2021-09-01 DIAGNOSIS — E1143 Type 2 diabetes mellitus with diabetic autonomic (poly)neuropathy: Secondary | ICD-10-CM | POA: Diagnosis not present

## 2021-09-01 DIAGNOSIS — K5904 Chronic idiopathic constipation: Secondary | ICD-10-CM | POA: Diagnosis not present

## 2021-09-01 DIAGNOSIS — Z79899 Other long term (current) drug therapy: Secondary | ICD-10-CM | POA: Diagnosis not present

## 2021-09-01 DIAGNOSIS — Z23 Encounter for immunization: Secondary | ICD-10-CM | POA: Diagnosis not present

## 2021-09-01 DIAGNOSIS — Z940 Kidney transplant status: Secondary | ICD-10-CM | POA: Diagnosis not present

## 2021-09-01 DIAGNOSIS — E1022 Type 1 diabetes mellitus with diabetic chronic kidney disease: Secondary | ICD-10-CM | POA: Diagnosis not present

## 2021-09-01 DIAGNOSIS — K30 Functional dyspepsia: Secondary | ICD-10-CM | POA: Diagnosis not present

## 2021-09-01 DIAGNOSIS — B348 Other viral infections of unspecified site: Secondary | ICD-10-CM | POA: Diagnosis not present

## 2021-09-01 DIAGNOSIS — Z7982 Long term (current) use of aspirin: Secondary | ICD-10-CM | POA: Diagnosis not present

## 2021-09-01 DIAGNOSIS — Z79621 Long term (current) use of calcineurin inhibitor: Secondary | ICD-10-CM | POA: Diagnosis not present

## 2021-09-01 DIAGNOSIS — Z48288 Encounter for aftercare following multiple organ transplant: Secondary | ICD-10-CM | POA: Diagnosis not present

## 2021-09-13 DIAGNOSIS — G609 Hereditary and idiopathic neuropathy, unspecified: Secondary | ICD-10-CM | POA: Diagnosis not present

## 2021-09-13 DIAGNOSIS — I10 Essential (primary) hypertension: Secondary | ICD-10-CM | POA: Diagnosis not present

## 2021-09-13 DIAGNOSIS — Z9483 Pancreas transplant status: Secondary | ICD-10-CM | POA: Diagnosis not present

## 2021-09-13 DIAGNOSIS — F419 Anxiety disorder, unspecified: Secondary | ICD-10-CM | POA: Diagnosis not present

## 2021-09-13 DIAGNOSIS — Z0000 Encounter for general adult medical examination without abnormal findings: Secondary | ICD-10-CM | POA: Diagnosis not present

## 2021-09-13 DIAGNOSIS — K219 Gastro-esophageal reflux disease without esophagitis: Secondary | ICD-10-CM | POA: Diagnosis not present

## 2021-09-13 DIAGNOSIS — I2510 Atherosclerotic heart disease of native coronary artery without angina pectoris: Secondary | ICD-10-CM | POA: Diagnosis not present

## 2021-09-13 DIAGNOSIS — D84821 Immunodeficiency due to drugs: Secondary | ICD-10-CM | POA: Diagnosis not present

## 2021-09-13 DIAGNOSIS — E785 Hyperlipidemia, unspecified: Secondary | ICD-10-CM | POA: Diagnosis not present

## 2021-09-13 DIAGNOSIS — E1169 Type 2 diabetes mellitus with other specified complication: Secondary | ICD-10-CM | POA: Diagnosis not present

## 2021-10-04 ENCOUNTER — Other Ambulatory Visit (HOSPITAL_COMMUNITY): Payer: Self-pay

## 2021-10-04 DIAGNOSIS — Z940 Kidney transplant status: Secondary | ICD-10-CM | POA: Diagnosis not present

## 2021-10-04 DIAGNOSIS — Z9483 Pancreas transplant status: Secondary | ICD-10-CM | POA: Diagnosis not present

## 2021-10-04 DIAGNOSIS — L720 Epidermal cyst: Secondary | ICD-10-CM | POA: Diagnosis not present

## 2021-10-04 DIAGNOSIS — Z79899 Other long term (current) drug therapy: Secondary | ICD-10-CM | POA: Diagnosis not present

## 2021-10-04 DIAGNOSIS — B078 Other viral warts: Secondary | ICD-10-CM | POA: Diagnosis not present

## 2021-10-04 DIAGNOSIS — B079 Viral wart, unspecified: Secondary | ICD-10-CM | POA: Diagnosis not present

## 2021-10-06 ENCOUNTER — Other Ambulatory Visit: Payer: Self-pay

## 2021-10-06 ENCOUNTER — Ambulatory Visit (INDEPENDENT_AMBULATORY_CARE_PROVIDER_SITE_OTHER): Payer: Medicare Other

## 2021-10-06 ENCOUNTER — Ambulatory Visit (INDEPENDENT_AMBULATORY_CARE_PROVIDER_SITE_OTHER): Payer: Medicare Other | Admitting: Podiatry

## 2021-10-06 ENCOUNTER — Encounter: Payer: Self-pay | Admitting: Podiatry

## 2021-10-06 DIAGNOSIS — I779 Disorder of arteries and arterioles, unspecified: Secondary | ICD-10-CM

## 2021-10-06 DIAGNOSIS — M79671 Pain in right foot: Secondary | ICD-10-CM

## 2021-10-06 DIAGNOSIS — L600 Ingrowing nail: Secondary | ICD-10-CM

## 2021-10-06 DIAGNOSIS — M722 Plantar fascial fibromatosis: Secondary | ICD-10-CM | POA: Diagnosis not present

## 2021-10-06 DIAGNOSIS — M79672 Pain in left foot: Secondary | ICD-10-CM

## 2021-10-06 MED ORDER — TRIAMCINOLONE ACETONIDE 10 MG/ML IJ SUSP
10.0000 mg | Freq: Once | INTRAMUSCULAR | Status: AC
Start: 2021-10-06 — End: 2021-10-06
  Administered 2021-10-06: 10 mg

## 2021-10-06 NOTE — Progress Notes (Signed)
Subjective:   Patient ID: Len Blalock, male   DOB: 50 y.o.   MRN: [id number]   HPI Patient states he is having a lot of pain in his left heel and on his right big toe it is very sore in the corner and hard for him to wear shoe gear and to have sheets pressing on his foot.  Patient had a pancreatic transplant along with kidney transplant a number of years ago that he is done well with but does develop pain and other issues.  Patient does not smoke and likes to be active if possible   Review of Systems  All other systems reviewed and are negative.      Objective:  Physical Exam Vitals and nursing note reviewed.  Constitutional:      Appearance: He is well-developed.  Pulmonary:     Effort: Pulmonary effort is normal.  Musculoskeletal:        General: Normal range of motion.  Skin:    General: Skin is warm.  Neurological:     Mental Status: He is alert.    Neurovascular status intact muscle strength found to be adequate range of motion adequate.  Patient is noted to have exquisite discomfort plantar aspect left heel at the insertional point tendon calcaneus and also has lesions on the heel secondary to dry skin with lucent course and has an incurvated medial border right hallux no redness no erythema edema noted     Assessment:  Poor health individual who has had transplant who has probability for inflamed plantar heel with lesion formation left and ingrown toenail right with incurvation     Plan:  H&P reviewed both conditions and for the right I did anesthetized 60 mg like Marcaine mixture I remove the medial border and remove some thickened tissue from the area and applied sterile dressing.  Left I went ahead did sterile prep and injected the fascia 3 mg Kenalog 5 mg Xylocaine and then did sharp debridement of lesions and patient will be seen back as needed  X-rays were negative for signs of fracture did indicate arterial calcification and did indicate plantar spur formation.   Did discuss that he needs to continue to follow-up on vascular issues which he does do on a regular basis

## 2021-11-11 DIAGNOSIS — L720 Epidermal cyst: Secondary | ICD-10-CM | POA: Diagnosis not present

## 2021-11-18 ENCOUNTER — Encounter: Payer: Self-pay | Admitting: Gastroenterology

## 2021-11-24 DIAGNOSIS — Z9483 Pancreas transplant status: Secondary | ICD-10-CM | POA: Diagnosis not present

## 2021-11-24 DIAGNOSIS — D849 Immunodeficiency, unspecified: Secondary | ICD-10-CM | POA: Diagnosis not present

## 2021-11-24 DIAGNOSIS — Z940 Kidney transplant status: Secondary | ICD-10-CM | POA: Diagnosis not present

## 2021-11-25 ENCOUNTER — Ambulatory Visit (AMBULATORY_SURGERY_CENTER): Payer: Medicare Other | Admitting: *Deleted

## 2021-11-25 ENCOUNTER — Other Ambulatory Visit: Payer: Self-pay

## 2021-11-25 VITALS — Ht 70.0 in | Wt 201.0 lb

## 2021-11-25 DIAGNOSIS — Z1211 Encounter for screening for malignant neoplasm of colon: Secondary | ICD-10-CM

## 2021-11-25 MED ORDER — PEG 3350-KCL-NA BICARB-NACL 420 G PO SOLR: 4000.0000 mL | Freq: Once | ORAL | 0 refills | Status: AC

## 2021-11-25 NOTE — Progress Notes (Signed)
Patient's pre-visit was done today over the phone with the patient. Name,DOB and address verified. Patient denies any allergies to Eggs and Soy. Patient denies any problems with anesthesia/sedation. Patient is not taking any diet pills or blood thinners. No home Oxygen. Patient denies being on a blood thinner. Prep instructions sent to pt's MyChart-pt aware. Patient understands to call us back with any questions or concerns. Patient is aware of our care-partner policy and [id number] safety protocol.   EMMI education assigned to the patient for the procedure, sent to [place].   The patient is COVID-19 vaccinated.

## 2021-12-07 ENCOUNTER — Other Ambulatory Visit: Payer: Self-pay | Admitting: Podiatry

## 2021-12-07 DIAGNOSIS — M722 Plantar fascial fibromatosis: Secondary | ICD-10-CM

## 2021-12-09 ENCOUNTER — Ambulatory Visit (AMBULATORY_SURGERY_CENTER): Payer: Medicare Other | Admitting: Gastroenterology

## 2021-12-09 ENCOUNTER — Encounter: Payer: Self-pay | Admitting: Gastroenterology

## 2021-12-09 ENCOUNTER — Other Ambulatory Visit: Payer: Self-pay

## 2021-12-09 VITALS — BP 122/77 | HR 67 | Temp 97.8°F | Resp 18 | Ht 70.0 in | Wt 201.0 lb

## 2021-12-09 DIAGNOSIS — Z1211 Encounter for screening for malignant neoplasm of colon: Secondary | ICD-10-CM

## 2021-12-09 MED ORDER — SODIUM CHLORIDE 0.9 % IV SOLN: 500.0000 mL | Freq: Once | INTRAVENOUS | Status: DC

## 2021-12-09 NOTE — Patient Instructions (Signed)
Resume previous diet and medications. Repeat Colonoscopy in 10 years for screening purposes.  YOU HAD AN ENDOSCOPIC PROCEDURE TODAY AT [facility]:   Refer to the procedure report that was given to you for any specific questions about what was found during the examination.  If the procedure report does not answer your questions, please call your gastroenterologist to clarify.  If you requested that your care partner not be given the details of your procedure findings, then the procedure report has been included in a sealed envelope for you to review at your convenience later.  YOU SHOULD EXPECT: Some feelings of bloating in the abdomen. Passage of more gas than usual.  Walking can help get rid of the air that was put into your GI tract during the procedure and reduce the bloating. If you had a lower endoscopy (such as a colonoscopy or flexible sigmoidoscopy) you may notice spotting of blood in your stool or on the toilet paper. If you underwent a bowel prep for your procedure, you may not have a normal bowel movement for a few days.  Please Note:  You might notice some irritation and congestion in your nose or some drainage.  This is from the oxygen used during your procedure.  There is no need for concern and it should clear up in a day or so.  SYMPTOMS TO REPORT IMMEDIATELY:  Following lower endoscopy (colonoscopy or flexible sigmoidoscopy):  Excessive amounts of blood in the stool  Significant tenderness or worsening of abdominal pains  Swelling of the abdomen that is new, acute  Fever of 100F or higher  For urgent or emergent issues, a gastroenterologist can be reached at any hour by calling [phone]. Do not use MyChart messaging for urgent concerns.    DIET:  We do recommend a small meal at first, but then you may proceed to your regular diet.  Drink plenty of fluids but you should avoid alcoholic beverages for 24 hours.  ACTIVITY:  You should plan to take it easy  for the rest of today and you should NOT DRIVE or use heavy machinery until tomorrow (because of the sedation medicines used during the test).    FOLLOW UP: Our staff will call the number listed on your records 48-72 hours following your procedure to check on you and address any questions or concerns that you may have regarding the information given to you following your procedure. If we do not reach you, we will leave a message.  We will attempt to reach you two times.  During this call, we will ask if you have developed any symptoms of COVID 19. If you develop any symptoms (ie: fever, flu-like symptoms, shortness of breath, cough etc.) before then, please call [phone].  If you test positive for Covid 19 in the 2 weeks post procedure, please call and report this information to [place].    If any biopsies were taken you will be contacted by phone or by letter within the next 1-3 weeks.  Please call us at [phone] if you have not heard about the biopsies in 3 weeks.    SIGNATURES/CONFIDENTIALITY: You and/or your care partner have signed paperwork which will be entered into your electronic medical record.  These signatures attest to the fact that that the information above on your After Visit Summary has been reviewed and is understood.  Full responsibility of the confidentiality of this discharge information lies with you and/or your care-partner.

## 2021-12-09 NOTE — Progress Notes (Signed)
Pt's states no medical or surgical changes since previsit or office visit.  ° °VS DT °

## 2021-12-09 NOTE — Op Note (Signed)
[facility] Patient Name: Gregory Allen Procedure Date: 12/09/2021 9:15 AM MRN: [id number] Endoscopist: Nicki Reaper E. Candis Schatz , MD Age: 51 Referring MD:  Date of Birth: 1971-04-29 Gender: Male Account #: [id number] Procedure:                Colonoscopy Indications:              Screening for colorectal malignant neoplasm Medicines:                Monitored Anesthesia Care Procedure:                Pre-Anesthesia Assessment:                           - Prior to the procedure, a History and Physical                            was performed, and patient medications and                            allergies were reviewed. The patient's tolerance of                            previous anesthesia was also reviewed. The risks                            and benefits of the procedure and the sedation                            options and risks were discussed with the patient.                            All questions were answered, and informed consent                            was obtained. Prior Anticoagulants: The patient has                            taken no previous anticoagulant or antiplatelet                            agents. ASA Grade Assessment: III - A patient with                            severe systemic disease. After reviewing the risks                            and benefits, the patient was deemed in                            satisfactory condition to undergo the procedure.                           After obtaining informed consent, the colonoscope  was passed under direct vision. Throughout the                            procedure, the patient's blood pressure, pulse, and                            oxygen saturations were monitored continuously. The                            Olympus CF-HQ190L ([id number]) Colonoscope was                            introduced through the anus and advanced to the the                            terminal  ileum, with identification of the                            appendiceal orifice and IC valve. The colonoscopy                            was performed without difficulty. The patient                            tolerated the procedure well. The quality of the                            bowel preparation was good. The terminal ileum,                            ileocecal valve, appendiceal orifice, and rectum                            were photographed. The bowel preparation used was                            GoLYTELY via split dose instruction. Scope In: 9:26:25 AM Scope Out: 9:37:20 AM Scope Withdrawal Time: 0 hours 8 minutes 24 seconds  Total Procedure Duration: 0 hours 10 minutes 55 seconds  Findings:                 The perianal and digital rectal examinations were                            normal. Pertinent negatives include normal                            sphincter tone and no palpable rectal lesions.                           The colon (entire examined portion) appeared normal.                           The terminal ileum appeared normal.  Non-bleeding internal hemorrhoids were found during                            retroflexion. The hemorrhoids were Grade I                            (internal hemorrhoids that do not prolapse).                           No additional abnormalities were found on                            retroflexion. Complications:            No immediate complications. Estimated Blood Loss:     Estimated blood loss: none. Impression:               - The entire examined colon is normal.                           - The examined portion of the ileum was normal.                           - Non-bleeding internal hemorrhoids.                           - No specimens collected. Recommendation:           - Patient has a contact number available for                            emergencies. The signs and symptoms of potential                             delayed complications were discussed with the                            patient. Return to normal activities tomorrow.                            Written discharge instructions were provided to the                            patient.                           - Resume previous diet.                           - Continue present medications.                           - Repeat colonoscopy in 10 years for screening                            purposes. Helia Haese E. Candis Schatz, MD 12/09/2021 9:42:35 AM This report has been signed electronically.

## 2021-12-09 NOTE — Progress Notes (Signed)
Pt non-responsive, VVS, Report to RN  °

## 2021-12-09 NOTE — Progress Notes (Signed)
[facility] History and Physical   Primary Care Physician:  Velna Hatchet, MD   Reason for Procedure:   Colon cancer screening  Plan:    Screening colonoscopy     HPI: Gregory Allen is a 51 y.o. male undergoing initial average risk screening colonoscopy.  He has no family history of colon cancer and no chronic GI symptoms. 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   Peripheral neuropathy     Past Surgical History:  Procedure Laterality Date   ABDOMINAL AORTOGRAM W/LOWER EXTREMITY Bilateral 06/12/2020   Procedure: ABDOMINAL AORTOGRAM W/LOWER EXTREMITY;  Surgeon: Elam Dutch, MD;  Location: [facility];  Service: Cardiovascular;  Laterality: Bilateral;  Bilateral    ANKLE SURGERY Right    BACK SURGERY     FOOT SURGERY Left    HAND SURGERY Right    thumb   KIDNEY TRANSPLANT     TRANSPLANT PANCREATIC ALLOGRAFT     ULNAR TUNNEL RELEASE Right 10/28/2020   Procedure: RIGHT CUBITAL TUNNEL RELEASE, TRANSPOSITION;  Surgeon: Leandrew Koyanagi, MD;  Location: [facility];  Service: Orthopedics;  Laterality: Right;    Prior to Admission medications   Medication Sig Start Date End Date Taking? Authorizing Provider  acetaminophen (TYLENOL) 500 MG tablet Take 1,000 mg by mouth every 6 (six) hours as needed for moderate pain or headache.   Yes [provider]  amLODipine (NORVASC) 10 MG tablet Take 10 mg by mouth daily.  05/14/18  Yes [provider]  aspirin EC 81 MG tablet Take 81 mg by mouth daily.  04/14/17  Yes [provider]  carvedilol (COREG) 12.5 MG tablet Take 12.5 mg by mouth 2 (two) times daily with a meal.   Yes [provider]  DULoxetine (CYMBALTA) 30 MG capsule TAKE 2 CAPSULES BY MOUTH AFTER BREAKFAST AND 1 CAPSULE EVERY EVENING 09/21/20 11/08/23 Yes Bintrim, [place], MD  gabapentin (NEURONTIN) 300 MG capsule Take 600 mg by mouth 2 (two) times daily.   Yes [provider]  Magnesium 400 MG TABS Take 400 mg by mouth daily.   Yes [provider]  methocarbamol (ROBAXIN) 500 MG tablet TAKE 1 TABLET BY MOUTH 2 TIMES DAILY AS NEEDED FOR MUSCLE SPASMS 01/26/21 01/26/22 Yes Velna Hatchet, MD  mycophenolate (MYFORTIC) 180 MG EC tablet Take 3 tablets (540 mg total) by mouth in the morning AND 2 tablets (360 mg total) every evening. 04/12/21  Yes   pentoxifylline (TRENTAL) 400 MG CR tablet Take 400 mg by mouth 2 (two) times daily.   Yes [provider]  predniSONE (DELTASONE) 5 MG tablet Take 1 tablet by mouth once a day 02/26/21  Yes   sulfamethoxazole-trimethoprim (BACTRIM) 400-80 MG tablet TAKE 1 TABLET BY MOUTH EVERY MONDAY, [place], FRIDAY. 06/02/21  Yes   tacrolimus (PROGRAF) 1 MG capsule TAKE 3 CAPSULES BY MOUTH IN THE MORNING AND 3 CAPSULES IN THE EVENING 01/05/21 01/05/22 Yes Sakhovskaya, Marcy Panning, MD  Capsaicin 0.1 % CREA Apply 1 application topically daily as needed (pain).    [provider]  rosuvastatin (CRESTOR) 5 MG tablet Take 1 tablet by mouth every Monday, Wednesday, and Friday. Patient not taking:  Reported on 12/09/2021 02/26/21       Current Outpatient Medications  Medication Sig Dispense Refill   acetaminophen (TYLENOL) 500 MG tablet Take 1,000 mg by mouth every 6 (six) hours as needed for moderate pain or headache.     amLODipine (NORVASC) 10 MG tablet Take 10 mg by mouth daily.      aspirin EC 81 MG tablet Take 81 mg by mouth daily.      carvedilol (COREG) 12.5 MG tablet Take 12.5 mg by mouth 2 (two) times daily with a meal.     DULoxetine (CYMBALTA) 30 MG capsule TAKE 2 CAPSULES BY MOUTH AFTER BREAKFAST AND 1 CAPSULE EVERY EVENING 90 capsule 5    gabapentin (NEURONTIN) 300 MG capsule Take 600 mg by mouth 2 (two) times daily.     Magnesium 400 MG TABS Take 400 mg by mouth daily.     methocarbamol (ROBAXIN) 500 MG tablet TAKE 1 TABLET BY MOUTH 2 TIMES DAILY AS NEEDED FOR MUSCLE SPASMS 60 tablet 2   mycophenolate (MYFORTIC) 180 MG EC tablet Take 3 tablets (540 mg total) by mouth in the morning AND 2 tablets (360 mg total) every evening. 150 tablet 2   pentoxifylline (TRENTAL) 400 MG CR tablet Take 400 mg by mouth 2 (two) times daily.     predniSONE (DELTASONE) 5 MG tablet Take 1 tablet by mouth once a day 90 tablet 3   sulfamethoxazole-trimethoprim (BACTRIM) 400-80 MG tablet TAKE 1 TABLET BY MOUTH EVERY MONDAY, WEDNESDAY, FRIDAY. 36 tablet 3   tacrolimus (PROGRAF) 1 MG capsule TAKE 3 CAPSULES BY MOUTH IN THE MORNING AND 3 CAPSULES IN THE EVENING 180 capsule 5   Capsaicin 0.1 % CREA Apply 1 application topically daily as needed (pain).     rosuvastatin (CRESTOR) 5 MG tablet Take 1 tablet by mouth every Monday, Wednesday, and Friday. (Patient not taking: Reported on 12/09/2021) 36 tablet 3   Current Facility-Administered Medications  Medication Dose Route Frequency Provider Last Rate Last Admin   0.9 %  sodium chloride infusion  500 mL Intravenous Once Daryel November, MD        Allergies as of 12/09/2021 - Review Complete 12/09/2021  Allergen Reaction Noted   Shellfish-derived products Itching 11/19/2015   Ivp dye [iodinated contrast media]  10/21/2020    Family History  Problem Relation Age of Onset   Diabetes Mother    Hypertension Mother    Diabetes Father    Hypertension Father    Colon cancer Neg Hx    Colon polyps Neg Hx     Social History   Socioeconomic History   Marital status: Married    Spouse name: Not on file   Number of children: 2   Years of education: 14   Highest education level: Not on file  Occupational History   Occupation: Disabled   Tobacco Use   Smoking status: Never   Smokeless tobacco:  Never  Vaping Use   Vaping Use: Never used  Substance and Sexual Activity   Alcohol use: No    Alcohol/week: 0.0 standard drinks   Drug use: No   Sexual activity: Yes  Other Topics Concern   Not on file  Social History Narrative   Right handed    Coffee daily   Social Determinants of Health   Financial Resource Strain: Not on file  Food Insecurity: Not on file  Transportation Needs: Not on file  Physical Activity: Not on file  Stress: Not on file  Social Connections: Not  on file  Intimate Partner Violence: Not on file    Review of Systems:  All other review of systems negative except as mentioned in the HPI.  Physical Exam: Vital signs BP (!) 114/56    Pulse 70    Temp 97.8 F (36.6 C) (Temporal)    Ht 5\' 10"  (1.778 m)    Wt 201 lb (91.2 kg)    SpO2 100%    BMI 28.84 kg/m   General:   Alert,  Well-developed, well-nourished, pleasant and cooperative in NAD Airway:  Mallampati 2 Lungs:  Clear throughout to auscultation.   Heart:  Regular rate and rhythm; no murmurs, clicks, rubs,  or gallops. Abdomen:  Soft, nontender and nondistended. Normal bowel sounds.   Neuro/Psych:  Normal mood and affect. A and O x 3   Gregory Allen E. Candis Schatz, MD [facility]

## 2021-12-13 ENCOUNTER — Telehealth: Payer: Self-pay | Admitting: *Deleted

## 2021-12-13 NOTE — Telephone Encounter (Signed)
°  Follow up Call-  Call back number 12/09/2021  Post procedure Call Back phone  # [phone]  Permission to leave phone message Yes  Some recent data might be hidden     Patient questions:  Do you have a fever, pain , or abdominal swelling? No. Pain Score  0 *  Have you tolerated food without any problems? Yes.    Have you been able to return to your normal activities? Yes.    Do you have any questions about your discharge instructions: Diet   No. Medications  No. Follow up visit  No.  Do you have questions or concerns about your Care? No.  Actions: * If pain score is 4 or above: No action needed, pain <4. Have you developed a fever since your procedure? no  2.   Have you had an respiratory symptoms (SOB or cough) since your procedure? no no 3.   Have you tested positive for COVID 19 since your procedure   4.   Have you had any family no members/close contacts diagnosed with the COVID 19 since your procedure?     If yes to any of these questions please route to Joylene John, RN and Joella Prince, RN

## 2021-12-14 DIAGNOSIS — Z940 Kidney transplant status: Secondary | ICD-10-CM | POA: Diagnosis not present

## 2021-12-14 DIAGNOSIS — Z4822 Encounter for aftercare following kidney transplant: Secondary | ICD-10-CM | POA: Diagnosis not present

## 2021-12-22 ENCOUNTER — Other Ambulatory Visit: Payer: Self-pay

## 2021-12-22 ENCOUNTER — Ambulatory Visit (INDEPENDENT_AMBULATORY_CARE_PROVIDER_SITE_OTHER): Payer: Medicare Other | Admitting: Podiatry

## 2021-12-22 ENCOUNTER — Encounter: Payer: Self-pay | Admitting: Podiatry

## 2021-12-22 DIAGNOSIS — L600 Ingrowing nail: Secondary | ICD-10-CM

## 2021-12-22 NOTE — Progress Notes (Signed)
Subjective:   Patient ID: Gregory Allen, male   DOB: 51 y.o.   MRN: [id number]   HPI Patient presents stating the ingrown toenail of his right foot has really been bothering him and that what we did last time only helped him temporarily and he needs to have it fixed permanently    ROS      Objective:  Physical Exam  Neurovascular status intact with the patient's medial border right hallux found to be incurvated with history of temporary procedure only giving him short-term relief.  He does have good digital perfusion well oriented and is not a diabetic after having had transplant     Assessment:  Ingrown toenail deformity right hallux medial border painful     Plan:  Reviewed condition discussed treatment options and he would like a permanent correction I allowed him to read consent form for correction explaining procedure and risk.  Today I infiltrated the right hallux 60 mg like Marcaine mixture sterile prep done and using sterile instrumentation remove the medial border exposed matrix applied phenol 3 applications 30 seconds followed by alcohol lavage sterile dressing and gave instructions on soaks and to leave dressing on 24 hours but take it off earlier if any throbbing were to occur

## 2021-12-22 NOTE — Patient Instructions (Addendum)
Place 1/4 cup of epsom salts in a quart of warm tap water.  Submerge your foot or feet in the solution and soak for 20 minutes.  This soak should be done twice a day.  Next, remove your foot or feet from solution, blot dry the affected area. Apply ointment and cover if instructed by your doctor.   IF YOUR SKIN BECOMES IRRITATED WHILE USING THESE INSTRUCTIONS, IT IS OKAY TO SWITCH TO  WHITE VINEGAR AND WATER.  As another alternative soak, you may use antibacterial soap and water.  Monitor for any signs/symptoms of infection. Call the office immediately if any occur or go directly to the emergency room. Call with any questions/concerns.   Ingrown Toenail An ingrown toenail occurs when the corner or sides of a toenail grow into the surrounding skin. This causes discomfort and pain. The big toe is most commonly affected, but any of the toes can be affected. If an ingrown toenail is not treated, it can become infected. What are the causes? This condition may be caused by: Wearing shoes that are too small or tight. An injury, such as stubbing your toe or having your toe stepped on. Improper cutting or care of your toenails. Having nail or foot abnormalities that were present from birth (congenital abnormalities), such as having a nail that is too big for your toe. What increases the risk? The following factors may make you more likely to develop ingrown toenails: Age. Nails tend to get thicker with age, so ingrown nails are more common among older people. Cutting your toenails incorrectly, such as cutting them very short or cutting them unevenly. An ingrown toenail is more likely to get infected if you have: Diabetes. Blood flow (circulation) problems. What are the signs or symptoms? Symptoms of an ingrown toenail may include: Pain, soreness, or tenderness. Redness. Swelling. Hardening of the skin that surrounds the toenail. Signs that an ingrown toenail may be infected include: Fluid or  pus. Symptoms that get worse. How is this diagnosed? Ingrown toenails may be diagnosed based on: Your symptoms and medical history. A physical exam. Labs or tests. If you have fluid or blood coming from your toenail, a sample may be collected to test for the specific type of bacteria that is causing the infection. How is this treated? Treatment depends on the severity of your symptoms. You may be able to care for your toenail at home. If you have an infection, you may be prescribed antibiotic medicines. If you have fluid or pus draining from your toenail, your health care provider may drain it. If you have trouble walking, you may be given crutches to use. If you have a severe or infected ingrown toenail, you may need a procedure to remove part or all of the nail. Follow these instructions at home: [place] your wound every day for signs of infection, or as often as told by your health care provider. Check for: More redness, swelling, or pain. More fluid or blood. Warmth. Pus or a bad smell. Do not pick at your toenail or try to remove it yourself. Soak your foot in warm, soapy water. Do this for 20 minutes, 3 times a day, or as often as told by your health care provider. This helps to keep your toe clean and your skin soft. Wear shoes that fit well and are not too tight. Your health care provider may recommend that you wear open-toed shoes while you heal. Trim your toenails regularly and carefully. Cut your  toenails straight across to prevent injury to the skin at the corners of the toenail. Do not cut your nails in a curved shape. Keep your feet clean and dry to help prevent infection. General instructions Take over-the-counter and prescription medicines only as told by your health care provider. If you were prescribed an antibiotic, take it as told by your health care provider. Do not stop taking the antibiotic even if you start to feel better. If your health care provider  told you to use crutches to help you move around, use them as instructed. Return to your normal activities as told by your health care provider. Ask your health care provider what activities are safe for you. Keep all follow-up visits. This is important. Contact a health care provider if: You have more redness, swelling, pain, or other symptoms that do not improve with treatment. You have fluid, blood, or pus coming from your toenail. You have a red streak on your skin that starts at your foot and spreads up your leg. You have a fever. Summary An ingrown toenail occurs when the corner or sides of a toenail grow into the surrounding skin. This causes discomfort and pain. The big toe is most commonly affected, but any of the toes can be affected. If an ingrown toenail is not treated, it can become infected. Fluid or pus draining from your toenail is a sign of infection. Your health care provider may need to drain it. You may be given antibiotics to treat the infection. Trimming your toenails regularly and properly can help you prevent an ingrown toenail. This information is not intended to replace advice given to you by your health care provider. Make sure you discuss any questions you have with your health care provider. Document Revised: 02/23/2021 Document Reviewed: 02/23/2021 Elsevier Patient Education  [place].

## 2022-01-10 DIAGNOSIS — Z9483 Pancreas transplant status: Secondary | ICD-10-CM | POA: Diagnosis not present

## 2022-01-10 DIAGNOSIS — Z940 Kidney transplant status: Secondary | ICD-10-CM | POA: Diagnosis not present

## 2022-01-10 DIAGNOSIS — D849 Immunodeficiency, unspecified: Secondary | ICD-10-CM | POA: Diagnosis not present

## 2022-01-20 DIAGNOSIS — Z4822 Encounter for aftercare following kidney transplant: Secondary | ICD-10-CM | POA: Diagnosis not present

## 2022-03-02 DIAGNOSIS — Z940 Kidney transplant status: Secondary | ICD-10-CM | POA: Diagnosis not present

## 2022-03-02 DIAGNOSIS — Z9483 Pancreas transplant status: Secondary | ICD-10-CM | POA: Diagnosis not present

## 2022-03-02 DIAGNOSIS — R61 Generalized hyperhidrosis: Secondary | ICD-10-CM | POA: Diagnosis not present

## 2022-03-02 DIAGNOSIS — R7303 Prediabetes: Secondary | ICD-10-CM | POA: Diagnosis not present

## 2022-03-02 DIAGNOSIS — B348 Other viral infections of unspecified site: Secondary | ICD-10-CM | POA: Diagnosis not present

## 2022-03-02 DIAGNOSIS — I739 Peripheral vascular disease, unspecified: Secondary | ICD-10-CM | POA: Diagnosis not present

## 2022-03-02 DIAGNOSIS — K3184 Gastroparesis: Secondary | ICD-10-CM | POA: Diagnosis not present

## 2022-03-02 DIAGNOSIS — Z79899 Other long term (current) drug therapy: Secondary | ICD-10-CM | POA: Diagnosis not present

## 2022-03-02 DIAGNOSIS — I10 Essential (primary) hypertension: Secondary | ICD-10-CM | POA: Diagnosis not present

## 2022-03-02 DIAGNOSIS — D849 Immunodeficiency, unspecified: Secondary | ICD-10-CM | POA: Diagnosis not present

## 2022-03-02 DIAGNOSIS — Z7952 Long term (current) use of systemic steroids: Secondary | ICD-10-CM | POA: Diagnosis not present

## 2022-03-02 DIAGNOSIS — Z4822 Encounter for aftercare following kidney transplant: Secondary | ICD-10-CM | POA: Diagnosis not present

## 2022-03-02 DIAGNOSIS — B349 Viral infection, unspecified: Secondary | ICD-10-CM | POA: Diagnosis not present

## 2022-03-02 DIAGNOSIS — G629 Polyneuropathy, unspecified: Secondary | ICD-10-CM | POA: Diagnosis not present

## 2022-03-09 DIAGNOSIS — Z940 Kidney transplant status: Secondary | ICD-10-CM | POA: Diagnosis not present

## 2022-03-09 DIAGNOSIS — Z9483 Pancreas transplant status: Secondary | ICD-10-CM | POA: Diagnosis not present

## 2022-03-09 DIAGNOSIS — G629 Polyneuropathy, unspecified: Secondary | ICD-10-CM | POA: Diagnosis not present

## 2022-03-09 DIAGNOSIS — I129 Hypertensive chronic kidney disease with stage 1 through stage 4 chronic kidney disease, or unspecified chronic kidney disease: Secondary | ICD-10-CM | POA: Diagnosis not present

## 2022-03-09 DIAGNOSIS — I739 Peripheral vascular disease, unspecified: Secondary | ICD-10-CM | POA: Diagnosis not present

## 2022-03-09 DIAGNOSIS — B348 Other viral infections of unspecified site: Secondary | ICD-10-CM | POA: Diagnosis not present

## 2022-03-31 DIAGNOSIS — Z9483 Pancreas transplant status: Secondary | ICD-10-CM | POA: Diagnosis not present

## 2022-03-31 DIAGNOSIS — Z48288 Encounter for aftercare following multiple organ transplant: Secondary | ICD-10-CM | POA: Diagnosis not present

## 2022-03-31 DIAGNOSIS — Z940 Kidney transplant status: Secondary | ICD-10-CM | POA: Diagnosis not present

## 2022-04-28 DIAGNOSIS — Z940 Kidney transplant status: Secondary | ICD-10-CM | POA: Diagnosis not present

## 2022-04-28 DIAGNOSIS — D849 Immunodeficiency, unspecified: Secondary | ICD-10-CM | POA: Diagnosis not present

## 2022-04-28 DIAGNOSIS — Z9483 Pancreas transplant status: Secondary | ICD-10-CM | POA: Diagnosis not present

## 2022-05-09 DIAGNOSIS — Z940 Kidney transplant status: Secondary | ICD-10-CM | POA: Diagnosis not present

## 2022-05-09 DIAGNOSIS — D849 Immunodeficiency, unspecified: Secondary | ICD-10-CM | POA: Diagnosis not present

## 2022-05-09 DIAGNOSIS — Z9483 Pancreas transplant status: Secondary | ICD-10-CM | POA: Diagnosis not present

## 2022-05-31 DIAGNOSIS — D849 Immunodeficiency, unspecified: Secondary | ICD-10-CM | POA: Diagnosis not present

## 2022-05-31 DIAGNOSIS — Z940 Kidney transplant status: Secondary | ICD-10-CM | POA: Diagnosis not present

## 2022-05-31 DIAGNOSIS — Z9483 Pancreas transplant status: Secondary | ICD-10-CM | POA: Diagnosis not present

## 2022-06-07 DIAGNOSIS — Z9483 Pancreas transplant status: Secondary | ICD-10-CM | POA: Diagnosis not present

## 2022-06-07 DIAGNOSIS — I739 Peripheral vascular disease, unspecified: Secondary | ICD-10-CM | POA: Diagnosis not present

## 2022-06-07 DIAGNOSIS — B348 Other viral infections of unspecified site: Secondary | ICD-10-CM | POA: Diagnosis not present

## 2022-06-07 DIAGNOSIS — I129 Hypertensive chronic kidney disease with stage 1 through stage 4 chronic kidney disease, or unspecified chronic kidney disease: Secondary | ICD-10-CM | POA: Diagnosis not present

## 2022-06-07 DIAGNOSIS — G629 Polyneuropathy, unspecified: Secondary | ICD-10-CM | POA: Diagnosis not present

## 2022-06-07 DIAGNOSIS — Z940 Kidney transplant status: Secondary | ICD-10-CM | POA: Diagnosis not present

## 2022-06-13 DIAGNOSIS — E1140 Type 2 diabetes mellitus with diabetic neuropathy, unspecified: Secondary | ICD-10-CM | POA: Diagnosis not present

## 2022-06-23 DIAGNOSIS — Z9483 Pancreas transplant status: Secondary | ICD-10-CM | POA: Diagnosis not present

## 2022-06-23 DIAGNOSIS — D849 Immunodeficiency, unspecified: Secondary | ICD-10-CM | POA: Diagnosis not present

## 2022-06-23 DIAGNOSIS — Z940 Kidney transplant status: Secondary | ICD-10-CM | POA: Diagnosis not present

## 2022-07-18 DIAGNOSIS — E1142 Type 2 diabetes mellitus with diabetic polyneuropathy: Secondary | ICD-10-CM | POA: Diagnosis not present

## 2022-07-18 DIAGNOSIS — Z79899 Other long term (current) drug therapy: Secondary | ICD-10-CM | POA: Diagnosis not present

## 2022-07-18 DIAGNOSIS — Z5181 Encounter for therapeutic drug level monitoring: Secondary | ICD-10-CM | POA: Diagnosis not present

## 2022-07-19 DIAGNOSIS — Z940 Kidney transplant status: Secondary | ICD-10-CM | POA: Diagnosis not present

## 2022-07-19 DIAGNOSIS — Z9483 Pancreas transplant status: Secondary | ICD-10-CM | POA: Diagnosis not present

## 2022-07-19 DIAGNOSIS — D849 Immunodeficiency, unspecified: Secondary | ICD-10-CM | POA: Diagnosis not present

## 2022-08-31 DIAGNOSIS — Z79899 Other long term (current) drug therapy: Secondary | ICD-10-CM | POA: Diagnosis not present

## 2022-08-31 DIAGNOSIS — Z23 Encounter for immunization: Secondary | ICD-10-CM | POA: Diagnosis not present

## 2022-08-31 DIAGNOSIS — Z940 Kidney transplant status: Secondary | ICD-10-CM | POA: Diagnosis not present

## 2022-08-31 DIAGNOSIS — I10 Essential (primary) hypertension: Secondary | ICD-10-CM | POA: Diagnosis not present

## 2022-08-31 DIAGNOSIS — B348 Other viral infections of unspecified site: Secondary | ICD-10-CM | POA: Diagnosis not present

## 2022-08-31 DIAGNOSIS — R61 Generalized hyperhidrosis: Secondary | ICD-10-CM | POA: Diagnosis not present

## 2022-08-31 DIAGNOSIS — G629 Polyneuropathy, unspecified: Secondary | ICD-10-CM | POA: Diagnosis not present

## 2022-08-31 DIAGNOSIS — Z9483 Pancreas transplant status: Secondary | ICD-10-CM | POA: Diagnosis not present

## 2022-08-31 DIAGNOSIS — Z79621 Long term (current) use of calcineurin inhibitor: Secondary | ICD-10-CM | POA: Diagnosis not present

## 2022-08-31 DIAGNOSIS — Z7952 Long term (current) use of systemic steroids: Secondary | ICD-10-CM | POA: Diagnosis not present

## 2022-08-31 DIAGNOSIS — E1140 Type 2 diabetes mellitus with diabetic neuropathy, unspecified: Secondary | ICD-10-CM | POA: Diagnosis not present

## 2022-08-31 DIAGNOSIS — D849 Immunodeficiency, unspecified: Secondary | ICD-10-CM | POA: Diagnosis not present

## 2022-08-31 DIAGNOSIS — Z48288 Encounter for aftercare following multiple organ transplant: Secondary | ICD-10-CM | POA: Diagnosis not present

## 2022-08-31 DIAGNOSIS — I7389 Other specified peripheral vascular diseases: Secondary | ICD-10-CM | POA: Diagnosis not present

## 2022-08-31 DIAGNOSIS — Z5181 Encounter for therapeutic drug level monitoring: Secondary | ICD-10-CM | POA: Diagnosis not present

## 2022-08-31 DIAGNOSIS — B349 Viral infection, unspecified: Secondary | ICD-10-CM | POA: Diagnosis not present

## 2022-08-31 DIAGNOSIS — Z79624 Long term (current) use of inhibitors of nucleotide synthesis: Secondary | ICD-10-CM | POA: Diagnosis not present

## 2022-09-14 DIAGNOSIS — E1169 Type 2 diabetes mellitus with other specified complication: Secondary | ICD-10-CM | POA: Diagnosis not present

## 2022-09-14 DIAGNOSIS — Z125 Encounter for screening for malignant neoplasm of prostate: Secondary | ICD-10-CM | POA: Diagnosis not present

## 2022-09-14 DIAGNOSIS — I10 Essential (primary) hypertension: Secondary | ICD-10-CM | POA: Diagnosis not present

## 2022-09-14 DIAGNOSIS — R7989 Other specified abnormal findings of blood chemistry: Secondary | ICD-10-CM | POA: Diagnosis not present

## 2022-09-14 DIAGNOSIS — E785 Hyperlipidemia, unspecified: Secondary | ICD-10-CM | POA: Diagnosis not present

## 2022-09-19 DIAGNOSIS — D84821 Immunodeficiency due to drugs: Secondary | ICD-10-CM | POA: Diagnosis not present

## 2022-09-19 DIAGNOSIS — F419 Anxiety disorder, unspecified: Secondary | ICD-10-CM | POA: Diagnosis not present

## 2022-09-19 DIAGNOSIS — Z1339 Encounter for screening examination for other mental health and behavioral disorders: Secondary | ICD-10-CM | POA: Diagnosis not present

## 2022-09-19 DIAGNOSIS — K219 Gastro-esophageal reflux disease without esophagitis: Secondary | ICD-10-CM | POA: Diagnosis not present

## 2022-09-19 DIAGNOSIS — I2510 Atherosclerotic heart disease of native coronary artery without angina pectoris: Secondary | ICD-10-CM | POA: Diagnosis not present

## 2022-09-19 DIAGNOSIS — G609 Hereditary and idiopathic neuropathy, unspecified: Secondary | ICD-10-CM | POA: Diagnosis not present

## 2022-09-19 DIAGNOSIS — Z0000 Encounter for general adult medical examination without abnormal findings: Secondary | ICD-10-CM | POA: Diagnosis not present

## 2022-09-19 DIAGNOSIS — I10 Essential (primary) hypertension: Secondary | ICD-10-CM | POA: Diagnosis not present

## 2022-09-19 DIAGNOSIS — Z1331 Encounter for screening for depression: Secondary | ICD-10-CM | POA: Diagnosis not present

## 2022-09-19 DIAGNOSIS — Z9483 Pancreas transplant status: Secondary | ICD-10-CM | POA: Diagnosis not present

## 2022-09-19 DIAGNOSIS — R82998 Other abnormal findings in urine: Secondary | ICD-10-CM | POA: Diagnosis not present

## 2022-09-19 DIAGNOSIS — E1169 Type 2 diabetes mellitus with other specified complication: Secondary | ICD-10-CM | POA: Diagnosis not present

## 2022-09-19 DIAGNOSIS — E785 Hyperlipidemia, unspecified: Secondary | ICD-10-CM | POA: Diagnosis not present

## 2022-10-03 DIAGNOSIS — Z48288 Encounter for aftercare following multiple organ transplant: Secondary | ICD-10-CM | POA: Diagnosis not present

## 2022-10-03 DIAGNOSIS — R7989 Other specified abnormal findings of blood chemistry: Secondary | ICD-10-CM | POA: Diagnosis not present

## 2022-10-03 DIAGNOSIS — Z5181 Encounter for therapeutic drug level monitoring: Secondary | ICD-10-CM | POA: Diagnosis not present

## 2022-10-03 DIAGNOSIS — Z79621 Long term (current) use of calcineurin inhibitor: Secondary | ICD-10-CM | POA: Diagnosis not present

## 2022-10-17 DIAGNOSIS — E1142 Type 2 diabetes mellitus with diabetic polyneuropathy: Secondary | ICD-10-CM | POA: Diagnosis not present

## 2022-11-02 DIAGNOSIS — R5383 Other fatigue: Secondary | ICD-10-CM | POA: Diagnosis not present

## 2022-11-02 DIAGNOSIS — R059 Cough, unspecified: Secondary | ICD-10-CM | POA: Diagnosis not present

## 2022-11-02 DIAGNOSIS — Z1152 Encounter for screening for COVID-19: Secondary | ICD-10-CM | POA: Diagnosis not present

## 2022-11-02 DIAGNOSIS — D84821 Immunodeficiency due to drugs: Secondary | ICD-10-CM | POA: Diagnosis not present

## 2022-11-02 DIAGNOSIS — Z5181 Encounter for therapeutic drug level monitoring: Secondary | ICD-10-CM | POA: Diagnosis not present

## 2022-11-02 DIAGNOSIS — R7989 Other specified abnormal findings of blood chemistry: Secondary | ICD-10-CM | POA: Diagnosis not present

## 2022-11-02 DIAGNOSIS — R0981 Nasal congestion: Secondary | ICD-10-CM | POA: Diagnosis not present

## 2022-11-02 DIAGNOSIS — Z48288 Encounter for aftercare following multiple organ transplant: Secondary | ICD-10-CM | POA: Diagnosis not present

## 2022-11-02 DIAGNOSIS — J209 Acute bronchitis, unspecified: Secondary | ICD-10-CM | POA: Diagnosis not present

## 2022-11-02 DIAGNOSIS — Z79621 Long term (current) use of calcineurin inhibitor: Secondary | ICD-10-CM | POA: Diagnosis not present

## 2022-11-02 DIAGNOSIS — E1169 Type 2 diabetes mellitus with other specified complication: Secondary | ICD-10-CM | POA: Diagnosis not present

## 2022-11-10 DIAGNOSIS — D849 Immunodeficiency, unspecified: Secondary | ICD-10-CM | POA: Diagnosis not present

## 2022-11-10 DIAGNOSIS — R61 Generalized hyperhidrosis: Secondary | ICD-10-CM | POA: Diagnosis not present

## 2022-11-10 DIAGNOSIS — B348 Other viral infections of unspecified site: Secondary | ICD-10-CM | POA: Diagnosis not present

## 2022-11-10 DIAGNOSIS — Z9483 Pancreas transplant status: Secondary | ICD-10-CM | POA: Diagnosis not present

## 2022-11-10 DIAGNOSIS — R948 Abnormal results of function studies of other organs and systems: Secondary | ICD-10-CM | POA: Diagnosis not present

## 2022-11-10 DIAGNOSIS — Z940 Kidney transplant status: Secondary | ICD-10-CM | POA: Diagnosis not present

## 2022-11-16 DIAGNOSIS — R948 Abnormal results of function studies of other organs and systems: Secondary | ICD-10-CM | POA: Diagnosis not present

## 2022-11-16 DIAGNOSIS — D849 Immunodeficiency, unspecified: Secondary | ICD-10-CM | POA: Diagnosis not present

## 2022-11-16 DIAGNOSIS — Z940 Kidney transplant status: Secondary | ICD-10-CM | POA: Diagnosis not present

## 2022-11-16 DIAGNOSIS — R61 Generalized hyperhidrosis: Secondary | ICD-10-CM | POA: Diagnosis not present

## 2022-12-06 DIAGNOSIS — J0100 Acute maxillary sinusitis, unspecified: Secondary | ICD-10-CM | POA: Diagnosis not present

## 2022-12-06 DIAGNOSIS — R058 Other specified cough: Secondary | ICD-10-CM | POA: Diagnosis not present

## 2023-01-03 DIAGNOSIS — Z79899 Other long term (current) drug therapy: Secondary | ICD-10-CM | POA: Diagnosis not present

## 2023-01-03 DIAGNOSIS — B348 Other viral infections of unspecified site: Secondary | ICD-10-CM | POA: Diagnosis not present

## 2023-01-03 DIAGNOSIS — R61 Generalized hyperhidrosis: Secondary | ICD-10-CM | POA: Diagnosis not present

## 2023-01-03 DIAGNOSIS — Z9483 Pancreas transplant status: Secondary | ICD-10-CM | POA: Diagnosis not present

## 2023-01-03 DIAGNOSIS — Z48288 Encounter for aftercare following multiple organ transplant: Secondary | ICD-10-CM | POA: Diagnosis not present

## 2023-01-03 DIAGNOSIS — Z940 Kidney transplant status: Secondary | ICD-10-CM | POA: Diagnosis not present

## 2023-01-03 DIAGNOSIS — D849 Immunodeficiency, unspecified: Secondary | ICD-10-CM | POA: Diagnosis not present

## 2023-01-05 DIAGNOSIS — G629 Polyneuropathy, unspecified: Secondary | ICD-10-CM | POA: Diagnosis not present

## 2023-01-05 DIAGNOSIS — I129 Hypertensive chronic kidney disease with stage 1 through stage 4 chronic kidney disease, or unspecified chronic kidney disease: Secondary | ICD-10-CM | POA: Diagnosis not present

## 2023-01-05 DIAGNOSIS — Z9483 Pancreas transplant status: Secondary | ICD-10-CM | POA: Diagnosis not present

## 2023-01-05 DIAGNOSIS — I739 Peripheral vascular disease, unspecified: Secondary | ICD-10-CM | POA: Diagnosis not present

## 2023-01-05 DIAGNOSIS — Z940 Kidney transplant status: Secondary | ICD-10-CM | POA: Diagnosis not present

## 2023-01-05 DIAGNOSIS — B348 Other viral infections of unspecified site: Secondary | ICD-10-CM | POA: Diagnosis not present

## 2023-02-06 DIAGNOSIS — Z940 Kidney transplant status: Secondary | ICD-10-CM | POA: Diagnosis not present

## 2023-02-06 DIAGNOSIS — Z9483 Pancreas transplant status: Secondary | ICD-10-CM | POA: Diagnosis not present

## 2023-02-07 NOTE — Progress Notes (Addendum)
[facility] Note  02/08/2023     CHIEF COMPLAINT Patient presents for Retina Evaluation   HISTORY OF PRESENT ILLNESS: Gregory Allen is a 52 y.o. male who presents to the clinic today for:   HPI     Retina Evaluation   In both eyes.  This started 2 days ago.  Duration of 2 days.  I, the attending physician,  performed the HPI with the patient and updated documentation appropriately.        Comments   Pt here for ret eval referred by [facility] here in [place] for bleeding in the back of eyes. Pt states he went in on 4/1 for a regular eye exam and was then told to contact us immediately. No changes in vision noticed recently. No floaters or FOL. Pt has a hx of diabetes but had a kidney and pancreatic transplant 2016. Only takes immuno suppresants. Pt also reports hx of laser in back of his eyes for bleeding, back in 2005.       Last edited by Bernarda Caffey, MD on 02/08/2023 11:30 AM.    Pt is here on the referral of [facility] for concern of PDR, pt has had previous laser in [place], [place] and [place], [place], he states it has been a long time since he had the laser (around 2005), he states since then he has not seen any retina specialists bc he has not had any problems, pt has never had injections, pt states his vision is good, he has not had any changes recently, pt states he has had previous bleeding in the back of his eyes, pt states the dr at [facility] told him he needed to be seen "immediately"  Referring physician: Velna Hatchet, MD [place],  [place] [address]  HISTORICAL INFORMATION:   Selected notes from the MEDICAL RECORD NUMBER Referred by [facility] for diabetic retinopathy eval LEE:  Ocular Hx- history of PRP OU in [place] and [place], [place] PMH-    CURRENT MEDICATIONS: Current Outpatient Medications (Ophthalmic Drugs)  Medication Sig   prednisoLONE acetate (PRED FORTE) 1 % ophthalmic suspension Place 1 drop into the  left eye 4 (four) times daily for 7 days.   No current facility-administered medications for this visit. (Ophthalmic Drugs)   Current Outpatient Medications (Other)  Medication Sig   acetaminophen (TYLENOL) 500 MG tablet Take 1,000 mg by mouth every 6 (six) hours as needed for moderate pain or headache.   amLODipine (NORVASC) 10 MG tablet Take 10 mg by mouth daily.    aspirin EC 81 MG tablet Take 81 mg by mouth daily.    Capsaicin 0.1 % CREA Apply 1 application topically daily as needed (pain).   carvedilol (COREG) 12.5 MG tablet Take 12.5 mg by mouth 2 (two) times daily with a meal.   DULoxetine (CYMBALTA) 30 MG capsule TAKE 2 CAPSULES BY MOUTH AFTER BREAKFAST AND 1 CAPSULE EVERY EVENING   gabapentin (NEURONTIN) 300 MG capsule Take 600 mg by mouth 2 (two) times daily.   Magnesium 400 MG TABS Take 400 mg by mouth daily.   mycophenolate (MYFORTIC) 180 MG EC tablet Take 3 tablets (540 mg total) by mouth in the morning AND 2 tablets (360 mg total) every evening.   pentoxifylline (TRENTAL) 400 MG CR tablet Take 400 mg by mouth 2 (two) times daily.   predniSONE (DELTASONE) 5 MG tablet Take 1 tablet by mouth once a day   rosuvastatin (CRESTOR)  5 MG tablet Take 1 tablet by mouth every Monday, Wednesday, and Friday.   sulfamethoxazole-trimethoprim (BACTRIM) 400-80 MG tablet TAKE 1 TABLET BY MOUTH EVERY MONDAY, WEDNESDAY, FRIDAY.   tacrolimus (PROGRAF) 1 MG capsule Take 5 mg by mouth daily. 3 capsules in the am, 2 at bedtime.   No current facility-administered medications for this visit. (Other)   REVIEW OF SYSTEMS: ROS   Positive for: Endocrine, Cardiovascular, Eyes Negative for: Constitutional, Gastrointestinal, Neurological, Skin, Genitourinary, Musculoskeletal, HENT, Respiratory, Psychiatric, Allergic/Imm, Heme/Lymph Last edited by Kingsley Spittle, COT on 02/08/2023  8:41 AM.     ALLERGIES Allergies  Allergen Reactions   Shellfish-Derived Products Itching    "throat starts itching" pt  states "he is ok with iodine"   Ivp Dye [Iodinated Contrast Media]     Has had kidney transplant   PAST MEDICAL HISTORY Past Medical History:  Diagnosis Date   Arthritis    left knee   Diabetes mellitus without complication    no meds since pancreatic transplant 2018   Hypertension    Kidney transplant recipient 2018   PAD (peripheral artery disease)    Pancreas transplant status 2018   Peripheral neuropathy    Past Surgical History:  Procedure Laterality Date   ABDOMINAL AORTOGRAM W/LOWER EXTREMITY Bilateral 06/12/2020   Procedure: ABDOMINAL AORTOGRAM W/LOWER EXTREMITY;  Surgeon: Elam Dutch, MD;  Location: [facility];  Service: Cardiovascular;  Laterality: Bilateral;  Bilateral    ANKLE SURGERY Right    BACK SURGERY     FOOT SURGERY Left    HAND SURGERY Right    thumb   KIDNEY TRANSPLANT     TRANSPLANT PANCREATIC ALLOGRAFT     ULNAR TUNNEL RELEASE Right 10/28/2020   Procedure: RIGHT CUBITAL TUNNEL RELEASE, TRANSPOSITION;  Surgeon: Leandrew Koyanagi, MD;  Location: [facility];  Service: Orthopedics;  Laterality: Right;   FAMILY HISTORY Family History  Problem Relation Age of Onset   Diabetes Mother    Hypertension Mother    Diabetes Father    Hypertension Father    Colon cancer Neg Hx    Colon polyps Neg Hx    SOCIAL HISTORY Social History   Tobacco Use   Smoking status: Never   Smokeless tobacco: Never  Vaping Use   Vaping Use: Never used  Substance Use Topics   Alcohol use: No    Alcohol/week: 0.0 standard drinks of alcohol   Drug use: No       OPHTHALMIC EXAM:  Base Eye Exam     Visual Acuity (Snellen - Linear)       Right Left   Dist cc 20/20 20/20 -2    Correction: Glasses         Tonometry (Tonopen, 8:47 AM)       Right Left   Pressure 14 14         Pupils       Pupils Dark Light Shape React APD   Right PERRL 3 2 Round Brisk None   Left PERRL 3 2 Round Brisk None         Visual Fields (Counting  fingers)       Left Right    Full Full         Extraocular Movement       Right Left    Full, Ortho Full, Ortho         Neuro/Psych     Oriented x3: Yes   Mood/Affect: Normal  Dilation     Both eyes: 1.0% Mydriacyl, 2.5% Phenylephrine @ 8:47 AM           Slit Lamp and Fundus Exam     Slit Lamp Exam       Right Left   Lids/Lashes Normal Normal   Conjunctiva/Sclera mild melanosis mild melanosis   Cornea Clear Clear   Anterior Chamber deep and clear deep and clear   Iris Round and dilated Round and dilated   Lens 2+ Nuclear sclerosis, 2+ Cortical cataract 2+ Nuclear sclerosis, 2+ Cortical cataract   Anterior Vitreous Vitreous syneresis Vitreous syneresis         Fundus Exam       Right Left   Disc Pink and Sharp, no NVD Pink and Sharp, no NVD   C/D Ratio 0.3 0.3   Macula Flat, Good foveal reflex, scattered MA/DBH greatest temporal mac Flat, Good foveal reflex, mild MA/DBH greatest temporal mac   Vessels attenuated, Tortuous, fine NVE temporal mac at posterior border of laser attenuated, Tortuous, NV temporal mac at posterior border of laser   Periphery Attached, good 360 laser, focal NVE at temporal posterior border of laser Attached, 360 PRP with room for fill in, focal NVE at temporal posterior border of laser           Refraction     Wearing Rx       Sphere Cylinder Axis   Right -3.00 +0.50 176   Left -2.25 +0.75 002  Rx specs 52 year old           IMAGING AND PROCEDURES  Imaging and Procedures for 02/08/2023  OCT, Retina - OU - Both Eyes       Right Eye Quality was good. Central Foveal Thickness: 263. Progression has no prior data. Findings include normal foveal contour, no IRF, no SRF, vitreomacular adhesion (Trace perifoveal cystic changes, patchy peripheral ORA corresponding to PRP laser).   Left Eye Quality was good. Central Foveal Thickness: 249. Progression has no prior data. Findings include normal foveal contour, no  SRF, intraretinal fluid (Mild focal IRF ST mac).   Notes *Images captured and stored on drive  Diagnosis / Impression:  OD: Trace perifoveal cystic changes, patchy peripheral ORA corresponding to PRP laser OS: Mild focal IRF ST mac  Clinical management:  See below  Abbreviations: NFP - Normal foveal profile. CME - cystoid macular edema. PED - pigment epithelial detachment. IRF - intraretinal fluid. SRF - subretinal fluid. EZ - ellipsoid zone. ERM - epiretinal membrane. ORA - outer retinal atrophy. ORT - outer retinal tubulation. SRHM - subretinal hyper-reflective material. IRHM - intraretinal hyper-reflective material      Fluorescein Angiography Optos (Transit OS)       Right Eye Progression has no prior data. Early phase findings include staining, microaneurysm, retinal neovascularization, vascular perfusion defect (3 focal patches of NVE at temporal posterior border of PRP laser). Mid/Late phase findings include leakage, staining, microaneurysm, retinal neovascularization, vascular perfusion defect (3 focal patches of NVE at temporal posterior border of PRP laser -- +leakage).   Left Eye Progression has no prior data. Early phase findings include delayed filling, leakage, staining, microaneurysm, retinal neovascularization, vascular perfusion defect (focal patches of NVE at temporal posterior border of PRP laser). Mid/Late phase findings include leakage, staining, microaneurysm, retinal neovascularization, vascular perfusion defect (focal patches of NVE at temporal posterior border of PRP laser -- +leakage; peripheral vascular perfusion defects with inadequate laser).   Notes **Images stored on drive**  Impression: OD: 3  focal patches of NVE at temporal posterior border of PRP laser -- +leakage OS: focal patches of NVE at temporal posterior border of PRP laser -- +leakage; peripheral vascular perfusion defects with inadequate laser      Panretinal Photocoagulation - OS - Left  Eye       LASER PROCEDURE NOTE  Diagnosis:   Proliferative Diabetic Retinopathy, LEFT EYE  Procedure:  Pan-retinal photocoagulation using slit lamp laser, LEFT EYE, fill-in  Anesthesia:  Topical  Surgeon: Bernarda Caffey, MD, PhD   Informed consent obtained, operative eye marked, and time out performed prior to initiation of laser.   [place] AY:[id number] slit lamp laser Pattern: 3x3 square Power: 380 mW Duration: 50 msec  Spot size: 200 microns  # spots: 1116 spots fill-in nasal and temporal periphery + temporal posterior border  Complications: None.  Notes: difficult visualization of peripheral visualization through cortical cataract  RTC: within 4 wks for PRP fill in OD  Patient tolerated the procedure well and received written and verbal post-procedure care information/education.            ASSESSMENT/PLAN:    ICD-10-CM   1. Proliferative diabetic retinopathy of both eyes with macular edema associated with type 2 diabetes mellitus  E11.3513 OCT, Retina - OU - Both Eyes    Fluorescein Angiography Optos (Transit OS)    Panretinal Photocoagulation - OS - Left Eye    2. Essential hypertension  I10     3. Hypertensive retinopathy of both eyes  H35.033 Fluorescein Angiography Optos (Transit OS)    4. Combined forms of age-related cataract of both eyes  H25.813       Proliferative diabetic retinopathy w/o DME, OU (OS > OD) - h/o PRP OU in [place], [place] and [place], [place] - referred here by [facility] -- pt has not been seen by Retina specialist since early 2000s - The incidence, risk factors for progression, natural history and treatment options for diabetic retinopathy were discussed with patient.   - The need for close monitoring of blood glucose, blood pressure, and serum lipids, avoiding cigarette or any type of tobacco, and the need for long term follow up was also discussed with patient. - exam shows focal NVE along temporal posterior border of PRP OU (OS > OD) w/  +IRH/DBH - FA (04.03.24) shows OD: 3 focal patches of NVE at temporal posterior border of PRP laser -- +leakage; OS: focal patches of NVE at temporal posterior border of PRP laser, peripheral vascular perfusion defects with inadequate laser -- pt would benefit from fill in PRP OU - OCT shows tr cystic changes / diabetic macular edema, both eyes  - discussed findings and prognosis - recommend PRP OU, OS first today, 04.03.24 - pt wishes to proceed with laser treatment this afternoon - RBA of procedure discussed, questions answered - informed consent obtained and signed - see procedure note - start PF QID OS x7 days - f/u in 1-3 wks for PRP OD  2,3. Hypertensive retinopathy OU - discussed importance of tight BP control - monitor  4. Mixed Cataract OU - The symptoms of cataract, surgical options, and treatments and risks were discussed with patient. - discussed diagnosis and progression - monitor   Ophthalmic Meds Ordered this visit:  Meds ordered this encounter  Medications   prednisoLONE acetate (PRED FORTE) 1 % ophthalmic suspension    Sig: Place 1 drop into the left eye 4 (four) times daily for 7 days.    Dispense:  10 mL    Refill:  0     Return in about 6 days (around 02/14/2023) for Laser PRP fill in OD.  There are no Patient Instructions on file for this visit.   Explained the diagnoses, plan, and follow up with the patient and they expressed understanding.  Patient expressed understanding of the importance of proper follow up care.    This document serves as a record of services personally performed by Gardiner Sleeper, MD, PhD. It was created on their behalf by Orvan Falconer, an ophthalmic technician. The creation of this record is the provider's dictation and/or activities during the visit.    Electronically signed by: Orvan Falconer, OA, 02/08/23  4:31 PM  This document serves as a record of services personally performed by Gardiner Sleeper, MD, PhD. It was created  on their behalf by San Jetty. Owens Shark, OA an ophthalmic technician. The creation of this record is the provider's dictation and/or activities during the visit.    Electronically signed by: San Jetty. Owens Shark, [place] 04.03.2024 4:31 PM   Gardiner Sleeper, M.D., Ph.D. Diseases & Surgery of the Retina and Vitreous [facility]  I have reviewed the above documentation for accuracy and completeness, and I agree with the above. Gardiner Sleeper, M.D., Ph.D. 02/08/23 4:31 PM   Abbreviations: M myopia (nearsighted); A astigmatism; H hyperopia (farsighted); P presbyopia; Mrx spectacle prescription;  CTL contact lenses; OD right eye; OS left eye; OU both eyes  XT exotropia; ET esotropia; PEK punctate epithelial keratitis; PEE punctate epithelial erosions; DES dry eye syndrome; MGD meibomian gland dysfunction; ATs artificial tears; PFAT's preservative free artificial tears; [place] nuclear sclerotic cataract; PSC posterior subcapsular cataract; ERM epi-retinal membrane; PVD posterior vitreous detachment; RD retinal detachment; DM diabetes mellitus; DR diabetic retinopathy; NPDR non-proliferative diabetic retinopathy; PDR proliferative diabetic retinopathy; CSME clinically significant macular edema; DME diabetic macular edema; dbh dot blot hemorrhages; CWS cotton wool spot; POAG primary open angle glaucoma; C/D cup-to-disc ratio; HVF humphrey visual field; GVF goldmann visual field; OCT optical coherence tomography; IOP intraocular pressure; BRVO Branch retinal vein occlusion; CRVO central retinal vein occlusion; CRAO central retinal artery occlusion; BRAO branch retinal artery occlusion; RT retinal tear; SB scleral buckle; PPV pars plana vitrectomy; VH Vitreous hemorrhage; PRP panretinal laser photocoagulation; IVK intravitreal kenalog; VMT vitreomacular traction; MH Macular hole;  NVD neovascularization of the disc; NVE neovascularization elsewhere; AREDS age related eye disease study; ARMD age related  macular degeneration; POAG primary open angle glaucoma; EBMD epithelial/anterior basement membrane dystrophy; ACIOL anterior chamber intraocular lens; IOL intraocular lens; PCIOL posterior chamber intraocular lens; Phaco/IOL phacoemulsification with intraocular lens placement; [place] photorefractive keratectomy; LASIK laser assisted in situ keratomileusis; HTN hypertension; DM diabetes mellitus; COPD chronic obstructive pulmonary disease

## 2023-02-08 ENCOUNTER — Encounter (INDEPENDENT_AMBULATORY_CARE_PROVIDER_SITE_OTHER): Payer: Self-pay | Admitting: Ophthalmology

## 2023-02-08 ENCOUNTER — Ambulatory Visit (INDEPENDENT_AMBULATORY_CARE_PROVIDER_SITE_OTHER): Payer: Medicare Other | Admitting: Ophthalmology

## 2023-02-08 DIAGNOSIS — E113513 Type 2 diabetes mellitus with proliferative diabetic retinopathy with macular edema, bilateral: Secondary | ICD-10-CM | POA: Diagnosis not present

## 2023-02-08 DIAGNOSIS — I10 Essential (primary) hypertension: Secondary | ICD-10-CM | POA: Diagnosis not present

## 2023-02-08 DIAGNOSIS — H3581 Retinal edema: Secondary | ICD-10-CM

## 2023-02-08 DIAGNOSIS — H25813 Combined forms of age-related cataract, bilateral: Secondary | ICD-10-CM | POA: Diagnosis not present

## 2023-02-08 DIAGNOSIS — H35033 Hypertensive retinopathy, bilateral: Secondary | ICD-10-CM

## 2023-02-08 MED ORDER — PREDNISOLONE ACETATE 1 % OP SUSP: 1.0000 [drp] | Freq: Four times a day (QID) | OPHTHALMIC | 0 refills | Status: AC

## 2023-02-09 NOTE — Progress Notes (Signed)
[facility] - Clinic Note  02/14/2023     CHIEF COMPLAINT Patient presents for Retina Follow Up   HISTORY OF PRESENT ILLNESS: Gregory Allen is a 52 y.o. male who presents to the clinic today for:   HPI     Retina Follow Up   Patient presents with  Diabetic Retinopathy.  In both eyes.  This started 6 days ago.  I, the attending physician,  performed the HPI with the patient and updated documentation appropriately.        Comments   Patient here for 6 days retina follow up for PRP OD. Patient states vision doing good. Doing ok. Has a little bit of eye pain OS. Using drops.      Last edited by Rennis Chris, MD on 02/14/2023  4:13 PM.     Pt is here for PRP OD  Referring physician: Alysia Penna, MD [address] [place],  [place] [address]  HISTORICAL INFORMATION:   Selected notes from the MEDICAL RECORD NUMBER Referred by [facility] for diabetic retinopathy eval LEE:  Ocular Hx- history of PRP OU in [place] and [place], [place] PMH-    CURRENT MEDICATIONS: Current Outpatient Medications (Ophthalmic Drugs)  Medication Sig   prednisoLONE acetate (PRED FORTE) 1 % ophthalmic suspension Place 1 drop into the left eye 4 (four) times daily for 7 days.   No current facility-administered medications for this visit. (Ophthalmic Drugs)   Current Outpatient Medications (Other)  Medication Sig   acetaminophen (TYLENOL) 500 MG tablet Take 1,000 mg by mouth every 6 (six) hours as needed for moderate pain or headache.   amLODipine (NORVASC) 10 MG tablet Take 10 mg by mouth daily.    aspirin EC 81 MG tablet Take 81 mg by mouth daily.    Capsaicin 0.1 % CREA Apply 1 application topically daily as needed (pain).   carvedilol (COREG) 12.5 MG tablet Take 12.5 mg by mouth 2 (two) times daily with a meal.   DULoxetine (CYMBALTA) 30 MG capsule TAKE 2 CAPSULES BY MOUTH AFTER BREAKFAST AND 1 CAPSULE EVERY EVENING   gabapentin (NEURONTIN) 300 MG capsule Take 600 mg  by mouth 2 (two) times daily.   Magnesium 400 MG TABS Take 400 mg by mouth daily.   mycophenolate (MYFORTIC) 180 MG EC tablet Take 3 tablets (540 mg total) by mouth in the morning AND 2 tablets (360 mg total) every evening.   pentoxifylline (TRENTAL) 400 MG CR tablet Take 400 mg by mouth 2 (two) times daily.   predniSONE (DELTASONE) 5 MG tablet Take 1 tablet by mouth once a day   rosuvastatin (CRESTOR) 5 MG tablet Take 1 tablet by mouth every Monday, Wednesday, and Friday.   sulfamethoxazole-trimethoprim (BACTRIM) 400-80 MG tablet TAKE 1 TABLET BY MOUTH EVERY MONDAY, WEDNESDAY, FRIDAY.   tacrolimus (PROGRAF) 1 MG capsule Take 5 mg by mouth daily. 3 capsules in the am, 2 at bedtime.   No current facility-administered medications for this visit. (Other)   REVIEW OF SYSTEMS: ROS   Positive for: Endocrine, Cardiovascular, Eyes Negative for: Constitutional, Gastrointestinal, Neurological, Skin, Genitourinary, Musculoskeletal, HENT, Respiratory, Psychiatric, Allergic/Imm, Heme/Lymph Last edited by Laddie Aquas, COA on 02/14/2023  2:27 PM.      ALLERGIES Allergies  Allergen Reactions   Shellfish-Derived Products Itching    "throat starts itching" pt states "he is ok with iodine"   Ivp Dye [Iodinated Contrast Media]     Has had kidney transplant   PAST MEDICAL HISTORY Past Medical History:  Diagnosis Date   Arthritis    left knee   Diabetes mellitus without complication    no meds since pancreatic transplant 2018   Hypertension    Kidney transplant recipient 2018   PAD (peripheral artery disease)    Pancreas transplant status 2018   Peripheral neuropathy    Past Surgical History:  Procedure Laterality Date   ABDOMINAL AORTOGRAM W/LOWER EXTREMITY Bilateral 06/12/2020   Procedure: ABDOMINAL AORTOGRAM W/LOWER EXTREMITY;  Surgeon: Sherren Kerns, MD;  Location: [facility];  Service: Cardiovascular;  Laterality: Bilateral;  Bilateral    ANKLE SURGERY Right    BACK SURGERY      FOOT SURGERY Left    HAND SURGERY Right    thumb   KIDNEY TRANSPLANT     TRANSPLANT PANCREATIC ALLOGRAFT     ULNAR TUNNEL RELEASE Right 10/28/2020   Procedure: RIGHT CUBITAL TUNNEL RELEASE, TRANSPOSITION;  Surgeon: Tarry Kos, MD;  Location: [facility];  Service: Orthopedics;  Laterality: Right;   FAMILY HISTORY Family History  Problem Relation Age of Onset   Diabetes Mother    Hypertension Mother    Diabetes Father    Hypertension Father    Colon cancer Neg Hx    Colon polyps Neg Hx    SOCIAL HISTORY Social History   Tobacco Use   Smoking status: Never   Smokeless tobacco: Never  Vaping Use   Vaping Use: Never used  Substance Use Topics   Alcohol use: No    Alcohol/week: 0.0 standard drinks of alcohol   Drug use: No       OPHTHALMIC EXAM:  Base Eye Exam     Visual Acuity (Snellen - Linear)       Right Left   Dist cc 20/20 20/20    Correction: Glasses         Tonometry (Tonopen, 2:25 PM)       Right Left   Pressure 12 09         Pupils       Dark Light Shape React APD   Right 3 2 Round Brisk None   Left 3 2 Round Brisk None         Visual Fields (Counting fingers)       Left Right    Full Full         Extraocular Movement       Right Left    Full, Ortho Full, Ortho         Neuro/Psych     Oriented x3: Yes   Mood/Affect: Normal         Dilation     Both eyes: 1.0% Mydriacyl, 2.5% Phenylephrine @ 2:25 PM           Slit Lamp and Fundus Exam     Slit Lamp Exam       Right Left   Lids/Lashes Normal Normal   Conjunctiva/Sclera mild melanosis mild melanosis   Cornea Clear Clear   Anterior Chamber deep and clear deep and clear   Iris Round and dilated Round and dilated   Lens 2+ Nuclear sclerosis, 2+ Cortical cataract 2+ Nuclear sclerosis, 2+ Cortical cataract   Anterior Vitreous Vitreous syneresis Vitreous syneresis         Fundus Exam       Right Left   Disc Pink and Sharp, no NVD Pink  and Sharp, no NVD   C/D Ratio 0.3 0.3   Macula Flat, Good foveal reflex, scattered MA/DBH greatest  temporal mac Flat, Good foveal reflex, mild MA/DBH greatest temporal mac   Vessels attenuated, Tortuous, fine NVE temporal mac at posterior border of laser attenuated, Tortuous, NV temporal mac at posterior border of laser   Periphery Attached, good 360 laser, focal NVE at temporal posterior border of laser Attached, 360 PRP with room for fill in, focal NVE at temporal posterior border of laser           Refraction     Wearing Rx       Sphere Cylinder Axis   Right -3.00 +0.50 176   Left -2.25 +0.75 002            IMAGING AND PROCEDURES  Imaging and Procedures for 02/14/2023  OCT, Retina - OU - Both Eyes       Right Eye Quality was good. Central Foveal Thickness: 268. Progression has been stable. Findings include normal foveal contour, no IRF, no SRF, vitreomacular adhesion (Trace perifoveal cystic changes, patchy peripheral ORA corresponding to PRP laser).   Left Eye Quality was good. Central Foveal Thickness: 255. Progression has been stable. Findings include normal foveal contour, no SRF, intraretinal fluid (Mild focal IRF ST mac).   Notes *Images captured and stored on drive  Diagnosis / Impression:  OD: Trace perifoveal cystic changes, patchy peripheral ORA corresponding to PRP laser OS: Mild focal IRF ST mac  Clinical management:  See below  Abbreviations: NFP - Normal foveal profile. CME - cystoid macular edema. PED - pigment epithelial detachment. IRF - intraretinal fluid. SRF - subretinal fluid. EZ - ellipsoid zone. ERM - epiretinal membrane. ORA - outer retinal atrophy. ORT - outer retinal tubulation. SRHM - subretinal hyper-reflective material. IRHM - intraretinal hyper-reflective material      Panretinal Photocoagulation - OD - Right Eye       LASER PROCEDURE NOTE  Diagnosis:   Proliferative Diabetic Retinopathy, RIGHT EYE  Procedure:  Pan-retinal  photocoagulation using slit lamp laser, RIGHT EYE, fill-in  Anesthesia:  Topical  Surgeon: Rennis Chris, MD, PhD   Informed consent obtained, operative eye marked, and time out performed prior to initiation of laser.   Lumenis [id number] slit lamp laser Pattern: 2x2 square Power: 340 mW Duration: 30 msec  Spot size: 200 microns  # spots: 291 spots at posterior border of prior PRP, temporal quadrant  Complications: None.  RTC: 3 mos -- DFE/OCT, repeat FA  Patient tolerated the procedure well and received written and verbal post-procedure care information/education.            ASSESSMENT/PLAN:    ICD-10-CM   1. Proliferative diabetic retinopathy of both eyes with macular edema associated with type 2 diabetes mellitus  E11.3513 OCT, Retina - OU - Both Eyes    Panretinal Photocoagulation - OD - Right Eye    2. Essential hypertension  I10     3. Hypertensive retinopathy of both eyes  H35.033     4. Combined forms of age-related cataract of both eyes  H25.813      Proliferative diabetic retinopathy w/o DME, OU (OS > OD)  - h/o PRP OU in [place], [place] and [place], [place] - referred here by [facility] -- pt has not been seen by Retina specialist since early 2000s - exam shows focal NVE along temporal posterior border of PRP OU (OS > OD) w/ +IRH/DBH - FA (04.03.24) shows OD: 3 focal patches of NVE at temporal posterior border of PRP laser -- +leakage; OS: focal patches of NVE at temporal posterior border of PRP laser,  peripheral vascular perfusion defects with inadequate laser -- pt would benefit from fill in PRP OU - s/p PRP OS (04.03.24) - BCVA 20/20 OU -- stable - OCT shows tr cystic changes / diabetic macular edema, both eyes  - recommend fill in PRP OD today, 04.09.24 - pt wishes to proceed with laser treatment  - RBA of procedure discussed, questions answered - informed consent obtained and signed - see procedure note - start PF QID OD x7 days - f/u in 3 mos for DFE, OCT,  repeat FA  2,3. Hypertensive retinopathy OU - discussed importance of tight BP control - monitor  4. Mixed Cataract OU - The symptoms of cataract, surgical options, and treatments and risks were discussed with patient. - discussed diagnosis and progression - monitor   Ophthalmic Meds Ordered this visit:  No orders of the defined types were placed in this encounter.    Return in about 3 months (around 05/16/2023) for PDR OU, Dilated Exam, OCT, Fluorescein Angiogram.  There are no Patient Instructions on file for this visit.   Explained the diagnoses, plan, and follow up with the patient and they expressed understanding.  Patient expressed understanding of the importance of proper follow up care.   This document serves as a record of services personally performed by Karie Chimera, MD, PhD. It was created on their behalf by Gerilyn Nestle, COT an ophthalmic technician. The creation of this record is the provider's dictation and/or activities during the visit.    Electronically signed by:  Gerilyn Nestle, COT  04.04.24 4:20 PM  This document serves as a record of services personally performed by Karie Chimera, MD, PhD. It was created on their behalf by Glee Arvin. Manson Passey, OA an ophthalmic technician. The creation of this record is the provider's dictation and/or activities during the visit.    Electronically signed by: Glee Arvin. Manson Passey, [place] 04.09.2024 4:20 PM  Karie Chimera, M.D., Ph.D. Diseases & Surgery of the Retina and Vitreous [facility]  I have reviewed the above documentation for accuracy and completeness, and I agree with the above. Karie Chimera, M.D., Ph.D. 02/14/23 4:22 PM  Abbreviations: M myopia (nearsighted); A astigmatism; H hyperopia (farsighted); P presbyopia; Mrx spectacle prescription;  CTL contact lenses; OD right eye; OS left eye; OU both eyes  XT exotropia; ET esotropia; PEK punctate epithelial keratitis; PEE punctate epithelial  erosions; DES dry eye syndrome; MGD meibomian gland dysfunction; ATs artificial tears; PFAT's preservative free artificial tears; NSC nuclear sclerotic cataract; PSC posterior subcapsular cataract; ERM epi-retinal membrane; PVD posterior vitreous detachment; RD retinal detachment; DM diabetes mellitus; DR diabetic retinopathy; NPDR non-proliferative diabetic retinopathy; PDR proliferative diabetic retinopathy; CSME clinically significant macular edema; DME diabetic macular edema; dbh dot blot hemorrhages; CWS cotton wool spot; POAG primary open angle glaucoma; C/D cup-to-disc ratio; HVF humphrey visual field; GVF goldmann visual field; OCT optical coherence tomography; IOP intraocular pressure; BRVO Branch retinal vein occlusion; CRVO central retinal vein occlusion; CRAO central retinal artery occlusion; BRAO branch retinal artery occlusion; RT retinal tear; SB scleral buckle; PPV pars plana vitrectomy; VH Vitreous hemorrhage; PRP panretinal laser photocoagulation; IVK intravitreal kenalog; VMT vitreomacular traction; MH Macular hole;  NVD neovascularization of the disc; NVE neovascularization elsewhere; AREDS age related eye disease study; ARMD age related macular degeneration; POAG primary open angle glaucoma; EBMD epithelial/anterior basement membrane dystrophy; ACIOL anterior chamber intraocular lens; IOL intraocular lens; PCIOL posterior chamber intraocular lens; Phaco/IOL phacoemulsification with intraocular lens placement; PRK photorefractive keratectomy; LASIK  laser assisted in situ keratomileusis; HTN hypertension; DM diabetes mellitus; COPD chronic obstructive pulmonary disease

## 2023-02-14 ENCOUNTER — Encounter (INDEPENDENT_AMBULATORY_CARE_PROVIDER_SITE_OTHER): Payer: Self-pay | Admitting: Ophthalmology

## 2023-02-14 ENCOUNTER — Ambulatory Visit (INDEPENDENT_AMBULATORY_CARE_PROVIDER_SITE_OTHER): Payer: Medicare Other | Admitting: Ophthalmology

## 2023-02-14 DIAGNOSIS — H25813 Combined forms of age-related cataract, bilateral: Secondary | ICD-10-CM

## 2023-02-14 DIAGNOSIS — H35033 Hypertensive retinopathy, bilateral: Secondary | ICD-10-CM | POA: Diagnosis not present

## 2023-02-14 DIAGNOSIS — E113513 Type 2 diabetes mellitus with proliferative diabetic retinopathy with macular edema, bilateral: Secondary | ICD-10-CM

## 2023-02-14 DIAGNOSIS — I10 Essential (primary) hypertension: Secondary | ICD-10-CM | POA: Diagnosis not present

## 2023-02-28 DIAGNOSIS — Z940 Kidney transplant status: Secondary | ICD-10-CM | POA: Diagnosis not present

## 2023-02-28 DIAGNOSIS — Z9483 Pancreas transplant status: Secondary | ICD-10-CM | POA: Diagnosis not present

## 2023-03-01 DIAGNOSIS — R0981 Nasal congestion: Secondary | ICD-10-CM | POA: Diagnosis not present

## 2023-03-01 DIAGNOSIS — I10 Essential (primary) hypertension: Secondary | ICD-10-CM | POA: Diagnosis not present

## 2023-03-01 DIAGNOSIS — R61 Generalized hyperhidrosis: Secondary | ICD-10-CM | POA: Diagnosis not present

## 2023-03-01 DIAGNOSIS — R350 Frequency of micturition: Secondary | ICD-10-CM | POA: Diagnosis not present

## 2023-03-01 DIAGNOSIS — Z79899 Other long term (current) drug therapy: Secondary | ICD-10-CM | POA: Diagnosis not present

## 2023-03-01 DIAGNOSIS — I158 Other secondary hypertension: Secondary | ICD-10-CM | POA: Diagnosis not present

## 2023-03-01 DIAGNOSIS — D849 Immunodeficiency, unspecified: Secondary | ICD-10-CM | POA: Diagnosis not present

## 2023-03-01 DIAGNOSIS — E1140 Type 2 diabetes mellitus with diabetic neuropathy, unspecified: Secondary | ICD-10-CM | POA: Diagnosis not present

## 2023-03-01 DIAGNOSIS — Z940 Kidney transplant status: Secondary | ICD-10-CM | POA: Diagnosis not present

## 2023-03-01 DIAGNOSIS — E13319 Other specified diabetes mellitus with unspecified diabetic retinopathy without macular edema: Secondary | ICD-10-CM | POA: Diagnosis not present

## 2023-03-01 DIAGNOSIS — Z79621 Long term (current) use of calcineurin inhibitor: Secondary | ICD-10-CM | POA: Diagnosis not present

## 2023-03-01 DIAGNOSIS — Z9483 Pancreas transplant status: Secondary | ICD-10-CM | POA: Diagnosis not present

## 2023-03-01 DIAGNOSIS — Z5181 Encounter for therapeutic drug level monitoring: Secondary | ICD-10-CM | POA: Diagnosis not present

## 2023-03-01 DIAGNOSIS — B348 Other viral infections of unspecified site: Secondary | ICD-10-CM | POA: Diagnosis not present

## 2023-03-15 DIAGNOSIS — Z940 Kidney transplant status: Secondary | ICD-10-CM | POA: Diagnosis not present

## 2023-03-26 ENCOUNTER — Encounter (HOSPITAL_BASED_OUTPATIENT_CLINIC_OR_DEPARTMENT_OTHER): Payer: Self-pay

## 2023-03-26 ENCOUNTER — Emergency Department (HOSPITAL_BASED_OUTPATIENT_CLINIC_OR_DEPARTMENT_OTHER)
Admission: EM | Admit: 2023-03-26 | Discharge: 2023-03-26 | Disposition: A | Discharge status: Home / Self Care | Payer: Commercial Managed Care - PPO | Attending: Emergency Medicine | Admitting: Emergency Medicine

## 2023-03-26 ENCOUNTER — Other Ambulatory Visit: Payer: Self-pay

## 2023-03-26 ENCOUNTER — Emergency Department (HOSPITAL_BASED_OUTPATIENT_CLINIC_OR_DEPARTMENT_OTHER): Payer: Commercial Managed Care - PPO | Admitting: Radiology

## 2023-03-26 DIAGNOSIS — S99921A Unspecified injury of right foot, initial encounter: Secondary | ICD-10-CM | POA: Diagnosis present

## 2023-03-26 DIAGNOSIS — M79674 Pain in right toe(s): Secondary | ICD-10-CM | POA: Diagnosis not present

## 2023-03-26 DIAGNOSIS — E1042 Type 1 diabetes mellitus with diabetic polyneuropathy: Secondary | ICD-10-CM | POA: Diagnosis not present

## 2023-03-26 DIAGNOSIS — Z7982 Long term (current) use of aspirin: Secondary | ICD-10-CM | POA: Insufficient documentation

## 2023-03-26 DIAGNOSIS — S92424A Nondisplaced fracture of distal phalanx of right great toe, initial encounter for closed fracture: Secondary | ICD-10-CM | POA: Diagnosis not present

## 2023-03-26 DIAGNOSIS — S92414A Nondisplaced fracture of proximal phalanx of right great toe, initial encounter for closed fracture: Secondary | ICD-10-CM | POA: Diagnosis not present

## 2023-03-26 DIAGNOSIS — S92404A Nondisplaced unspecified fracture of right great toe, initial encounter for closed fracture: Secondary | ICD-10-CM

## 2023-03-26 DIAGNOSIS — X501XXA Overexertion from prolonged static or awkward postures, initial encounter: Secondary | ICD-10-CM | POA: Insufficient documentation

## 2023-03-26 MED ORDER — HYDROCODONE-ACETAMINOPHEN 5-325 MG PO TABS
1.0000 | ORAL_TABLET | Freq: Once | ORAL | Status: AC
  Administered 2023-03-26: 1 via ORAL
  Filled 2023-03-26: qty 1

## 2023-03-26 MED ORDER — HYDROCODONE-ACETAMINOPHEN 5-325 MG PO TABS: 1.0000 | ORAL_TABLET | Freq: Four times a day (QID) | ORAL | 0 refills | Status: DC | PRN

## 2023-03-26 NOTE — Discharge Instructions (Addendum)
Your XR imaging revealed a toe fracture.  I would avoid NSAIDs given your history of renal transplant, Norco has been prescribed for pain control.  Please follow-up outpatient with orthopedics. FINDINGS:  Nondisplaced oblique fracture through the right great toe proximal  phalanx extending near the MTP joint. Small nondisplaced corner  fracture at the base of the right great toe distal phalanx. No  subluxation or dislocation. No additional fracture. Degenerative  changes in the midfoot and hindfoot. Vascular calcifications noted.    IMPRESSION:  Fractures through the proximal and distal phalanges of the right  great toe.   Recommend weight bearing as tolerated, apply a CAM boot, and follow-up outpatient with orthopedics

## 2023-03-26 NOTE — ED Provider Notes (Signed)
[place] EMERGENCY DEPARTMENT AT [facility] Provider Note   CSN: [id number] Arrival date & time: 03/26/23  4782     History  Chief Complaint  Patient presents with   Foot Injury    Gregory Allen is a 52 y.o. male.   Foot Injury    52 year old male with medical history significant for DM1, diabetic polyneuropathy, history of kidney and pancreas transplant in 2018, presenting to the emergency department with toe pain.  The patient states he was walking down steps when he took a misstep and landed funny on his right great toe.  He has had pain and swelling and pain with ambulation ever since.  Denies any other injuries or complaints.  The injury occurred while going down the steps at his home yesterday.  Home Medications Prior to Admission medications   Medication Sig Start Date End Date Taking? Authorizing Provider  HYDROcodone-acetaminophen (NORCO/VICODIN) 5-325 MG tablet Take 1-2 tablets by mouth every 6 (six) hours as needed. 03/26/23  Yes Ernie Avena, MD  acetaminophen (TYLENOL) 500 MG tablet Take 1,000 mg by mouth every 6 (six) hours as needed for moderate pain or headache.    [provider]  amLODipine (NORVASC) 10 MG tablet Take 10 mg by mouth daily.  05/14/18   [provider]  aspirin EC 81 MG tablet Take 81 mg by mouth daily.  04/14/17   [provider]  Capsaicin 0.1 % CREA Apply 1 application topically daily as needed (pain).    [provider]  carvedilol (COREG) 12.5 MG tablet Take 12.5 mg by mouth 2 (two) times daily with a meal.    [provider]  DULoxetine (CYMBALTA) 30 MG capsule TAKE 2 CAPSULES BY MOUTH AFTER BREAKFAST AND 1 CAPSULE EVERY EVENING 09/21/20 11/08/23  Bintrim, Corliss Parish, MD  gabapentin (NEURONTIN) 300 MG capsule Take 600 mg by mouth 2 (two) times daily.    [provider]  Magnesium 400 MG TABS Take 400 mg by mouth daily.    [provider]  mycophenolate (MYFORTIC) 180  MG EC tablet Take 3 tablets (540 mg total) by mouth in the morning AND 2 tablets (360 mg total) every evening. 04/12/21     pentoxifylline (TRENTAL) 400 MG CR tablet Take 400 mg by mouth 2 (two) times daily.    [provider]  predniSONE (DELTASONE) 5 MG tablet Take 1 tablet by mouth once a day 02/26/21     rosuvastatin (CRESTOR) 5 MG tablet Take 1 tablet by mouth every Monday, Wednesday, and Friday. 02/26/21     sulfamethoxazole-trimethoprim (BACTRIM) 400-80 MG tablet TAKE 1 TABLET BY MOUTH EVERY MONDAY, WEDNESDAY, FRIDAY. 06/02/21     tacrolimus (PROGRAF) 1 MG capsule Take 5 mg by mouth daily. 3 capsules in the am, 2 at bedtime.    [provider]      Allergies    Shellfish-derived products and Ivp dye [iodinated contrast media]    Review of Systems   Review of Systems  All other systems reviewed and are negative.   Physical Exam Updated Vital Signs BP (!) 143/87 (BP Location: Right Arm)   Pulse 75   Temp 98.4 F (36.9 C) (Oral)   Resp 16   Ht 5\' 10"  (1.778 m)   Wt 90.7 kg   SpO2 100%   BMI 28.70 kg/m  Physical Exam Vitals and nursing note reviewed.  Constitutional:      General: He is not in acute distress. HENT:     Head: Normocephalic and  atraumatic.  Eyes:     Conjunctiva/sclera: Conjunctivae normal.     Pupils: Pupils are equal, round, and reactive to light.  Cardiovascular:     Rate and Rhythm: Normal rate and regular rhythm.  Pulmonary:     Effort: Pulmonary effort is normal. No respiratory distress.  Abdominal:     General: There is no distension.     Tenderness: There is no guarding.  Musculoskeletal:        General: Swelling, tenderness and deformity present.     Cervical back: Neck supple.     Comments: Tenderness and swelling about the right first MTP joint   Skin:    Findings: No lesion or rash.  Neurological:     General: No focal deficit present.     Mental Status: He is alert. Mental status is at baseline.     ED Results /  Procedures / Treatments   Labs (all labs ordered are listed, but only abnormal results are displayed) Labs Reviewed - No data to display  EKG None  Radiology DG Foot Complete Right  Result Date: 03/26/2023 CLINICAL DATA:  Fall, stumbled.  Right great toe pain EXAM: RIGHT FOOT COMPLETE - 3+ VIEW COMPARISON:  None Available. FINDINGS: Nondisplaced oblique fracture through the right great toe proximal phalanx extending near the MTP joint. Small nondisplaced corner fracture at the base of the right great toe distal phalanx. No subluxation or dislocation. No additional fracture. Degenerative changes in the midfoot and hindfoot. Vascular calcifications noted. IMPRESSION: Fractures through the proximal and distal phalanges of the right great toe. Electronically Signed   By: Charlett Nose M.D.   On: 03/26/2023 08:24    Procedures Procedures    Medications Ordered in ED Medications  HYDROcodone-acetaminophen (NORCO/VICODIN) 5-325 MG per tablet 1 tablet (has no administration in time range)    ED Course/ Medical Decision Making/ A&P                             Medical Decision Making Amount and/or Complexity of Data Reviewed Radiology: ordered.  Risk Prescription drug management.    52 year old male with medical history significant for DM1, diabetic polyneuropathy, history of kidney and pancreas transplant in 2018, presenting to the emergency department with toe pain.  The patient states he was walking down steps when he took a misstep and landed funny on his right great toe.  He has had pain and swelling and pain with ambulation ever since.  Denies any other injuries or complaints.  The injury occurred while going down the steps at his home yesterday.  On arrival, the patient was vitally stable, physical exam concerning for tenderness and swelling about the right first MTP joint.  No other signs of trauma.  X-ray imaging of the foot was performed and revealed the following: FINDINGS:   Nondisplaced oblique fracture through the right great toe proximal  phalanx extending near the MTP joint. Small nondisplaced corner  fracture at the base of the right great toe distal phalanx. No  subluxation or dislocation. No additional fracture. Degenerative  changes in the midfoot and hindfoot. Vascular calcifications noted.    IMPRESSION:  Fractures through the proximal and distal phalanges of the right  great toe.   Patient was placed in a cam boot, advised outpatient follow-up with orthopedics, will prescribe Norco for pain control given the patient's history of renal transplant.   Final Clinical Impression(s) / ED Diagnoses Final diagnoses:  Closed nondisplaced fracture  of phalanx of right great toe, unspecified phalanx, initial encounter    Rx / DC Orders ED Discharge Orders          Ordered    Ambulatory referral to Orthopedics        03/26/23 0831    HYDROcodone-acetaminophen (NORCO/VICODIN) 5-325 MG tablet  Every 6 hours PRN        03/26/23 0837              Ernie Avena, MD 03/26/23 [phone]

## 2023-03-26 NOTE — ED Triage Notes (Signed)
He states he mis stepped whilst descending stairs at his home yesterday. He has pain.swelling and ecchymosis at #1 m.t.p. joint area.

## 2023-03-26 NOTE — ED Notes (Signed)
Cam Boot placed size large, patient declined/refused crutches.

## 2023-03-27 DIAGNOSIS — M2011 Hallux valgus (acquired), right foot: Secondary | ICD-10-CM | POA: Diagnosis not present

## 2023-04-10 DIAGNOSIS — Z940 Kidney transplant status: Secondary | ICD-10-CM | POA: Diagnosis not present

## 2023-04-11 DIAGNOSIS — Z940 Kidney transplant status: Secondary | ICD-10-CM | POA: Diagnosis not present

## 2023-04-24 DIAGNOSIS — M79674 Pain in right toe(s): Secondary | ICD-10-CM | POA: Diagnosis not present

## 2023-05-04 NOTE — Progress Notes (Signed)
[facility] - Clinic Note  05/09/2023     CHIEF COMPLAINT Patient presents for Retina Follow Up   HISTORY OF PRESENT ILLNESS: Gregory Allen is a 52 y.o. male who presents to the clinic today for:   HPI     Retina Follow Up   Patient presents with  Diabetic Retinopathy.  In both eyes.  This started weeks ago.  Duration of 3 months.  I, the attending physician,  performed the HPI with the patient and updated documentation appropriately.        Comments   Patient feels that the vision is the same. He is not using eye drops. He does not check his blood sugar.       Last edited by Rennis Chris, MD on 05/09/2023  5:31 PM.    Pt states vision is the same  Referring physician: Alysia Penna, MD [address] [place],  [place] [address]  HISTORICAL INFORMATION:   Selected notes from the MEDICAL RECORD NUMBER Referred by [facility] for diabetic retinopathy eval LEE:  Ocular Hx- history of PRP OU in [place] and [place], [place] PMH-    CURRENT MEDICATIONS: No current outpatient medications on file. (Ophthalmic Drugs)   No current facility-administered medications for this visit. (Ophthalmic Drugs)   Current Outpatient Medications (Other)  Medication Sig   acetaminophen (TYLENOL) 500 MG tablet Take 1,000 mg by mouth every 6 (six) hours as needed for moderate pain or headache.   amLODipine (NORVASC) 10 MG tablet Take 10 mg by mouth daily.    aspirin EC 81 MG tablet Take 81 mg by mouth daily.    Capsaicin 0.1 % CREA Apply 1 application topically daily as needed (pain).   carvedilol (COREG) 12.5 MG tablet Take 12.5 mg by mouth 2 (two) times daily with a meal.   DULoxetine (CYMBALTA) 30 MG capsule TAKE 2 CAPSULES BY MOUTH AFTER BREAKFAST AND 1 CAPSULE EVERY EVENING   gabapentin (NEURONTIN) 300 MG capsule Take 600 mg by mouth 2 (two) times daily.   HYDROcodone-acetaminophen (NORCO/VICODIN) 5-325 MG tablet Take 1-2 tablets by mouth every 6 (six) hours as  needed.   Magnesium 400 MG TABS Take 400 mg by mouth daily.   mycophenolate (MYFORTIC) 180 MG EC tablet Take 3 tablets (540 mg total) by mouth in the morning AND 2 tablets (360 mg total) every evening.   pentoxifylline (TRENTAL) 400 MG CR tablet Take 400 mg by mouth 2 (two) times daily.   predniSONE (DELTASONE) 5 MG tablet Take 1 tablet by mouth once a day   rosuvastatin (CRESTOR) 5 MG tablet Take 1 tablet by mouth every Monday, Wednesday, and Friday.   sulfamethoxazole-trimethoprim (BACTRIM) 400-80 MG tablet TAKE 1 TABLET BY MOUTH EVERY MONDAY, WEDNESDAY, FRIDAY.   tacrolimus (PROGRAF) 1 MG capsule Take 5 mg by mouth daily. 3 capsules in the am, 2 at bedtime.   No current facility-administered medications for this visit. (Other)   REVIEW OF SYSTEMS: ROS   Positive for: Endocrine, Cardiovascular, Eyes Negative for: Constitutional, Gastrointestinal, Neurological, Skin, Genitourinary, Musculoskeletal, HENT, Respiratory, Psychiatric, Allergic/Imm, Heme/Lymph Last edited by Charlette Caffey, COT on 05/09/2023  2:04 PM.       ALLERGIES Allergies  Allergen Reactions   Shellfish-Derived Products Itching    "throat starts itching" pt states "he is ok with iodine"   Ivp Dye [Iodinated Contrast Media]     Has had kidney transplant   PAST MEDICAL HISTORY Past Medical History:  Diagnosis Date   Arthritis  left knee   Diabetes mellitus without complication (HCC)    no meds since pancreatic transplant 2018   Hypertension    Kidney transplant recipient 2018   PAD (peripheral artery disease) (HCC)    Pancreas transplant status (HCC) 2018   Peripheral neuropathy    Past Surgical History:  Procedure Laterality Date   ABDOMINAL AORTOGRAM W/LOWER EXTREMITY Bilateral 06/12/2020   Procedure: ABDOMINAL AORTOGRAM W/LOWER EXTREMITY;  Surgeon: Sherren Kerns, MD;  Location: [facility];  Service: Cardiovascular;  Laterality: Bilateral;  Bilateral    ANKLE SURGERY Right    BACK SURGERY      FOOT SURGERY Left    HAND SURGERY Right    thumb   KIDNEY TRANSPLANT     TRANSPLANT PANCREATIC ALLOGRAFT     ULNAR TUNNEL RELEASE Right 10/28/2020   Procedure: RIGHT CUBITAL TUNNEL RELEASE, TRANSPOSITION;  Surgeon: Tarry Kos, MD;  Location: [facility];  Service: Orthopedics;  Laterality: Right;   FAMILY HISTORY Family History  Problem Relation Age of Onset   Diabetes Mother    Hypertension Mother    Diabetes Father    Hypertension Father    Colon cancer Neg Hx    Colon polyps Neg Hx    SOCIAL HISTORY Social History   Tobacco Use   Smoking status: Never   Smokeless tobacco: Never  Vaping Use   Vaping Use: Never used  Substance Use Topics   Alcohol use: No    Alcohol/week: 0.0 standard drinks of alcohol   Drug use: No       OPHTHALMIC EXAM:  Base Eye Exam     Visual Acuity (Snellen - Linear)       Right Left   Dist cc 20/20 20/20    Correction: Glasses         Tonometry (Tonopen, 2:06 PM)       Right Left   Pressure 13 13         Pupils       Dark Light Shape React APD   Right 3 2 Round Brisk None   Left 3 2 Round Brisk None         Visual Fields       Left Right    Full Full         Extraocular Movement       Right Left    Full, Ortho Full, Ortho         Neuro/Psych     Oriented x3: Yes   Mood/Affect: Normal         Dilation     Both eyes: 1.0% Mydriacyl, 2.5% Phenylephrine @ 2:04 PM           Slit Lamp and Fundus Exam     Slit Lamp Exam       Right Left   Lids/Lashes Normal Normal   Conjunctiva/Sclera mild melanosis mild melanosis   Cornea Clear Clear   Anterior Chamber deep and clear deep and clear   Iris Round and dilated Round and dilated   Lens 2+ Nuclear sclerosis, 2+ Cortical cataract 2+ Nuclear sclerosis, 2+ Cortical cataract   Anterior Vitreous Vitreous syneresis Vitreous syneresis         Fundus Exam       Right Left   Disc Pink and Sharp, no NVD Pink and Sharp, no NVD    C/D Ratio 0.3 0.3   Macula Flat, Good foveal reflex, scattered MA/DBH greatest temporal mac -- improved Flat, Good foveal  reflex, mild MA/DBH greatest temporal mac -- improved, mild subhyaloid heme inferior macula   Vessels attenuated, Tortuous, fine NVE temporal mac at posterior border of laser attenuated, Tortuous, NV temporal mac at posterior border of laser   Periphery Attached, good 360 laser, focal NVE at temporal posterior border of laser -- improved s/p PRP fill Attached, 360 PRP with good fill in, focal NVE at temporal posterior border of laser -- slightly improved           Refraction     Wearing Rx       Sphere Cylinder Axis   Right -3.00 +0.50 176   Left -2.25 +0.75 002            IMAGING AND PROCEDURES  Imaging and Procedures for 05/09/2023  OCT, Retina - OU - Both Eyes       Right Eye Quality was good. Central Foveal Thickness: 265. Progression has been stable. Findings include normal foveal contour, no IRF, no SRF, vitreomacular adhesion (patchy peripheral ORA corresponding to PRP laser).   Left Eye Quality was good. Central Foveal Thickness: 253. Progression has improved. Findings include normal foveal contour, no SRF, intraretinal fluid (Mild focal IRF ST mac).   Notes *Images captured and stored on drive  Diagnosis / Impression:  OD: NFP; no IRF/SRF; patchy peripheral ORA corresponding to PRP laser OS: NFP; no SRF; Mild focal IRF ST mac -- improved  Clinical management:  See below  Abbreviations: NFP - Normal foveal profile. CME - cystoid macular edema. PED - pigment epithelial detachment. IRF - intraretinal fluid. SRF - subretinal fluid. EZ - ellipsoid zone. ERM - epiretinal membrane. ORA - outer retinal atrophy. ORT - outer retinal tubulation. SRHM - subretinal hyper-reflective material. IRHM - intraretinal hyper-reflective material      Fluorescein Angiography Optos (Transit OS)       Right Eye Progression has improved. Early phase findings  include staining, microaneurysm, retinal neovascularization, vascular perfusion defect (3 focal patches of NVE at temporal posterior border of PRP laser). Mid/Late phase findings include leakage, staining, microaneurysm, retinal neovascularization, vascular perfusion defect (3 focal patches of NVE at temporal posterior border of PRP laser -- regressing with interval improvement in leakage).   Left Eye Progression has improved. Early phase findings include blockage, delayed filling, leakage, staining, microaneurysm, retinal neovascularization, vascular perfusion defect (focal patches of NVE at temporal posterior border of PRP laser). Mid/Late phase findings include blockage, leakage, staining, microaneurysm, retinal neovascularization, vascular perfusion defect (focal patches of NVE at temporal posterior border of PRP laser -- +leakage -- improved; peripheral vascular perfusion defects with good fill in laser).   Notes **Images stored on drive**  Impression: OD: 3 focal patches of NVE at temporal posterior border of PRP laser -- regressing with interval improvement in leakage s/p PRP fill in OS: focal patches of NVE at temporal posterior border of PRP laser --  mild interval improvement in leakage; peripheral vascular perfusion defects with good fill in laser            ASSESSMENT/PLAN:    ICD-10-CM   1. 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)  E11.3513 OCT, Retina - OU - Both Eyes    2. Essential hypertension  I10     3. Hypertensive retinopathy of both eyes  H35.033 Fluorescein Angiography Optos (Transit OS)    4. Combined forms of age-related cataract of both eyes  H25.813      Proliferative diabetic retinopathy w/o  DME, OU (OS > OD)  - h/o PRP OU in [place], [place] and [place], [place] - referred here by [facility] -- pt has not been seen by Retina specialist since early 2000s - exam shows focal NVE along temporal  posterior border of PRP OU (OS > OD) w/ +IRH/DBH - FA (04.03.24) shows OD: 3 focal patches of NVE at temporal posterior border of PRP laser -- +leakage; OS: focal patches of NVE at temporal posterior border of PRP laser, peripheral vascular perfusion defects with inadequate laser -- pt would benefit from fill in PRP OU - s/p PRP OS (04.03.24) - s/p PRP OD (04.09.24) - repeat FA (07.02.24) showsOD: 3 focal patches of NVE at temporal posterior border of PRP laser -- regressing with interval improvement in leakage s/p PRP fill in; OS: focal patches of NVE at temporal posterior border of PRP laser --  mild interval improvement in leakage; peripheral vascular perfusion defects with good fill in laser - BCVA 20/20 OU -- stable - OCT shows tr cystic changes / diabetic macular edema, both eyes  - f/u in 4-6 mos for DFE, OCT, FA (transit OS)  2,3. Hypertensive retinopathy OU - discussed importance of tight BP control - monitor  4. Mixed Cataract OU - The symptoms of cataract, surgical options, and treatments and risks were discussed with patient. - discussed diagnosis and progression - monitor   Ophthalmic Meds Ordered this visit:  No orders of the defined types were placed in this encounter.    Return for f/u 4-6 months, PDR OU, DFE, OCT, FA (transit OS).  There are no Patient Instructions on file for this visit.   Explained the diagnoses, plan, and follow up with the patient and they expressed understanding.  Patient expressed understanding of the importance of proper follow up care.   This document serves as a record of services personally performed by Karie Chimera, MD, PhD. It was created on their behalf by Gerilyn Nestle, COT an ophthalmic technician. The creation of this record is the provider's dictation and/or activities during the visit.    Electronically signed by:  Charlette Caffey, COT  05/09/23 5:34 PM   This document serves as a record of services personally performed  by Karie Chimera, MD, PhD. It was created on their behalf by Glee Arvin. Manson Passey, OA an ophthalmic technician. The creation of this record is the provider's dictation and/or activities during the visit.    Electronically signed by: Glee Arvin. Manson Passey, OA 05/09/23 5:34 PM  Karie Chimera, M.D., Ph.D. Diseases & Surgery of the Retina and Vitreous [facility]  I have reviewed the above documentation for accuracy and completeness, and I agree with the above. Karie Chimera, M.D., Ph.D. 05/09/23 5:35 PM   Abbreviations: M myopia (nearsighted); A astigmatism; H hyperopia (farsighted); P presbyopia; Mrx spectacle prescription;  CTL contact lenses; OD right eye; OS left eye; OU both eyes  XT exotropia; ET esotropia; PEK punctate epithelial keratitis; PEE punctate epithelial erosions; DES dry eye syndrome; MGD meibomian gland dysfunction; ATs artificial tears; PFAT's preservative free artificial tears; NSC nuclear sclerotic cataract; PSC posterior subcapsular cataract; ERM epi-retinal membrane; PVD posterior vitreous detachment; RD retinal detachment; DM diabetes mellitus; DR diabetic retinopathy; NPDR non-proliferative diabetic retinopathy; PDR proliferative diabetic retinopathy; CSME clinically significant macular edema; DME diabetic macular edema; dbh dot blot hemorrhages; CWS cotton wool spot; POAG primary open angle glaucoma; C/D cup-to-disc ratio; HVF humphrey visual field; GVF goldmann visual field; OCT optical coherence tomography;  IOP intraocular pressure; BRVO Branch retinal vein occlusion; CRVO central retinal vein occlusion; CRAO central retinal artery occlusion; BRAO branch retinal artery occlusion; RT retinal tear; SB scleral buckle; PPV pars plana vitrectomy; VH Vitreous hemorrhage; PRP panretinal laser photocoagulation; IVK intravitreal kenalog; VMT vitreomacular traction; MH Macular hole;  NVD neovascularization of the disc; NVE neovascularization elsewhere; AREDS age related  eye disease study; ARMD age related macular degeneration; POAG primary open angle glaucoma; EBMD epithelial/anterior basement membrane dystrophy; ACIOL anterior chamber intraocular lens; IOL intraocular lens; PCIOL posterior chamber intraocular lens; Phaco/IOL phacoemulsification with intraocular lens placement; PRK photorefractive keratectomy; LASIK laser assisted in situ keratomileusis; HTN hypertension; DM diabetes mellitus; COPD chronic obstructive pulmonary disease

## 2023-05-09 ENCOUNTER — Encounter (INDEPENDENT_AMBULATORY_CARE_PROVIDER_SITE_OTHER): Payer: Self-pay | Admitting: Ophthalmology

## 2023-05-09 ENCOUNTER — Ambulatory Visit (INDEPENDENT_AMBULATORY_CARE_PROVIDER_SITE_OTHER): Payer: Commercial Managed Care - PPO | Admitting: Ophthalmology

## 2023-05-09 DIAGNOSIS — I10 Essential (primary) hypertension: Secondary | ICD-10-CM | POA: Diagnosis not present

## 2023-05-09 DIAGNOSIS — Z9483 Pancreas transplant status: Secondary | ICD-10-CM | POA: Diagnosis not present

## 2023-05-09 DIAGNOSIS — H35033 Hypertensive retinopathy, bilateral: Secondary | ICD-10-CM | POA: Diagnosis not present

## 2023-05-09 DIAGNOSIS — E113513 Type 2 diabetes mellitus with proliferative diabetic retinopathy with macular edema, bilateral: Secondary | ICD-10-CM | POA: Diagnosis not present

## 2023-05-09 DIAGNOSIS — H25813 Combined forms of age-related cataract, bilateral: Secondary | ICD-10-CM | POA: Diagnosis not present

## 2023-05-09 DIAGNOSIS — Z940 Kidney transplant status: Secondary | ICD-10-CM | POA: Diagnosis not present

## 2023-05-15 ENCOUNTER — Encounter (INDEPENDENT_AMBULATORY_CARE_PROVIDER_SITE_OTHER): Payer: Medicare Other | Admitting: Ophthalmology

## 2023-05-15 DIAGNOSIS — Z940 Kidney transplant status: Secondary | ICD-10-CM | POA: Diagnosis not present

## 2023-05-23 DIAGNOSIS — Z940 Kidney transplant status: Secondary | ICD-10-CM | POA: Diagnosis not present

## 2023-05-31 DIAGNOSIS — M79674 Pain in right toe(s): Secondary | ICD-10-CM | POA: Diagnosis not present

## 2023-06-09 DIAGNOSIS — H6993 Unspecified Eustachian tube disorder, bilateral: Secondary | ICD-10-CM | POA: Diagnosis not present

## 2023-06-09 DIAGNOSIS — H6593 Unspecified nonsuppurative otitis media, bilateral: Secondary | ICD-10-CM | POA: Diagnosis not present

## 2023-06-12 DIAGNOSIS — Z940 Kidney transplant status: Secondary | ICD-10-CM | POA: Diagnosis not present

## 2023-06-19 ENCOUNTER — Other Ambulatory Visit (HOSPITAL_BASED_OUTPATIENT_CLINIC_OR_DEPARTMENT_OTHER): Payer: Self-pay

## 2023-06-19 MED ORDER — SULFAMETHOXAZOLE-TRIMETHOPRIM 400-80 MG PO TABS
ORAL_TABLET | ORAL | 3 refills | Status: DC
  Filled 2023-06-26: qty 39, 84d supply, fill #0
  Filled 2023-06-26 – 2023-07-17 (×2): qty 39, 90d supply, fill #0
  Filled 2023-10-02: qty 39, 90d supply, fill #1
  Filled 2024-01-02: qty 39, 90d supply, fill #0

## 2023-06-19 MED ORDER — PREDNISONE 5 MG PO TABS
5.0000 mg | ORAL_TABLET | Freq: Every day | ORAL | 3 refills | Status: AC
  Filled 2023-06-19 – 2023-07-06 (×5): qty 90, 90d supply, fill #0
  Filled 2023-10-02: qty 25, 25d supply, fill #1
  Filled 2023-10-02: qty 65, 65d supply, fill #1
  Filled 2024-01-02: qty 90, 90d supply, fill #0
  Filled 2024-02-02: qty 90, 90d supply, fill #1

## 2023-06-19 MED ORDER — MYCOPHENOLATE SODIUM 180 MG PO TBEC
DELAYED_RELEASE_TABLET | ORAL | 2 refills | Status: DC
  Filled 2023-06-19 – 2023-07-17 (×6): qty 150, 30d supply, fill #0
  Filled 2023-08-14 – 2023-08-15 (×2): qty 150, 30d supply, fill #1
  Filled 2023-09-19: qty 150, 30d supply, fill #2

## 2023-06-20 ENCOUNTER — Other Ambulatory Visit (HOSPITAL_BASED_OUTPATIENT_CLINIC_OR_DEPARTMENT_OTHER): Payer: Self-pay

## 2023-06-21 ENCOUNTER — Other Ambulatory Visit (HOSPITAL_BASED_OUTPATIENT_CLINIC_OR_DEPARTMENT_OTHER): Payer: Self-pay

## 2023-06-21 MED ORDER — ROSUVASTATIN CALCIUM 5 MG PO TABS
ORAL_TABLET | ORAL | 3 refills | Status: DC
  Filled 2023-06-21: qty 36, 84d supply, fill #0
  Filled 2023-06-26: qty 36, 84d supply, fill #1

## 2023-06-22 ENCOUNTER — Other Ambulatory Visit (HOSPITAL_BASED_OUTPATIENT_CLINIC_OR_DEPARTMENT_OTHER): Payer: Self-pay

## 2023-06-22 DIAGNOSIS — Z940 Kidney transplant status: Secondary | ICD-10-CM | POA: Diagnosis not present

## 2023-06-23 ENCOUNTER — Other Ambulatory Visit (HOSPITAL_COMMUNITY): Payer: Self-pay

## 2023-06-23 MED ORDER — TACROLIMUS 1 MG PO CAPS
ORAL_CAPSULE | ORAL | 6 refills | Status: DC
  Filled 2023-06-23 – 2023-07-17 (×3): qty 150, 30d supply, fill #0
  Filled 2023-08-14: qty 150, 30d supply, fill #1
  Filled 2023-09-05 – 2023-12-21 (×2): qty 150, 30d supply, fill #2
  Filled 2024-02-02: qty 150, 30d supply, fill #3
  Filled 2024-02-05: qty 450, 90d supply, fill #3

## 2023-06-26 ENCOUNTER — Other Ambulatory Visit (HOSPITAL_BASED_OUTPATIENT_CLINIC_OR_DEPARTMENT_OTHER): Payer: Self-pay

## 2023-06-26 ENCOUNTER — Other Ambulatory Visit (HOSPITAL_COMMUNITY): Payer: Self-pay

## 2023-06-26 ENCOUNTER — Other Ambulatory Visit: Payer: Self-pay | Admitting: Orthopaedic Surgery

## 2023-06-27 ENCOUNTER — Encounter (HOSPITAL_BASED_OUTPATIENT_CLINIC_OR_DEPARTMENT_OTHER): Payer: Self-pay

## 2023-06-27 ENCOUNTER — Other Ambulatory Visit: Payer: Self-pay

## 2023-06-27 ENCOUNTER — Other Ambulatory Visit (HOSPITAL_BASED_OUTPATIENT_CLINIC_OR_DEPARTMENT_OTHER): Payer: Self-pay

## 2023-06-27 MED ORDER — AMLODIPINE BESYLATE 10 MG PO TABS
10.0000 mg | ORAL_TABLET | Freq: Every day | ORAL | 3 refills | Status: AC
  Filled 2023-06-27: qty 90, 90d supply, fill #0
  Filled 2023-09-19: qty 90, 90d supply, fill #1
  Filled 2023-12-21 – 2023-12-22 (×2): qty 90, 90d supply, fill #0
  Filled 2024-02-02: qty 90, 90d supply, fill #1

## 2023-06-29 ENCOUNTER — Other Ambulatory Visit (HOSPITAL_BASED_OUTPATIENT_CLINIC_OR_DEPARTMENT_OTHER): Payer: Self-pay

## 2023-06-29 DIAGNOSIS — J029 Acute pharyngitis, unspecified: Secondary | ICD-10-CM | POA: Diagnosis not present

## 2023-06-29 DIAGNOSIS — Z1152 Encounter for screening for COVID-19: Secondary | ICD-10-CM | POA: Diagnosis not present

## 2023-06-29 DIAGNOSIS — B349 Viral infection, unspecified: Secondary | ICD-10-CM | POA: Diagnosis not present

## 2023-06-29 DIAGNOSIS — H9202 Otalgia, left ear: Secondary | ICD-10-CM | POA: Diagnosis not present

## 2023-06-29 DIAGNOSIS — Z9483 Pancreas transplant status: Secondary | ICD-10-CM | POA: Diagnosis not present

## 2023-06-29 DIAGNOSIS — I10 Essential (primary) hypertension: Secondary | ICD-10-CM | POA: Diagnosis not present

## 2023-06-29 DIAGNOSIS — R0981 Nasal congestion: Secondary | ICD-10-CM | POA: Diagnosis not present

## 2023-06-29 MED ORDER — CARVEDILOL 12.5 MG PO TABS
12.5000 mg | ORAL_TABLET | Freq: Two times a day (BID) | ORAL | 3 refills | Status: AC
  Filled 2023-06-29: qty 180, 90d supply, fill #0
  Filled 2023-09-19: qty 180, 90d supply, fill #1
  Filled 2023-12-21: qty 180, 90d supply, fill #0
  Filled 2024-02-02: qty 180, 90d supply, fill #1

## 2023-07-05 DIAGNOSIS — Z940 Kidney transplant status: Secondary | ICD-10-CM | POA: Diagnosis not present

## 2023-07-05 DIAGNOSIS — I129 Hypertensive chronic kidney disease with stage 1 through stage 4 chronic kidney disease, or unspecified chronic kidney disease: Secondary | ICD-10-CM | POA: Diagnosis not present

## 2023-07-05 DIAGNOSIS — Z9483 Pancreas transplant status: Secondary | ICD-10-CM | POA: Diagnosis not present

## 2023-07-05 DIAGNOSIS — I739 Peripheral vascular disease, unspecified: Secondary | ICD-10-CM | POA: Diagnosis not present

## 2023-07-05 DIAGNOSIS — B348 Other viral infections of unspecified site: Secondary | ICD-10-CM | POA: Diagnosis not present

## 2023-07-06 ENCOUNTER — Other Ambulatory Visit (HOSPITAL_BASED_OUTPATIENT_CLINIC_OR_DEPARTMENT_OTHER): Payer: Self-pay

## 2023-07-17 ENCOUNTER — Other Ambulatory Visit (HOSPITAL_BASED_OUTPATIENT_CLINIC_OR_DEPARTMENT_OTHER): Payer: Self-pay

## 2023-07-20 DIAGNOSIS — Z940 Kidney transplant status: Secondary | ICD-10-CM | POA: Diagnosis not present

## 2023-07-20 DIAGNOSIS — Z9483 Pancreas transplant status: Secondary | ICD-10-CM | POA: Diagnosis not present

## 2023-08-07 ENCOUNTER — Other Ambulatory Visit (HOSPITAL_BASED_OUTPATIENT_CLINIC_OR_DEPARTMENT_OTHER): Payer: Self-pay

## 2023-08-07 DIAGNOSIS — Z1152 Encounter for screening for COVID-19: Secondary | ICD-10-CM | POA: Diagnosis not present

## 2023-08-07 DIAGNOSIS — R0981 Nasal congestion: Secondary | ICD-10-CM | POA: Diagnosis not present

## 2023-08-07 DIAGNOSIS — D84821 Immunodeficiency due to drugs: Secondary | ICD-10-CM | POA: Diagnosis not present

## 2023-08-07 DIAGNOSIS — I10 Essential (primary) hypertension: Secondary | ICD-10-CM | POA: Diagnosis not present

## 2023-08-07 DIAGNOSIS — J069 Acute upper respiratory infection, unspecified: Secondary | ICD-10-CM | POA: Diagnosis not present

## 2023-08-07 DIAGNOSIS — R058 Other specified cough: Secondary | ICD-10-CM | POA: Diagnosis not present

## 2023-08-07 DIAGNOSIS — R5383 Other fatigue: Secondary | ICD-10-CM | POA: Diagnosis not present

## 2023-08-07 DIAGNOSIS — Z9483 Pancreas transplant status: Secondary | ICD-10-CM | POA: Diagnosis not present

## 2023-08-07 MED ORDER — AMOXICILLIN-POT CLAVULANATE 875-125 MG PO TABS
1.0000 | ORAL_TABLET | Freq: Two times a day (BID) | ORAL | 0 refills | Status: DC
  Filled 2023-08-07: qty 20, 10d supply, fill #0

## 2023-08-09 ENCOUNTER — Other Ambulatory Visit (HOSPITAL_COMMUNITY): Payer: Self-pay

## 2023-08-09 MED ORDER — TACROLIMUS 1 MG PO CAPS
3.0000 mg | ORAL_CAPSULE | Freq: Two times a day (BID) | ORAL | 3 refills | Status: DC
  Filled 2023-08-09: qty 180, 60d supply, fill #0
  Filled 2023-09-05 – 2023-09-08 (×2): qty 180, 30d supply, fill #0
  Filled 2023-10-02: qty 180, 30d supply, fill #1
  Filled 2023-11-02: qty 180, 30d supply, fill #2
  Filled 2023-11-27: qty 180, 30d supply, fill #3

## 2023-08-11 ENCOUNTER — Other Ambulatory Visit (HOSPITAL_COMMUNITY): Payer: Self-pay

## 2023-08-14 ENCOUNTER — Other Ambulatory Visit (HOSPITAL_BASED_OUTPATIENT_CLINIC_OR_DEPARTMENT_OTHER): Payer: Self-pay

## 2023-08-15 ENCOUNTER — Other Ambulatory Visit (HOSPITAL_BASED_OUTPATIENT_CLINIC_OR_DEPARTMENT_OTHER): Payer: Self-pay

## 2023-08-17 DIAGNOSIS — Z9483 Pancreas transplant status: Secondary | ICD-10-CM | POA: Diagnosis not present

## 2023-08-17 DIAGNOSIS — Z940 Kidney transplant status: Secondary | ICD-10-CM | POA: Diagnosis not present

## 2023-08-30 ENCOUNTER — Ambulatory Visit (INDEPENDENT_AMBULATORY_CARE_PROVIDER_SITE_OTHER): Payer: Commercial Managed Care - PPO | Admitting: Podiatry

## 2023-08-30 ENCOUNTER — Encounter: Payer: Self-pay | Admitting: Podiatry

## 2023-08-30 DIAGNOSIS — M722 Plantar fascial fibromatosis: Secondary | ICD-10-CM | POA: Diagnosis not present

## 2023-08-30 MED ORDER — TRIAMCINOLONE ACETONIDE 10 MG/ML IJ SUSP
10.0000 mg | Freq: Once | INTRAMUSCULAR | Status: AC
  Administered 2023-08-30: 10 mg via INTRA_ARTICULAR

## 2023-08-31 NOTE — Progress Notes (Signed)
Note Subjective:   Patient ID: Gregory Allen, male   DOB: 52 y.o.   MRN: [id number]   HPI Patient presents stating that he has pain in both heels that have formed recently and it has been a long time   ROS      Objective:  Physical Exam  Neurovascular status intact with inflammation plantar heel region right over left of several months duration with patient trying to get more active     Assessment:  Acute plantar fasciitis bilateral     Plan:  Reviewed condition sterile prep injected the plantar fascia 3 mg dexamethasone Kenalog 5 mg Xylocaine advised on support reappoint to recheck

## 2023-09-05 ENCOUNTER — Other Ambulatory Visit (HOSPITAL_BASED_OUTPATIENT_CLINIC_OR_DEPARTMENT_OTHER): Payer: Self-pay

## 2023-09-05 ENCOUNTER — Other Ambulatory Visit (HOSPITAL_COMMUNITY): Payer: Self-pay

## 2023-09-08 ENCOUNTER — Other Ambulatory Visit (HOSPITAL_BASED_OUTPATIENT_CLINIC_OR_DEPARTMENT_OTHER): Payer: Self-pay

## 2023-09-11 ENCOUNTER — Other Ambulatory Visit (HOSPITAL_BASED_OUTPATIENT_CLINIC_OR_DEPARTMENT_OTHER): Payer: Self-pay

## 2023-09-15 DIAGNOSIS — Z0000 Encounter for general adult medical examination without abnormal findings: Secondary | ICD-10-CM | POA: Diagnosis not present

## 2023-09-15 DIAGNOSIS — E1169 Type 2 diabetes mellitus with other specified complication: Secondary | ICD-10-CM | POA: Diagnosis not present

## 2023-09-15 DIAGNOSIS — I10 Essential (primary) hypertension: Secondary | ICD-10-CM | POA: Diagnosis not present

## 2023-09-15 DIAGNOSIS — Z0189 Encounter for other specified special examinations: Secondary | ICD-10-CM | POA: Diagnosis not present

## 2023-09-15 DIAGNOSIS — E785 Hyperlipidemia, unspecified: Secondary | ICD-10-CM | POA: Diagnosis not present

## 2023-09-18 ENCOUNTER — Other Ambulatory Visit (HOSPITAL_BASED_OUTPATIENT_CLINIC_OR_DEPARTMENT_OTHER): Payer: Self-pay

## 2023-09-25 DIAGNOSIS — I10 Essential (primary) hypertension: Secondary | ICD-10-CM | POA: Diagnosis not present

## 2023-09-26 NOTE — Progress Notes (Shared)
[facility] - Clinic Note  10/10/2023     CHIEF COMPLAINT Patient presents for No chief complaint on file.   HISTORY OF PRESENT ILLNESS: Gregory Allen is a 52 y.o. male who presents to the clinic today for:    Pt states vision is the same  Referring physician: Alysia Penna, MD [address] [place],  [place] [address]  HISTORICAL INFORMATION:   Selected notes from the MEDICAL RECORD NUMBER Referred by [facility] for diabetic retinopathy eval LEE:  Ocular Hx- history of PRP OU in [place] and [place], [place] PMH-    CURRENT MEDICATIONS: No current outpatient medications on file. (Ophthalmic Drugs)   No current facility-administered medications for this visit. (Ophthalmic Drugs)   Current Outpatient Medications (Other)  Medication Sig   acetaminophen (TYLENOL) 500 MG tablet Take 1,000 mg by mouth every 6 (six) hours as needed for moderate pain or headache.   amLODipine (NORVASC) 10 MG tablet Take 10 mg by mouth daily.    amLODipine (NORVASC) 10 MG tablet Take 1 tablet (10 mg total) by mouth daily.   amoxicillin-clavulanate (AUGMENTIN) 875-125 MG tablet Take 1 tablet by mouth 2 (two) times daily with food for 10 days.   aspirin EC 81 MG tablet Take 81 mg by mouth daily.    Capsaicin 0.1 % CREA Apply 1 application topically daily as needed (pain).   carvedilol (COREG) 12.5 MG tablet Take 12.5 mg by mouth 2 (two) times daily with a meal.   carvedilol (COREG) 12.5 MG tablet Take 1 tablet (12.5 mg total) by mouth 2 (two) times daily with food.   DULoxetine (CYMBALTA) 30 MG capsule TAKE 2 CAPSULES BY MOUTH AFTER BREAKFAST AND 1 CAPSULE EVERY EVENING   gabapentin (NEURONTIN) 300 MG capsule Take 600 mg by mouth 2 (two) times daily.   HYDROcodone-acetaminophen (NORCO/VICODIN) 5-325 MG tablet Take 1-2 tablets by mouth every 6 (six) hours as needed.   Magnesium 400 MG TABS Take 400 mg by mouth daily.   mycophenolate (MYFORTIC) 180 MG EC tablet Take 3 tablets  (540 mg total) by mouth every morning AND 2 tablets (360 mg total) every evening.   pentoxifylline (TRENTAL) 400 MG CR tablet Take 400 mg by mouth 2 (two) times daily.   predniSONE (DELTASONE) 5 MG tablet Take 1 tablet by mouth once a day   sulfamethoxazole-trimethoprim (BACTRIM) 400-80 MG tablet TAKE 1 TABLET BY MOUTH EVERY MONDAY, WEDNESDAY, FRIDAY.   sulfamethoxazole-trimethoprim (BACTRIM) 400-80 MG tablet Take 1 tablet by mouth every Monday, Wednesday and Friday.   tacrolimus (PROGRAF) 1 MG capsule Take 5 mg by mouth daily. 3 capsules in the am, 2 at bedtime.   tacrolimus (PROGRAF) 1 MG capsule Take 3 capsules (3 mg total) by mouth in the morning AND 2 capsules (2 mg total) every evening.   tacrolimus (PROGRAF) 1 MG capsule Take 3 capsules (3 mg total) by mouth 2 (two) times daily.   No current facility-administered medications for this visit. (Other)   REVIEW OF SYSTEMS:     ALLERGIES Allergies  Allergen Reactions   Shellfish-Derived Products Itching    "throat starts itching" pt states "he is ok with iodine"   Ivp Dye [Iodinated Contrast Media]     Has had kidney transplant   PAST MEDICAL HISTORY Past Medical History:  Diagnosis Date   Arthritis    left knee   Diabetes mellitus without complication (HCC)    no meds since pancreatic transplant 2018   Hypertension    Kidney transplant recipient  2018   PAD (peripheral artery disease) (HCC)    Pancreas transplant status (HCC) 2018   Peripheral neuropathy    Past Surgical History:  Procedure Laterality Date   ABDOMINAL AORTOGRAM W/LOWER EXTREMITY Bilateral 06/12/2020   Procedure: ABDOMINAL AORTOGRAM W/LOWER EXTREMITY;  Surgeon: Sherren Kerns, MD;  Location: [facility];  Service: Cardiovascular;  Laterality: Bilateral;  Bilateral    ANKLE SURGERY Right    BACK SURGERY     FOOT SURGERY Left    HAND SURGERY Right    thumb   KIDNEY TRANSPLANT     TRANSPLANT PANCREATIC ALLOGRAFT     ULNAR TUNNEL RELEASE Right  10/28/2020   Procedure: RIGHT CUBITAL TUNNEL RELEASE, TRANSPOSITION;  Surgeon: Tarry Kos, MD;  Location: [facility];  Service: Orthopedics;  Laterality: Right;   FAMILY HISTORY Family History  Problem Relation Age of Onset   Diabetes Mother    Hypertension Mother    Diabetes Father    Hypertension Father    Colon cancer Neg Hx    Colon polyps Neg Hx    SOCIAL HISTORY Social History   Tobacco Use   Smoking status: Never   Smokeless tobacco: Never  Vaping Use   Vaping status: Never Used  Substance Use Topics   Alcohol use: No    Alcohol/week: 0.0 standard drinks of alcohol   Drug use: No       OPHTHALMIC EXAM:  Not recorded     IMAGING AND PROCEDURES  Imaging and Procedures for 10/10/2023          ASSESSMENT/PLAN:    ICD-10-CM   1. 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)  W09.8119     2. Essential hypertension  I10     3. Hypertensive retinopathy of both eyes  H35.033     4. Combined forms of age-related cataract of both eyes  H25.813       Proliferative diabetic retinopathy w/o DME, OU (OS > OD)  - h/o PRP OU in [place], [place] and [place], [place] - referred here by [facility] -- pt has not been seen by Retina specialist since early 2000s - exam shows focal NVE along temporal posterior border of PRP OU (OS > OD) w/ +IRH/DBH - FA (04.03.24) shows OD: 3 focal patches of NVE at temporal posterior border of PRP laser -- +leakage; OS: focal patches of NVE at temporal posterior border of PRP laser, peripheral vascular perfusion defects with inadequate laser -- pt would benefit from fill in PRP OU - s/p PRP OS (04.03.24) - s/p PRP OD (04.09.24) - repeat FA (07.02.24) showsOD: 3 focal patches of NVE at temporal posterior border of PRP laser -- regressing with interval improvement in leakage s/p PRP fill in; OS: focal patches of NVE at temporal posterior border of PRP laser --  mild  interval improvement in leakage; peripheral vascular perfusion defects with good fill in laser - BCVA 20/20 OU -- stable - OCT shows tr cystic changes / diabetic macular edema, both eyes  - f/u in 4-6 mos for DFE, OCT, FA (transit OS)  2,3. Hypertensive retinopathy OU - discussed importance of tight BP control - monitor  4. Mixed Cataract OU - The symptoms of cataract, surgical options, and treatments and risks were discussed with patient. - discussed diagnosis and progression - monitor   Ophthalmic Meds Ordered this visit:  No orders of the defined types were placed in this encounter.  No follow-ups on file.  There are no Patient Instructions on file for this visit.   Explained the diagnoses, plan, and follow up with the patient and they expressed understanding.  Patient expressed understanding of the importance of proper follow up care.   This document serves as a record of services personally performed by Karie Chimera, MD, PhD. It was created on their behalf by Charlette Caffey, COT an ophthalmic technician. The creation of this record is the provider's dictation and/or activities during the visit.    Electronically signed by:  Charlette Caffey, COT  09/26/23 12:18 PM   Karie Chimera, M.D., Ph.D. Diseases & Surgery of the Retina and Vitreous [facility]   Abbreviations: M myopia (nearsighted); A astigmatism; H hyperopia (farsighted); P presbyopia; Mrx spectacle prescription;  CTL contact lenses; OD right eye; OS left eye; OU both eyes  XT exotropia; ET esotropia; PEK punctate epithelial keratitis; PEE punctate epithelial erosions; DES dry eye syndrome; MGD meibomian gland dysfunction; ATs artificial tears; PFAT's preservative free artificial tears; NSC nuclear sclerotic cataract; PSC posterior subcapsular cataract; ERM epi-retinal membrane; PVD posterior vitreous detachment; RD retinal detachment; DM diabetes mellitus; DR diabetic  retinopathy; NPDR non-proliferative diabetic retinopathy; PDR proliferative diabetic retinopathy; CSME clinically significant macular edema; DME diabetic macular edema; dbh dot blot hemorrhages; CWS cotton wool spot; POAG primary open angle glaucoma; C/D cup-to-disc ratio; HVF humphrey visual field; GVF goldmann visual field; OCT optical coherence tomography; IOP intraocular pressure; BRVO Branch retinal vein occlusion; CRVO central retinal vein occlusion; CRAO central retinal artery occlusion; BRAO branch retinal artery occlusion; RT retinal tear; SB scleral buckle; PPV pars plana vitrectomy; VH Vitreous hemorrhage; PRP panretinal laser photocoagulation; IVK intravitreal kenalog; VMT vitreomacular traction; MH Macular hole;  NVD neovascularization of the disc; NVE neovascularization elsewhere; AREDS age related eye disease study; ARMD age related macular degeneration; POAG primary open angle glaucoma; EBMD epithelial/anterior basement membrane dystrophy; ACIOL anterior chamber intraocular lens; IOL intraocular lens; PCIOL posterior chamber intraocular lens; Phaco/IOL phacoemulsification with intraocular lens placement; PRK photorefractive keratectomy; LASIK laser assisted in situ keratomileusis; HTN hypertension; DM diabetes mellitus; COPD chronic obstructive pulmonary disease

## 2023-10-02 ENCOUNTER — Other Ambulatory Visit (HOSPITAL_BASED_OUTPATIENT_CLINIC_OR_DEPARTMENT_OTHER): Payer: Self-pay

## 2023-10-10 ENCOUNTER — Encounter (INDEPENDENT_AMBULATORY_CARE_PROVIDER_SITE_OTHER): Payer: Medicare Other | Admitting: Ophthalmology

## 2023-10-10 DIAGNOSIS — E113513 Type 2 diabetes mellitus with proliferative diabetic retinopathy with macular edema, bilateral: Secondary | ICD-10-CM

## 2023-10-10 DIAGNOSIS — H25813 Combined forms of age-related cataract, bilateral: Secondary | ICD-10-CM

## 2023-10-10 DIAGNOSIS — Z9483 Pancreas transplant status: Secondary | ICD-10-CM | POA: Diagnosis not present

## 2023-10-10 DIAGNOSIS — H35033 Hypertensive retinopathy, bilateral: Secondary | ICD-10-CM

## 2023-10-10 DIAGNOSIS — Z940 Kidney transplant status: Secondary | ICD-10-CM | POA: Diagnosis not present

## 2023-10-10 DIAGNOSIS — I10 Essential (primary) hypertension: Secondary | ICD-10-CM

## 2023-10-10 DIAGNOSIS — B348 Other viral infections of unspecified site: Secondary | ICD-10-CM | POA: Diagnosis not present

## 2023-10-16 ENCOUNTER — Other Ambulatory Visit (HOSPITAL_BASED_OUTPATIENT_CLINIC_OR_DEPARTMENT_OTHER): Payer: Self-pay

## 2023-10-16 DIAGNOSIS — E1140 Type 2 diabetes mellitus with diabetic neuropathy, unspecified: Secondary | ICD-10-CM | POA: Diagnosis not present

## 2023-10-16 MED ORDER — PREGABALIN 100 MG PO CAPS
100.0000 mg | ORAL_CAPSULE | Freq: Three times a day (TID) | ORAL | 2 refills | Status: DC
  Filled 2023-10-16: qty 90, 30d supply, fill #0

## 2023-10-23 ENCOUNTER — Other Ambulatory Visit (HOSPITAL_BASED_OUTPATIENT_CLINIC_OR_DEPARTMENT_OTHER): Payer: Self-pay

## 2023-10-31 ENCOUNTER — Other Ambulatory Visit (HOSPITAL_BASED_OUTPATIENT_CLINIC_OR_DEPARTMENT_OTHER): Payer: Self-pay

## 2023-10-31 MED ORDER — MYCOPHENOLATE SODIUM 180 MG PO TBEC
DELAYED_RELEASE_TABLET | ORAL | 2 refills | Status: DC
  Filled 2023-10-31: qty 150, 30d supply, fill #0
  Filled 2023-11-27: qty 150, 30d supply, fill #1
  Filled 2023-12-25: qty 150, 30d supply, fill #0

## 2023-11-22 DIAGNOSIS — Z940 Kidney transplant status: Secondary | ICD-10-CM | POA: Diagnosis not present

## 2023-11-22 DIAGNOSIS — Z9483 Pancreas transplant status: Secondary | ICD-10-CM | POA: Diagnosis not present

## 2023-11-28 ENCOUNTER — Other Ambulatory Visit: Payer: Self-pay

## 2023-12-21 ENCOUNTER — Other Ambulatory Visit: Payer: Self-pay

## 2023-12-21 ENCOUNTER — Other Ambulatory Visit (HOSPITAL_COMMUNITY): Payer: Self-pay

## 2023-12-22 ENCOUNTER — Other Ambulatory Visit (HOSPITAL_COMMUNITY): Payer: Self-pay

## 2023-12-22 ENCOUNTER — Other Ambulatory Visit (HOSPITAL_BASED_OUTPATIENT_CLINIC_OR_DEPARTMENT_OTHER): Payer: Self-pay

## 2023-12-25 ENCOUNTER — Other Ambulatory Visit: Payer: Self-pay

## 2024-01-02 ENCOUNTER — Other Ambulatory Visit (HOSPITAL_COMMUNITY): Payer: Self-pay

## 2024-01-03 ENCOUNTER — Other Ambulatory Visit (HOSPITAL_COMMUNITY): Payer: Self-pay

## 2024-01-15 ENCOUNTER — Other Ambulatory Visit (HOSPITAL_COMMUNITY): Payer: Self-pay

## 2024-01-15 DIAGNOSIS — Z9483 Pancreas transplant status: Secondary | ICD-10-CM | POA: Diagnosis not present

## 2024-01-15 DIAGNOSIS — Z940 Kidney transplant status: Secondary | ICD-10-CM | POA: Diagnosis not present

## 2024-01-15 MED ORDER — MYCOPHENOLATE SODIUM 180 MG PO TBEC
DELAYED_RELEASE_TABLET | ORAL | 2 refills | Status: AC
  Filled 2024-01-15 – 2024-01-19 (×2): qty 150, 30d supply, fill #0
  Filled 2024-02-02 – 2024-02-25 (×2): qty 150, 30d supply, fill #1

## 2024-01-19 ENCOUNTER — Other Ambulatory Visit (HOSPITAL_COMMUNITY): Payer: Self-pay

## 2024-02-02 ENCOUNTER — Other Ambulatory Visit (HOSPITAL_COMMUNITY): Payer: Self-pay

## 2024-02-02 ENCOUNTER — Other Ambulatory Visit: Payer: Self-pay

## 2024-02-05 ENCOUNTER — Other Ambulatory Visit: Payer: Self-pay

## 2024-02-05 ENCOUNTER — Other Ambulatory Visit (HOSPITAL_COMMUNITY): Payer: Self-pay

## 2024-02-06 ENCOUNTER — Other Ambulatory Visit (HOSPITAL_COMMUNITY): Payer: Self-pay

## 2024-02-06 MED ORDER — TACROLIMUS 1 MG PO CAPS
ORAL_CAPSULE | ORAL | 5 refills | Status: AC
  Filled 2024-02-06: qty 150, 30d supply, fill #0

## 2024-02-07 ENCOUNTER — Other Ambulatory Visit (HOSPITAL_COMMUNITY): Payer: Self-pay

## 2024-02-21 ENCOUNTER — Other Ambulatory Visit (HOSPITAL_COMMUNITY): Payer: Self-pay

## 2024-02-25 ENCOUNTER — Other Ambulatory Visit (HOSPITAL_COMMUNITY): Payer: Self-pay

## 2024-02-26 ENCOUNTER — Other Ambulatory Visit (HOSPITAL_COMMUNITY): Payer: Self-pay

## 2024-02-26 DIAGNOSIS — Z940 Kidney transplant status: Secondary | ICD-10-CM | POA: Diagnosis not present

## 2024-02-26 DIAGNOSIS — Z9483 Pancreas transplant status: Secondary | ICD-10-CM | POA: Diagnosis not present

## 2024-02-26 MED ORDER — SULFAMETHOXAZOLE-TRIMETHOPRIM 400-80 MG PO TABS
1.0000 | ORAL_TABLET | ORAL | 3 refills | Status: AC
Start: 2024-02-26 — End: ?
  Filled 2024-02-26: qty 39, 91d supply, fill #0

## 2024-03-07 DIAGNOSIS — Z940 Kidney transplant status: Secondary | ICD-10-CM | POA: Diagnosis not present

## 2024-03-07 DIAGNOSIS — Z9483 Pancreas transplant status: Secondary | ICD-10-CM | POA: Diagnosis not present

## 2024-03-18 ENCOUNTER — Other Ambulatory Visit (HOSPITAL_COMMUNITY): Payer: Self-pay

## 2024-04-10 DIAGNOSIS — Z940 Kidney transplant status: Secondary | ICD-10-CM | POA: Diagnosis not present

## 2024-04-10 DIAGNOSIS — Z9483 Pancreas transplant status: Secondary | ICD-10-CM | POA: Diagnosis not present

## 2024-04-17 ENCOUNTER — Encounter: Payer: Self-pay | Admitting: Podiatry

## 2024-04-17 ENCOUNTER — Ambulatory Visit (INDEPENDENT_AMBULATORY_CARE_PROVIDER_SITE_OTHER): Admitting: Podiatry

## 2024-04-17 DIAGNOSIS — M79674 Pain in right toe(s): Secondary | ICD-10-CM | POA: Diagnosis not present

## 2024-04-17 DIAGNOSIS — Q828 Other specified congenital malformations of skin: Secondary | ICD-10-CM

## 2024-04-17 DIAGNOSIS — M79675 Pain in left toe(s): Secondary | ICD-10-CM

## 2024-04-17 DIAGNOSIS — E1140 Type 2 diabetes mellitus with diabetic neuropathy, unspecified: Secondary | ICD-10-CM

## 2024-04-17 DIAGNOSIS — B351 Tinea unguium: Secondary | ICD-10-CM | POA: Diagnosis not present

## 2024-04-17 DIAGNOSIS — E1149 Type 2 diabetes mellitus with other diabetic neurological complication: Secondary | ICD-10-CM

## 2024-04-18 NOTE — Progress Notes (Signed)
 Subjective:   Patient ID: Gregory Allen, male   DOB: 53 y.o.   MRN: [id number]   HPI Patient presents with painful lesions fifth metatarsal of both feet that he cannot take care of long-term diabetes has had transplant but did have neuropathic orthotics with thick yellow brittle nailbeds 1-5.  The   ROS      Objective:  Physical Exam  Patient is found to have lucent lesions bilateral that are painful when pressed making shoe gear difficult fifth metatarsal both feet and has thick yellow brittle nailbeds 1-5 both feet that he cannot take care of with diminishment of sharp dull and vibratory     Assessment:  Longtime diabetic who finally was able to get transplant with chronic painful keratotic lesions porokeratotic fifth metatarsal bilateral and nail disease with thickness 1-5 both feet     Plan:  Reviewed diabetes continue same treatment plan debrided nailbeds 1-5 both feet debrided lesions fifth metatarsal both feet no iatrogenic bleeding noted

## 2024-04-25 ENCOUNTER — Other Ambulatory Visit: Payer: Self-pay | Admitting: Internal Medicine

## 2024-04-25 DIAGNOSIS — Z940 Kidney transplant status: Secondary | ICD-10-CM | POA: Diagnosis not present

## 2024-04-25 DIAGNOSIS — R109 Unspecified abdominal pain: Secondary | ICD-10-CM

## 2024-04-25 DIAGNOSIS — Z9483 Pancreas transplant status: Secondary | ICD-10-CM | POA: Diagnosis not present

## 2024-04-25 DIAGNOSIS — I129 Hypertensive chronic kidney disease with stage 1 through stage 4 chronic kidney disease, or unspecified chronic kidney disease: Secondary | ICD-10-CM | POA: Diagnosis not present

## 2024-04-25 DIAGNOSIS — R300 Dysuria: Secondary | ICD-10-CM | POA: Diagnosis not present

## 2024-04-25 DIAGNOSIS — B348 Other viral infections of unspecified site: Secondary | ICD-10-CM | POA: Diagnosis not present

## 2024-04-26 ENCOUNTER — Ambulatory Visit
Admission: RE | Admit: 2024-04-26 | Discharge: 2024-04-26 | Disposition: A | Discharge status: Home / Self Care | Source: Ambulatory Visit | Attending: Internal Medicine | Admitting: Internal Medicine

## 2024-04-26 DIAGNOSIS — Z940 Kidney transplant status: Secondary | ICD-10-CM | POA: Diagnosis not present

## 2024-04-26 DIAGNOSIS — R109 Unspecified abdominal pain: Secondary | ICD-10-CM

## 2024-04-29 LAB — LAB REPORT - SCANNED: EGFR: 80

## 2024-05-15 ENCOUNTER — Ambulatory Visit (INDEPENDENT_AMBULATORY_CARE_PROVIDER_SITE_OTHER): Payer: Commercial Managed Care - PPO | Admitting: Family Medicine

## 2024-05-15 ENCOUNTER — Encounter: Payer: Self-pay | Admitting: Family Medicine

## 2024-05-15 VITALS — BP 121/76 | HR 72 | Temp 98.0°F | Ht 70.0 in | Wt 192.6 lb

## 2024-05-15 DIAGNOSIS — K3184 Gastroparesis: Secondary | ICD-10-CM | POA: Diagnosis not present

## 2024-05-15 DIAGNOSIS — Z940 Kidney transplant status: Secondary | ICD-10-CM | POA: Diagnosis not present

## 2024-05-15 DIAGNOSIS — E113513 Type 2 diabetes mellitus with proliferative diabetic retinopathy with macular edema, bilateral: Secondary | ICD-10-CM | POA: Diagnosis not present

## 2024-05-15 DIAGNOSIS — D849 Immunodeficiency, unspecified: Secondary | ICD-10-CM | POA: Diagnosis not present

## 2024-05-15 DIAGNOSIS — I739 Peripheral vascular disease, unspecified: Secondary | ICD-10-CM

## 2024-05-15 DIAGNOSIS — E1143 Type 2 diabetes mellitus with diabetic autonomic (poly)neuropathy: Secondary | ICD-10-CM

## 2024-05-15 DIAGNOSIS — Z8639 Personal history of other endocrine, nutritional and metabolic disease: Secondary | ICD-10-CM

## 2024-05-15 DIAGNOSIS — Z9483 Pancreas transplant status: Secondary | ICD-10-CM | POA: Diagnosis not present

## 2024-05-15 DIAGNOSIS — I10 Essential (primary) hypertension: Secondary | ICD-10-CM

## 2024-05-15 DIAGNOSIS — E1021 Type 1 diabetes mellitus with diabetic nephropathy: Secondary | ICD-10-CM

## 2024-05-15 DIAGNOSIS — G2581 Restless legs syndrome: Secondary | ICD-10-CM | POA: Insufficient documentation

## 2024-05-15 LAB — LIPID PANEL
Cholesterol: 170 mg/dL (ref 0–200)
HDL: 40.6 mg/dL (ref >39.00)
LDL Cholesterol: 109 mg/dL — ABNORMAL HIGH (ref 0–99)
NonHDL: 129.58
Total CHOL/HDL Ratio: 4
Triglycerides: 101 mg/dL (ref 0.0–149.0)
VLDL: 20.2 mg/dL (ref 0.0–40.0)

## 2024-05-15 LAB — TSH: TSH: 0.87 u[IU]/mL (ref 0.35–5.50)

## 2024-05-15 LAB — HEMOGLOBIN A1C: Hgb A1c MFr Bld: 6.3 % (ref 4.6–6.5)

## 2024-05-15 NOTE — Progress Notes (Signed)
 Subjective  CC:  Chief Complaint  Patient presents with   Establish Care    Pt here to establish care   Hypertension   Diabetes    HPI: Gregory Allen is a 53 y.o. male who presents to [facility] today to establish care with me as a new patient.   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  Fortunately he has done well.  He has been off insulin for the last 6 years.  No longer diabetic.  Kidney function has been stable.  He is followed by multiple specialist.  I reviewed multiple notes from his specialist including transplant doctors, surgeon, renal doctor and prior PCP.  He gets monthly labs and I have reviewed those. He has chronic hypertension managed with amlodipine  and carvedilol  12.5 twice daily.  He has no chest pain or shortness of breath.  He did have stress testing done prior to his transplant evaluation and has no heart disease.  He does have atherosclerotic changes on heart studies. His chart shows peripheral artery disease.  He has seen vascular surgery in the past.  He reports he does have blockages but denies claudication.  He is on aspirin therapy only.  I cannot see those records and will request them. Type 1 diabetes, juvenile, onset at age 35.  Complications includes proliferative diabetic retinopathy, severe peripheral neuropathy managed by pain management, gastroparesis, end-stage kidney disease s/p transplant, and peripheral artery disease. He is chronically immunosuppressed.  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  He sees infectious disease. Health maintenance: He may be eligible for Prevnar 20.  I did see a pneumococcal vaccination from years ago.  Other immunizations will need to be looked into.  He may be eligible for Shingrix.  Although we will have to see if he is a candidate because of his transplant status.  He reports other  immunizations are up-to-date.  Assessment  1. Kidney transplant status   2. Pancreas transplant status (HCC)   3. Essential (primary) hypertension   4. Gastroparesis due to DM (HCC)   5. Immunosuppression (HCC)   6. PAD (peripheral artery disease) (HCC)   7. History of type 1 diabetes mellitus   8. 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  Will check A1c today.  Check lipids given he likely should be on a statin.  Will continue to follow along with his transplant specialist, vascular specialist, pain management specialist.  No medication changes are indicated today.  We had long discussions regarding goals of care, past medical history and chart has been updated. I spent a total of 60 minutes for this patient encounter. Time spent included preparation, face-to-face counseling with the patient and coordination of care, review of chart and records, and documentation of the encounter.  Follow up: 6 months for complete physical Orders Placed This Encounter  Procedures   Lipid panel   Hemoglobin A1c   TSH   No orders of the defined types were placed in this encounter.       05/15/2024    1:28 PM 03/08/2018    9:31 AM  Depression screen PHQ 2/9  Decreased Interest 0 0  Down, Depressed, Hopeless 0 0  PHQ -  2 Score 0 0    We updated and reviewed the patient's past history in detail and it is documented below.  Patient Active Problem List   Diagnosis Date Noted   Kidney transplant status 05/15/2024    Priority: High    Gregory Allen is a 53 y.o. 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 He has a history of failed islet transplants x 3. 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 PRA was 0% (although he has a h/o previous DSA with his first islet transplant). CMV +/+. Antibody induction was with Campath (Alemtuzumab).     Pancreas transplant status (HCC) 05/15/2024    Priority: High   History of organ or tissue transplant 05/12/2015    Priority: High    Overview:  Islet cell transplant    Essential (primary) hypertension 05/12/2015    Priority: High   Type 1 diabetes mellitus with hyperglycemia (HCC) 05/12/2015    Priority: High   Nonproliferative diabetic retinopathy (HCC) 02/08/2011    Priority: High    Overview:  Diabetic Retinopathy  ICD-10 cut over     Type 1 diabetes mellitus (HCC) 02/08/2011    Priority: High    Overview:  Type 1 Diabetes Mellitus - Uncontrolled; diagnosed age 13    Diabetic polyneuropathy associated with type 1 diabetes mellitus (HCC) 02/08/2011    Priority: High    Overview:  Diabetic Peripheral Neuropathy  ICD-10 cut over     Arthritis of hand, degenerative 09/08/2014    Priority: Low   S/P pancreatic islet cell transplantation (HCC) 05/15/2024   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) 05/15/2024   History of type 1 diabetes mellitus 05/15/2024   PAD (peripheral artery disease) (HCC) 05/15/2024   Chronic night sweats 12/04/2019   Chronic idiopathic constipation 07/07/2017   Immunosuppression (HCC) 04/15/2017   Gastroparesis due to DM (HCC) 07/19/2013   Health Maintenance  Topic Date Due   HEMOGLOBIN A1C  Never done   Hepatitis B Vaccines (2 of 3 - 19+ 3-dose series) 08/20/2013   Medicare Annual Wellness (AWV)  09/20/2023   COVID-19 Vaccine (7 - Pfizer risk 2024-25 season) 05/31/2024 (Originally 02/06/2024)   Zoster Vaccines- Shingrix (1 of 2) 08/15/2024 (Originally 12/06/1989)   Diabetic kidney evaluation - Urine ACR  02/28/2025 (Originally 12/06/1988)   DTaP/Tdap/Td (1 - Tdap) 05/15/2025  (Originally 12/06/1989)   Pneumococcal Vaccine 2-43 Years old (1 of 2 - PCV) 05/15/2025 (Originally 12/06/1989)   INFLUENZA VACCINE  06/07/2024   OPHTHALMOLOGY EXAM  02/05/2025   Diabetic kidney evaluation - eGFR measurement  04/25/2025   FOOT EXAM  05/15/2025   Colonoscopy  12/10/2031   HPV VACCINES  Aged Out   Meningococcal B Vaccine  Aged Out   Hepatitis C Screening  Discontinued   HIV Screening  Discontinued   Immunization History  Administered Date(s) Administered   Hepatitis B, ADULT 07/23/2013   Influenza,inj,Quad PF,6+ Mos 08/07/2012, 01/05/2018, 08/12/2020   Moderna Covid-19 Fall Seasonal Vaccine 73yrs & older 08/31/2022, 08/08/2023   PFIZER(Purple Top)SARS-COV-2 Vaccination 01/11/2020, 02/11/2020, 10/12/2020   PPD Test 01/05/2012, 01/12/2012   Pfizer Covid-19 Vaccine Bivalent Booster 98yrs & up 09/01/2021   Current Meds  Medication Sig   acetaminophen  (TYLENOL ) 500 MG tablet Take 1,000 mg by mouth every 6 (six) hours as needed for moderate pain or  headache.   amLODipine  (NORVASC ) 10 MG tablet Take 1 tablet (10 mg total) by mouth daily.   aspirin EC 81 MG tablet Take 81 mg by mouth daily.    carvedilol  (COREG ) 12.5 MG tablet Take 1 tablet (12.5 mg total) by mouth 2 (two) times daily with food.   mycophenolate  (MYFORTIC ) 180 MG EC tablet Take 3 tablets (540 mg total) by mouth every morning AND 2 tablets (360 mg total) every evening.   predniSONE  (DELTASONE ) 5 MG tablet Take 1 tablet by mouth once a day   sulfamethoxazole -trimethoprim  (BACTRIM ) 400-80 MG tablet 1 (one) tablet by mouth every Monday, Wednesday, Friday   tacrolimus  (PROGRAF ) 1 MG capsule Take 3 capsules (3 mg total) by mouth every morning AND 2 capsules (2 mg total) every evening.   [DISCONTINUED] tacrolimus  (PROGRAF ) 1 MG capsule Take 5 mg by mouth daily. 3 capsules in the am, 2 at bedtime.    Allergies: Patient is allergic to shellfish-derived products and ivp dye [iodinated contrast media]. 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 Past Surgical History Patient  has a past surgical history that includes Transplant pancreatic allograft; Kidney transplant; ABDOMINAL AORTOGRAM W/LOWER EXTREMITY (Bilateral, 06/12/2020); Back surgery; Ankle surgery (Right); Foot surgery (Left); Hand surgery (Right); and Ulnar tunnel release (Right, 10/28/2020). Family History: Patient family history includes Diabetes in his father and mother; Hypertension in his father and mother. Social History:  Patient  reports that he has never smoked. He has never used smokeless tobacco. He reports that he does not drink alcohol  and does not use drugs.  Review of Systems: Constitutional: negative for fever or malaise Ophthalmic: negative for photophobia, double vision or loss of vision Cardiovascular: negative for chest pain, dyspnea on exertion, or new LE swelling Respiratory: negative for SOB or persistent cough Gastrointestinal: negative for abdominal pain, change in bowel habits or melena Genitourinary: negative for dysuria or gross hematuria Musculoskeletal: negative for new gait disturbance or muscular weakness Integumentary: negative for new or persistent rashes Neurological: negative for TIA or stroke symptoms Psychiatric: negative for SI or delusions Allergic/Immunologic: negative for hives  Patient Care Team    Relationship Specialty Notifications Start End  Jodie Lavern CROME, MD PCP - General Family Medicine  05/15/24   Lucianne Wilbert Agreste, MD Referring Physician Nephrology  05/15/24   Gordy Elida CROME Consulting Physician General Surgery  05/15/24   Catherene Toribio Pac, MD Referring Physician Anesthesiology  05/15/24     Comment: chronic pain management, peripheral neuropathy  Valdemar Rogue, MD Consulting Physician Ophthalmology  05/15/24   Norine Manuelita LABOR, MD Consulting Physician Nephrology  05/15/24   Verne Bernardino Huxley, MD Consulting Physician Infectious Diseases  05/15/24     Objective  Vitals: BP 121/76   Pulse 72   Temp 98 F (36.7 C)   Ht 5' 10 (1.778 m)   Wt 192 lb 9.6 oz (87.4 kg)   SpO2 96%   BMI 27.64 kg/m  General:  Well developed, well nourished, no acute distress  Psych:  Alert and oriented,normal mood and affect HEENT:  Normocephalic, atraumatic, non-icteric sclera, supple neck without adenopathy, mass or thyromegaly Cardiovascular:  RRR without gallop, rub or murmur, + 1 pitting edema to mid calf Respiratory:  Good breath sounds bilaterally, CTAB with normal respiratory effort Skin:  Warm, no rashes  or suspicious lesions noted Neurologic:    Mental status is normal.  No foot lesions  Commons side effects, risks, benefits, and alternatives for medications and treatment plan prescribed today were discussed, and the patient expressed understanding of the given instructions. Patient is instructed to call or message via MyChart if he/she has any questions or concerns regarding our treatment plan. No barriers to understanding were identified. We discussed Red Flag symptoms and signs in detail. Patient expressed understanding regarding what to do in case of urgent or emergency type symptoms.  Medication list was reconciled, printed and provided to the patient in AVS. Patient instructions and summary information was reviewed with the patient as documented in the AVS. This note was prepared with assistance of Dragon voice recognition software. Occasional wrong-word or sound-a-like substitutions may have occurred due to the inherent limitations of voice recognition software  This visit occurred during the SARS-CoV-2 public health emergency.  Safety protocols were in place, including screening questions  prior to the visit, additional usage of staff PPE, and extensive cleaning of exam room while observing appropriate contact time as indicated for disinfecting solutions.

## 2024-05-15 NOTE — Patient Instructions (Signed)
 Please return in 6 months for your annual complete physical; please come fasting.    I will release your lab results to you on your MyChart account with further instructions. You may see the results before I do, but when I review them I will send you a message with my report or have my assistant call you if things need to be discussed. Please reply to my message with any questions. Thank you!   If you have any questions or concerns, please don't hesitate to send me a message via MyChart or call the office at [phone]. Thank you for visiting with us  today! It's our pleasure caring for you.    VISIT SUMMARY: Today, we discussed your complex medical history and established a care plan to manage your conditions. You are doing well post-transplant, and we will continue to monitor your health closely.  YOUR PLAN: -TYPE 1 DIABETES MELLITUS (POST-PANCREAS TRANSPLANT): You are currently insulin-free after your pancreas transplant. We will regularly check your A1c levels to ensure your blood sugar remains stable.  -CHRONIC KIDNEY DISEASE (POST-KIDNEY TRANSPLANT): Your kidney function is stable after your kidney transplant. Continue regular follow-ups with your kidney specialist, Dr. Norine.  -PERIPHERAL NEUROPATHY: You have severe nerve pain that worsens with weather changes. Although you declined nerve stent treatment, we will monitor your condition closely.  -PERIPHERAL VASCULAR DISEASE: You have blocked arteries in your legs causing pain when walking. Since stenting is not an option, regular exercise is recommended. Continue seeing Dr. Sheree for management.  -VENOUS INSUFFICIENCY: You have leg swelling, which may be related to your neuropathy. We will monitor this and consider diuretics if the swelling worsens.  -GASTROPARESIS: You have stomach discomfort due to slow stomach emptying. We will continue to monitor your symptoms.  -DIABETIC RETINOPATHY: You have eye problems related to diabetes.  Regular eye exams are important to monitor this condition.  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 This will be managed with infusions as needed.  -GENERAL HEALTH MAINTENANCE: You are due for routine blood work and vaccinations. We will check your cholesterol levels and update your vaccinations as needed. If your cholesterol is high, we may consider starting you on a statin.  INSTRUCTIONS: Please schedule a physical exam in 6 months. We will also plan for biannual follow-up visits to monitor your chronic conditions.                      Contains text generated by Abridge.                                 Contains text generated by Abridge.

## 2024-05-16 ENCOUNTER — Telehealth: Payer: Self-pay

## 2024-05-16 NOTE — Telephone Encounter (Signed)
 Copied from CRM [id number]. Topic: Clinical - Lab/Test Results >> May 16, 2024 12:16 PM Viola F wrote: Reason for CRM: Patient requesting call back regarding lab results from yesterday, please call [phone] (M)

## 2024-05-18 ENCOUNTER — Ambulatory Visit: Payer: Self-pay | Admitting: Family Medicine

## 2024-05-18 MED ORDER — ROSUVASTATIN CALCIUM 5 MG PO TABS: 5.0000 mg | ORAL_TABLET | Freq: Every evening | ORAL | 3 refills | Status: DC

## 2024-05-18 NOTE — Progress Notes (Signed)
 See mychart note The 10-year ASCVD risk score (Arnett DK, et al., 2019) is: 16.8%   Values used to calculate the score:     Age: 53 years     Clincally relevant sex: Male     Is Non-Hispanic African American: Yes     Diabetic: Yes     Tobacco smoker: No     Systolic Blood Pressure: 121 mmHg     Is BP treated: Yes     HDL Cholesterol: 40.6 mg/dL     Total Cholesterol: 170 mg/dL

## 2024-05-21 ENCOUNTER — Other Ambulatory Visit: Payer: Self-pay

## 2024-05-21 ENCOUNTER — Telehealth: Payer: Self-pay

## 2024-05-21 MED ORDER — ROSUVASTATIN CALCIUM 5 MG PO TABS: 5.0000 mg | ORAL_TABLET | Freq: Every evening | ORAL | 3 refills | Status: AC

## 2024-05-21 NOTE — Telephone Encounter (Signed)
 Copied from CRM [id number]. Topic: Clinical - Prescription Issue >> May 21, 2024  9:22 AM Jasmin G wrote: Reason for CRM: Patient got a refill on rosuvastatin  (CRESTOR ) 5 MG tablet sent to the wrong pharmacy. Pt. Preferred pharmacy is: [id number] [place], [place] - [address] AT [facility] [address] [place] [address] Phone: [phone] Fax: [phone] Hours: Open 24 hours

## 2024-05-30 ENCOUNTER — Encounter: Payer: Self-pay | Admitting: Podiatry

## 2024-05-30 ENCOUNTER — Ambulatory Visit (INDEPENDENT_AMBULATORY_CARE_PROVIDER_SITE_OTHER): Admitting: Podiatry

## 2024-05-30 VITALS — Ht 70.0 in | Wt 192.6 lb

## 2024-05-30 DIAGNOSIS — M722 Plantar fascial fibromatosis: Secondary | ICD-10-CM

## 2024-05-30 DIAGNOSIS — M7752 Other enthesopathy of left foot: Secondary | ICD-10-CM

## 2024-05-30 MED ORDER — TRIAMCINOLONE ACETONIDE 10 MG/ML IJ SUSP
10.0000 mg | Freq: Once | INTRAMUSCULAR | Status: AC
  Administered 2024-05-30: 10 mg via INTRA_ARTICULAR

## 2024-05-30 NOTE — Progress Notes (Signed)
 Subjective:   Patient ID: Gregory Allen, male   DOB: 53 y.o.   MRN: [id number]   HPI Patient presents with inflammation around the left foot with fluid buildup around the fifth metatarsal head and several lesions on both feet   ROS      Objective:  Physical Exam  Inflammatory capsulitis left fifth MPJ fluid buildup around the joint along with lesions bilateral     Assessment:  Pain with pressure to the fifth MPJ left     Plan:  H&P done sterile prep injected the capsule 3 mg dexamethasone Kenalog  5 mg Liken applied sterile dressing reappoint to recheck

## 2024-06-14 DIAGNOSIS — Z9483 Pancreas transplant status: Secondary | ICD-10-CM | POA: Diagnosis not present

## 2024-06-14 DIAGNOSIS — Z940 Kidney transplant status: Secondary | ICD-10-CM | POA: Diagnosis not present

## 2024-07-04 ENCOUNTER — Ambulatory Visit (INDEPENDENT_AMBULATORY_CARE_PROVIDER_SITE_OTHER): Admitting: Podiatry

## 2024-07-04 ENCOUNTER — Encounter: Payer: Self-pay | Admitting: Podiatry

## 2024-07-04 VITALS — Ht 70.0 in | Wt 192.0 lb

## 2024-07-04 DIAGNOSIS — Q828 Other specified congenital malformations of skin: Secondary | ICD-10-CM

## 2024-07-04 DIAGNOSIS — E1140 Type 2 diabetes mellitus with diabetic neuropathy, unspecified: Secondary | ICD-10-CM

## 2024-07-04 DIAGNOSIS — E1149 Type 2 diabetes mellitus with other diabetic neurological complication: Secondary | ICD-10-CM

## 2024-07-05 ENCOUNTER — Ambulatory Visit: Admitting: Podiatry

## 2024-07-05 NOTE — Progress Notes (Signed)
 Subjective:   Patient ID: Gregory Allen, male   DOB: 53 y.o.   MRN: [id number]   HPI Patient presents with chronic lesions of the left first metatarsal head fifth metatarsal head with long-term diabetes who has had a pancreatic and kidney transplant   ROS      Objective:  Physical Exam  Neurovascular status was unchanged with several lesions plantar aspect left that are painful when pressed and he cannot take care of himself     Assessment:  At risk diabetic long-term use had transplant with lesions x 2 left     Plan:  Careful debridement of lesions no iatrogenic bleeding continue daily inspections of his feet and reappoint as symptoms indicate

## 2024-07-22 ENCOUNTER — Telehealth: Payer: Self-pay | Admitting: Family Medicine

## 2024-07-22 NOTE — Telephone Encounter (Unsigned)
 Copied from CRM [id number]. Topic: Clinical - Medication Refill >> Jul 22, 2024  1:51 PM Chiquita SQUIBB wrote: Medication: Gabapentin  300 MG Medication is not showing as a current medication in the med list.   Has the patient contacted their pharmacy? Yes (Agent: If no, request that the patient contact the pharmacy for the refill. If patient does not wish to contact the pharmacy document the reason why and proceed with request.) (Agent: If yes, when and what did the pharmacy advise?)  This is the patient's preferred pharmacy:   [id number] [place], [place] - [address] AT [facility] [address] [place] [address] Phone: [phone] Fax: [phone]  Is this the correct pharmacy for this prescription? Yes If no, delete pharmacy and type the correct one.   Has the prescription been filled recently? No  Is the patient out of the medication? No  Has the patient been seen for an appointment in the last year OR does the patient have an upcoming appointment? Yes  Can we respond through MyChart? Yes  Agent: Please be advised that Rx refills may take up to 3 business days. We ask that you follow-up with your pharmacy.

## 2024-07-24 MED ORDER — GABAPENTIN 600 MG PO TABS: 600.0000 mg | ORAL_TABLET | Freq: Two times a day (BID) | ORAL | 3 refills | Status: DC

## 2024-07-26 ENCOUNTER — Other Ambulatory Visit: Payer: Self-pay

## 2024-07-26 ENCOUNTER — Telehealth: Payer: Self-pay

## 2024-07-26 MED ORDER — GABAPENTIN 600 MG PO TABS: 600.0000 mg | ORAL_TABLET | Freq: Two times a day (BID) | ORAL | 3 refills | Status: AC

## 2024-07-26 NOTE — Telephone Encounter (Signed)
 Copied from CRM #[id number]. Topic: Clinical - Prescription Issue >> Jul 26, 2024  2:52 PM Dedra B wrote: Reason for CRM: Pt Gabapentin  was sent to the wrong pharmacy. [facility] [address]

## 2024-07-30 DIAGNOSIS — Z940 Kidney transplant status: Secondary | ICD-10-CM | POA: Diagnosis not present

## 2024-07-31 ENCOUNTER — Other Ambulatory Visit (HOSPITAL_COMMUNITY): Payer: Self-pay

## 2024-07-31 MED ORDER — TACROLIMUS 1 MG PO CAPS
ORAL_CAPSULE | ORAL | 5 refills | Status: DC
  Filled 2024-07-31: qty 180, 30d supply, fill #0

## 2024-08-01 ENCOUNTER — Ambulatory Visit (INDEPENDENT_AMBULATORY_CARE_PROVIDER_SITE_OTHER): Admitting: Family Medicine

## 2024-08-01 ENCOUNTER — Encounter: Payer: Self-pay | Admitting: Family Medicine

## 2024-08-01 VITALS — BP 130/85 | HR 72 | Temp 98.3°F | Ht 70.0 in | Wt 191.0 lb

## 2024-08-01 DIAGNOSIS — Z23 Encounter for immunization: Secondary | ICD-10-CM

## 2024-08-01 DIAGNOSIS — E1065 Type 1 diabetes mellitus with hyperglycemia: Secondary | ICD-10-CM

## 2024-08-01 DIAGNOSIS — L918 Other hypertrophic disorders of the skin: Secondary | ICD-10-CM | POA: Diagnosis not present

## 2024-08-01 NOTE — Progress Notes (Signed)
 Skin Tag Removal Procedure Note Diagnosis: inflamed skin tags x3 on face; interfere with shaving and cause pain Location: left cheek Informed Consent: Discussed risks (permanent scarring, infection, pain, bleeding, bruising, redness, and recurrence of the lesion) and benefits of the procedure, as well as the alternatives. He is aware that skin tags are benign lesions, and their removal is often not considered medically necessary. Informed consent was obtained. Preparation: The area was prepared in a standard fashion. Anesthesia: Lidocaine  1% with epinephrine Procedure Details: Iris scissors were used to perform sharp removal. Silver nitrate was applied for hemostasis. Ointment and bandage were applied where needed. The patient tolerated the procedure well. Total number of lesions treated: 3 Plan: The patient was instructed on post-op care. Recommend OTC analgesia as needed for pain.

## 2024-08-19 ENCOUNTER — Telehealth: Payer: Self-pay

## 2024-08-19 NOTE — Telephone Encounter (Signed)
 Called pt to schedule Annual Wellness Visit with Nurse Health advisor. Patient requested a message be sent to PCP, he states he has had nasal drainage (yellowish), and dry cough for 3 days. No fever. [facility] [address] [facility], advised patient he made need an office visit but I would send a message to office clinical staff, and someone would call him and advise.

## 2024-08-20 ENCOUNTER — Ambulatory Visit (INDEPENDENT_AMBULATORY_CARE_PROVIDER_SITE_OTHER)

## 2024-08-20 VITALS — Ht 70.0 in | Wt 191.0 lb

## 2024-08-20 DIAGNOSIS — Z748 Other problems related to care provider dependency: Secondary | ICD-10-CM

## 2024-08-20 DIAGNOSIS — Z0000 Encounter for general adult medical examination without abnormal findings: Secondary | ICD-10-CM | POA: Diagnosis not present

## 2024-08-20 NOTE — Patient Instructions (Signed)
 [name],  Thank you for taking the time for your Medicare Wellness Visit. I appreciate your continued commitment to your health goals. Please review the care plan we discussed, and feel free to reach out if I can assist you further.  Medicare recommends these wellness visits once per year to help you and your care team stay ahead of potential health issues. These visits are designed to focus on prevention, allowing your provider to concentrate on managing your acute and chronic conditions during your regular appointments.  Please note that Annual Wellness Visits do not include a physical exam. Some assessments may be limited, especially if the visit was conducted virtually. If needed, we may recommend a separate in-person follow-up with your provider.  Ongoing Care Seeing your primary care provider every 3 to 6 months helps us  monitor your health and provide consistent, personalized care.   Referrals If a referral was made during today's visit and you haven't received any updates within two weeks, please contact the referred provider directly to check on the status.  Recommended Screenings:  Health Maintenance  Topic Date Due   Pneumococcal Vaccine for age over 16 (1 of 2 - PCV) Never done   Zoster (Shingles) Vaccine (1 of 2) Never done   Hepatitis B Vaccine (2 of 3 - 19+ 3-dose series) 08/20/2013   Medicare Annual Wellness Visit  09/20/2023   COVID-19 Vaccine (7 - 2025-26 season) 07/08/2024   Yearly kidney health urinalysis for diabetes  02/28/2025*   DTaP/Tdap/Td vaccine (1 - Tdap) 05/15/2025*   Hemoglobin A1C  11/15/2024   Eye exam for diabetics  02/05/2025   Yearly kidney function blood test for diabetes  04/25/2025   Complete foot exam   05/15/2025   Colon Cancer Screening  12/10/2031   Flu Shot  Completed   HPV Vaccine  Aged Out   Meningitis B Vaccine  Aged Out   Hepatitis C Screening  Discontinued   HIV Screening  Discontinued  *Topic was postponed. The date shown is not  the original due date.       03/26/2023    7:59 AM  Advanced Directives  Does Patient Have a Medical Advance Directive? No  Would patient like information on creating a medical advance directive? No - Patient declined   Advance Care Planning is important because it: Ensures you receive medical care that aligns with your values, goals, and preferences. Provides guidance to your family and loved ones, reducing the emotional burden of decision-making during critical moments.  Vision: Annual vision screenings are recommended for early detection of glaucoma, cataracts, and diabetic retinopathy. These exams can also reveal signs of chronic conditions such as diabetes and high blood pressure.  Dental: Annual dental screenings help detect early signs of oral cancer, gum disease, and other conditions linked to overall health, including heart disease and diabetes.  Please see the attached documents for additional preventive care recommendations.

## 2024-08-20 NOTE — Progress Notes (Signed)
 Subjective:   Gregory Allen is a 53 y.o. who presents for a Medicare Wellness preventive visit.  As a reminder, Annual Wellness Visits don't include a physical exam, and some assessments may be limited, especially if this visit is performed virtually. We may recommend an in-person follow-up visit with your provider if needed.  Visit Complete: Virtual I connected with  Gregory Allen on 08/20/24 by a audio enabled telemedicine application and verified that I am speaking with the correct person using two identifiers.  Patient Location: Home  Provider Location: Office/Clinic  I discussed the limitations of evaluation and management by telemedicine. The patient expressed understanding and agreed to proceed.  Vital Signs: Because this visit was a virtual/telehealth visit, some criteria may be missing or patient reported. Any vitals not documented were not able to be obtained and vitals that have been documented are patient reported.  VideoDeclined- This patient declined Librarian, academic. Therefore the visit was completed with audio only.  Persons Participating in Visit: Patient.  AWV Questionnaire: No: Patient Medicare AWV questionnaire was not completed prior to this visit.  Cardiac Risk Factors include: advanced age (>46men, >45 women);diabetes mellitus;male gender;dyslipidemia;hypertension     Objective:    Today's Vitals   08/20/24 1534 08/20/24 1535  Weight: 191 lb (86.6 kg)   Height: 5' 10 (1.778 m)   PainSc:  9    Body mass index is 27.41 kg/m.     08/20/2024    3:39 PM 03/26/2023    7:59 AM 10/28/2020   11:49 AM 10/21/2020    5:27 PM 06/12/2020   10:09 AM 03/01/2019    1:08 PM 03/23/2018    1:23 PM  Advanced Directives  Does Patient Have a Medical Advance Directive? No No Yes Yes No No No   Type of Advance Directive    Healthcare Power of Attorney     Does patient want to make changes to medical advance directive?   No - Patient  declined No - Patient declined     Would patient like information on creating a medical advance directive? No - Patient declined No - Patient declined No - Patient declined  No - Patient declined No - Patient declined       Data saved with a previous flowsheet row definition    Current Medications (verified) Outpatient Encounter Medications as of 08/20/2024  Medication Sig   acetaminophen  (TYLENOL ) 500 MG tablet Take 1,000 mg by mouth every 6 (six) hours as needed for moderate pain or headache.   amLODipine  (NORVASC ) 10 MG tablet Take 1 tablet (10 mg total) by mouth daily.   aspirin EC 81 MG tablet Take 81 mg by mouth daily.    carvedilol  (COREG ) 12.5 MG tablet Take 1 tablet (12.5 mg total) by mouth 2 (two) times daily with food.   gabapentin  (NEURONTIN ) 600 MG tablet Take 1 tablet (600 mg total) by mouth 2 (two) times daily.   mycophenolate  (MYFORTIC ) 180 MG EC tablet Take 3 tablets (540 mg total) by mouth every morning AND 2 tablets (360 mg total) every evening.   predniSONE  (DELTASONE ) 5 MG tablet Take 1 tablet by mouth once a day   rosuvastatin  (CRESTOR ) 5 MG tablet Take 1 tablet (5 mg total) by mouth at bedtime.   sulfamethoxazole -trimethoprim  (BACTRIM ) 400-80 MG tablet 1 (one) tablet by mouth every Monday, Wednesday, Friday   tacrolimus  (PROGRAF ) 1 MG capsule Take 3 capsules (3 mg total) by mouth every morning AND 2 capsules (2 mg total) every evening.  No facility-administered encounter medications on file as of 08/20/2024.    Allergies (verified) Shellfish protein-containing drug products and Ivp dye [iodinated contrast media]   History: Past Medical History:  Diagnosis Date   Arthritis    left knee   Diabetes mellitus without complication (HCC)    no meds since pancreatic transplant 2018   Hypertension    Kidney transplant recipient 2018   PAD (peripheral artery disease)    Pancreas transplant status (HCC) 2018   Peripheral neuropathy    Ulnar neuropathy of both upper  extremities 09/15/2020   Past Surgical History:  Procedure Laterality Date   ABDOMINAL AORTOGRAM W/LOWER EXTREMITY Bilateral 06/12/2020   Procedure: ABDOMINAL AORTOGRAM W/LOWER EXTREMITY;  Surgeon: Harvey Carlin BRAVO, MD;  Location: [facility];  Service: Cardiovascular;  Laterality: Bilateral;  Bilateral    ANKLE SURGERY Right    BACK SURGERY     FOOT SURGERY Left    HAND SURGERY Right    thumb   KIDNEY TRANSPLANT     TRANSPLANT PANCREATIC ALLOGRAFT     ULNAR TUNNEL RELEASE Right 10/28/2020   Procedure: RIGHT CUBITAL TUNNEL RELEASE, TRANSPOSITION;  Surgeon: Jerri Kay HERO, MD;  Location: [facility];  Service: Orthopedics;  Laterality: Right;   Family History  Problem Relation Age of Onset   Diabetes Mother    Hypertension Mother    Diabetes Father    Hypertension Father    Colon cancer Neg Hx    Colon polyps Neg Hx    Social History   Socioeconomic History   Marital status: Married    Spouse name: Not on file   Number of children: 2   Years of education: 14   Highest education level: Not on file  Occupational History   Occupation: Disabled   Tobacco Use   Smoking status: Never   Smokeless tobacco: Never  Vaping Use   Vaping status: Never Used  Substance and Sexual Activity   Alcohol  use: No    Alcohol /week: 0.0 standard drinks of alcohol    Drug use: No   Sexual activity: Yes  Other Topics Concern   Not on file  Social History Narrative   Right handed    Coffee daily   Social Drivers of Health   Financial Resource Strain: Low Risk  (08/20/2024)   Overall Financial Resource Strain (CARDIA)    Difficulty of Paying Living Expenses: Not hard at all  Food Insecurity: No Food Insecurity (08/20/2024)   Hunger Vital Sign    Worried About Running Out of Food in the Last Year: Never true    Ran Out of Food in the Last Year: Never true  Transportation Needs: Unmet Transportation Needs (08/20/2024)   PRAPARE - Scientist, research (physical sciences) (Medical): Yes    Lack of Transportation (Non-Medical): No  Physical Activity: Insufficiently Active (08/20/2024)   Exercise Vital Sign    Days of Exercise per Week: 3 days    Minutes of Exercise per Session: 10 min  Stress: No Stress Concern Present (08/20/2024)   [employer] of Occupational Health - Occupational Stress Questionnaire    Feeling of Stress: Not at all  Social Connections: Moderately Integrated (08/20/2024)   Social Connection and Isolation Panel    Frequency of Communication with Friends and Family: More than three times a week    Frequency of Social Gatherings with Friends and Family: More than three times a week    Attends Religious Services: More than 4 times per year  Active Member of Clubs or Organizations: No    Attends Banker Meetings: Never    Marital Status: Married    Tobacco Counseling Counseling given: Not Answered    Clinical Intake:  Pre-visit preparation completed: Yes  Pain : 0-10 Pain Score: 9  Pain Type: Chronic pain Pain Location: Generalized Pain Descriptors / Indicators: Numbness, Throbbing Pain Onset: More than a month ago Pain Frequency: Constant     BMI - recorded: 27.41 Nutritional Status: BMI 25 -29 Overweight Nutritional Risks: None Diabetes: Yes CBG done?: No Did pt. bring in CBG monitor from home?: No  Lab Results  Component Value Date   HGBA1C 6.3 05/15/2024     How often do you need to have someone help you when you read instructions, pamphlets, or other written materials from your doctor or pharmacy?: 1 - Never  Interpreter Needed?: No  Information entered by :: Ellouise Haws, LPN   Activities of Daily Living     08/20/2024    3:37 PM  In your present state of health, do you have any difficulty performing the following activities:  Hearing? 1  Comment HOH  Vision? 0  Difficulty concentrating or making decisions? 0  Walking or climbing stairs? 1  Comment numbness  increases  Dressing or bathing? 1  Comment wife assist  Doing errands, shopping? 0  Preparing Food and eating ? N  Using the Toilet? N  In the past six months, have you accidently leaked urine? N  Do you have problems with loss of bowel control? N  Managing your Medications? Y  Comment wife  Managing your Finances? N  Housekeeping or managing your Housekeeping? N    Patient Care Team: Jodie Lavern CROME, MD as PCP - General (Family Medicine) Lucianne, Wilbert Agreste, MD as Referring Physician (Nephrology) Gordy Elida CROME as Consulting Physician (General Surgery) Bintrim, Toribio Pac, MD as Referring Physician (Anesthesiology) Valdemar Rogue, MD as Consulting Physician (Ophthalmology) Norine Manuelita LABOR, MD as Consulting Physician (Nephrology) Maves, Bernardino Huxley, MD as Consulting Physician (Infectious Diseases)  I have updated your Care Teams any recent Medical Services you may have received from other providers in the past year.     Assessment:   This is a routine wellness examination for Gregory Allen.  Hearing/Vision screen Hearing Screening - Comments:: Pt stated HOH Vision Screening - Comments:: Wears rx glasses - up to date with routine eye exams with pt couldn't remember name    Goals Addressed             This Visit's Progress    Patient Stated       Increase walking to do it without cane        Depression Screen     08/20/2024    3:40 PM 08/01/2024   11:45 AM 05/15/2024    1:28 PM 03/08/2018    9:31 AM  PHQ 2/9 Scores  PHQ - 2 Score 1 0 0 0    Fall Risk     08/20/2024    3:42 PM 08/01/2024   11:44 AM 05/15/2024    1:28 PM 03/08/2018    9:31 AM  Fall Risk   Falls in the past year? 0 0 0 No   Number falls in past yr: 0 0 0   Injury with Fall? 0 0 0   Risk for fall due to : Impaired balance/gait;Impaired mobility No Fall Risks No Fall Risks   Follow up Falls prevention discussed Falls evaluation completed Falls evaluation completed  Data saved with  a previous flowsheet row definition    MEDICARE RISK AT HOME:  Medicare Risk at Home Any stairs in or around the home?: No If so, are there any without handrails?: No Home free of loose throw rugs in walkways, pet beds, electrical cords, etc?: Yes Adequate lighting in your home to reduce risk of falls?: Yes Life alert?: Yes Use of a cane, walker or w/c?: Yes Grab bars in the bathroom?: Yes Shower chair or bench in shower?: Yes Elevated toilet seat or a handicapped toilet?: Yes  TIMED UP AND GO:  Was the test performed?  No  Cognitive Function: 6CIT completed        08/20/2024    3:43 PM  6CIT Screen  What Year? 0 points  What month? 0 points  What time? 0 points  Count back from 20 0 points  Months in reverse 0 points  Repeat phrase 0 points  Total Score 0 points    Immunizations Immunization History  Administered Date(s) Administered   Hep B, Unspecified 07/23/2013   Hepatitis B, ADULT 07/23/2013   Hepatitis B, PED/ADOLESCENT 07/23/2013   INFLUENZA, HIGH DOSE SEASONAL PF 08/08/2023   Influenza, Seasonal, Injecte, Preservative Fre 08/01/2024   Influenza,inj,Quad PF,6+ Mos 08/07/2012, 01/05/2018, 08/12/2020, 09/01/2021, 08/31/2022   Influenza-Unspecified 08/07/2012   Moderna Covid-19 Fall Seasonal Vaccine 90yrs & older 08/31/2022, 08/08/2023   PFIZER(Purple Top)SARS-COV-2 Vaccination 01/11/2020, 02/11/2020, 10/12/2020   PPD Test 01/05/2012, 01/12/2012   Pfizer Covid-19 Vaccine Bivalent Booster 51yrs & up 09/01/2021    Screening Tests Health Maintenance  Topic Date Due   Pneumococcal Vaccine: 50+ Years (1 of 2 - PCV) Never done   Zoster Vaccines- Shingrix (1 of 2) Never done   Hepatitis B Vaccines 19-59 Average Risk (2 of 3 - 19+ 3-dose series) 08/20/2013   COVID-19 Vaccine (7 - 2025-26 season) 07/08/2024   Diabetic kidney evaluation - Urine ACR  02/28/2025 (Originally 12/06/1988)   DTaP/Tdap/Td (1 - Tdap) 05/15/2025 (Originally 12/06/1989)   HEMOGLOBIN A1C   11/15/2024   OPHTHALMOLOGY EXAM  02/05/2025   Diabetic kidney evaluation - eGFR measurement  04/25/2025   FOOT EXAM  05/15/2025   Medicare Annual Wellness (AWV)  08/20/2025   Colonoscopy  12/10/2031   Influenza Vaccine  Completed   HPV VACCINES  Aged Out   Meningococcal B Vaccine  Aged Out   Hepatitis C Screening  Discontinued   HIV Screening  Discontinued    Health Maintenance Items Addressed: Vaccines Due: and discussed , See Nurse Notes at the end of this note  Additional Screening:  Vision Screening: Recommended annual ophthalmology exams for early detection of glaucoma and other disorders of the eye. Is the patient up to date with their annual eye exam?  Yes  Who is the provider or what is the name of the office in which the patient attends annual eye exams? Unsure of providers name   Dental Screening: Recommended annual dental exams for proper oral hygiene  Community Resource Referral / Chronic Care Management: CRR required this visit?  Yes   CCM required this visit?  No   Plan:    I have personally reviewed and noted the following in the patient's chart:   Medical and social history Use of alcohol , tobacco or illicit drugs  Current medications and supplements including opioid prescriptions. Patient is not currently taking opioid prescriptions. Functional ability and status Nutritional status Physical activity Advanced directives List of other physicians Hospitalizations, surgeries, and ER visits in previous 12 months Vitals  Screenings to include cognitive, depression, and falls Referrals and appointments  In addition, I have reviewed and discussed with patient certain preventive protocols, quality metrics, and best practice recommendations. A written personalized care plan for preventive services as well as general preventive health recommendations were provided to patient.   Ellouise VEAR Haws, LPN   89/[id number]/7974   After Visit Summary: (MyChart) Due to this  being a telephonic visit, the after visit summary with patients personalized plan was offered to patient via MyChart   Notes: Nothing significant to report at this time.

## 2024-08-21 ENCOUNTER — Telehealth: Payer: Self-pay | Admitting: *Deleted

## 2024-08-21 NOTE — Progress Notes (Signed)
 Complex Care Management Note  Care Guide Note 08/21/2024 Name: Gregory Allen MRN: [id number] DOB: 10-27-1971  Gregory Allen is a 53 y.o. year old male who sees Jodie Lavern CROME, MD for primary care. I reached out to [employer] by phone today to offer complex care management services.  Gregory Allen was given information about Complex Care Management services today including:   The Complex Care Management services include support from the care team which includes your Nurse Care Manager, Clinical Social Worker, or Pharmacist.  The Complex Care Management team is here to help remove barriers to the health concerns and goals most important to you. Complex Care Management services are voluntary, and the patient may decline or stop services at any time by request to their care team member.   Complex Care Management Consent Status: Patient agreed to services and verbal consent obtained.   Follow up plan:  Telephone appointment with complex care management team member scheduled for:  08/22/2024 and 08/26/2024  Encounter Outcome:  Patient Scheduled  Thedford Franks, CMA [place]  [facility], [facility] Guide Direct Dial: [phone]  Fax: [phone] Website: [email or web address]

## 2024-08-22 ENCOUNTER — Telehealth: Payer: Self-pay

## 2024-08-22 NOTE — Patient Instructions (Signed)
 Joshuwa Cindie - I am sorry I was unable to reach you today for our scheduled appointment. I work with Jodie Lavern CROME, MD and am calling to support your healthcare needs. Please contact me at [phone] at your earliest convenience. I look forward to speaking with you soon.   Thank you,  Tillman Gardener, BSW [place]  [facility], [facility] Social Worker Direct Dial: [phone]  Fax: [phone] Website: [email or web address]

## 2024-08-26 ENCOUNTER — Telehealth: Payer: Self-pay

## 2024-08-29 ENCOUNTER — Telehealth: Payer: Self-pay | Admitting: *Deleted

## 2024-08-29 NOTE — Progress Notes (Unsigned)
 Complex Care Management Care Guide Note  08/29/2024 Name: Hilmer Aliberti MRN: [id number] DOB: 08-23-1971  Najeh Meroney is a 53 y.o. year old male who is a primary care patient of Jodie Lavern CROME, MD and is actively engaged with the care management team. I reached out to [employer] by phone today to assist with re-scheduling  with the BSW.  Follow up plan: Unsuccessful telephone outreach attempt made. A HIPAA compliant phone message was left for the patient providing contact information and requesting a return call.  Harlene Satterfield  [facility]  [facility], [facility] Guide  Direct Dial: [phone]  Fax [phone]

## 2024-08-30 NOTE — Progress Notes (Signed)
 Complex Care Management Care Guide Note  08/30/2024 Name: Gregory Allen MRN: [id number] DOB: 1971/09/15  [name] is a 53 y.o. year old male who is a primary care patient of Jodie Lavern CROME, MD and is actively engaged with the care management team. I reached out to [employer] by phone today to assist with re-scheduling  with the BSW.  Follow up plan: Telephone appointment with complex care management team member scheduled for:  09/12/2024  Thedford Franks, CMA [place]  [facility], [facility] Guide Direct Dial: [phone]  Fax: [phone] Website: [email or web address]

## 2024-09-03 ENCOUNTER — Emergency Department (HOSPITAL_BASED_OUTPATIENT_CLINIC_OR_DEPARTMENT_OTHER)
Admission: EM | Admit: 2024-09-03 | Discharge: 2024-09-03 | Disposition: A | Discharge status: Home / Self Care | Payer: Self-pay | Attending: Emergency Medicine | Admitting: Emergency Medicine

## 2024-09-03 ENCOUNTER — Other Ambulatory Visit: Payer: Self-pay

## 2024-09-03 ENCOUNTER — Encounter (HOSPITAL_BASED_OUTPATIENT_CLINIC_OR_DEPARTMENT_OTHER): Payer: Self-pay

## 2024-09-03 DIAGNOSIS — Y92410 Unspecified street and highway as the place of occurrence of the external cause: Secondary | ICD-10-CM | POA: Diagnosis not present

## 2024-09-03 DIAGNOSIS — Z7902 Long term (current) use of antithrombotics/antiplatelets: Secondary | ICD-10-CM | POA: Diagnosis not present

## 2024-09-03 DIAGNOSIS — S76312A Strain of muscle, fascia and tendon of the posterior muscle group at thigh level, left thigh, initial encounter: Secondary | ICD-10-CM | POA: Diagnosis not present

## 2024-09-03 DIAGNOSIS — S76912A Strain of unspecified muscles, fascia and tendons at thigh level, left thigh, initial encounter: Secondary | ICD-10-CM | POA: Insufficient documentation

## 2024-09-03 DIAGNOSIS — M546 Pain in thoracic spine: Secondary | ICD-10-CM | POA: Insufficient documentation

## 2024-09-03 DIAGNOSIS — S79922A Unspecified injury of left thigh, initial encounter: Secondary | ICD-10-CM | POA: Diagnosis present

## 2024-09-03 MED ORDER — OXYCODONE-ACETAMINOPHEN 5-325 MG PO TABS: 1.0000 | ORAL_TABLET | Freq: Four times a day (QID) | ORAL | 0 refills | Status: AC | PRN

## 2024-09-03 NOTE — Discharge Instructions (Addendum)
 Your physical exam was overall reassuring.  In addition to Tylenol , opiates has been prescribed for breakthrough pain.  Follow-up with your PCP to ensure resolution.
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
## 2024-09-12 ENCOUNTER — Telehealth: Payer: Self-pay

## 2024-09-16 ENCOUNTER — Other Ambulatory Visit: Payer: Self-pay

## 2024-09-16 NOTE — Patient Instructions (Signed)
 Visit Information  Thank you for taking time to visit with me today. Please don't hesitate to contact me if I can be of assistance to you before our next scheduled appointment.  Our next appointment is by telephone on 09/23/24 at 1pm Please call the care guide team at [phone] if you need to cancel or reschedule your appointment.   Following is a copy of your care plan:   Goals Addressed             This Visit's Progress    BSW Goals       Current SDOH Barriers:  Transportation  Interventions: Patient interviewed and appropriate screenings performed Advised patient to contact [employer] for transportation options/coverage. Patient states he can ride the city bus as it is only 20 steps from his home.  Would need the bus that has a drop down lift to board.          Please call 911 if you are experiencing a Mental Health or Behavioral Health Crisis or need someone to talk to.  Patient verbalized understanding of Care plan and visit instructions communicated this visit  Gregory Allen, BSW [place]  [facility], [facility] Social Worker Direct Dial: [phone]  Fax: [phone] Website: [email or web address]

## 2024-09-16 NOTE — Patient Outreach (Signed)
 Social Drivers of Health  Community Resource and Care Coordination Visit Note   09/16/2024  Name: Gregory Allen MRN: [id number] DOB:July 12, 1971  Situation: Referral received for [facility] needs assessment and assistance related to Transportation. I obtained verbal consent from Patient.  Visit completed with Patient on the phone.   Background:   SDOH Interventions Today    Flowsheet Row Most Recent Value  SDOH Interventions   Food Insecurity Interventions Intervention Not Indicated  Housing Interventions Intervention Not Indicated  Transportation Interventions Other (Comment)  [Patient will check insurance]  Utilities Interventions Intervention Not Indicated  Financial Strain Interventions Intervention Not Indicated  [Receives disability and wife works]     Assessment:   Goals Addressed             This Visit's Progress    BSW Goals       Current SDOH Barriers:  Transportation  Interventions: Patient interviewed and appropriate screenings performed Advised patient to contact [employer] for transportation options/coverage. Patient states he can ride the city bus as it is only 20 steps from his home.  Would need the bus that has a drop down lift to board.          Recommendation:   Patient will follow up with [employer] insurance for transportation coverage  Follow Up Plan:   Telephone follow up appointment date/time:  09/23/24 at 1pm  Tillman Gardener, BSW [place]  [facility], [facility] Social Worker Direct Dial: [phone]  Fax: [phone] Website: [email or web address]

## 2024-09-19 DIAGNOSIS — Z9483 Pancreas transplant status: Secondary | ICD-10-CM | POA: Diagnosis not present

## 2024-09-19 DIAGNOSIS — Z940 Kidney transplant status: Secondary | ICD-10-CM | POA: Diagnosis not present

## 2024-09-23 ENCOUNTER — Other Ambulatory Visit: Payer: Self-pay

## 2024-09-23 NOTE — Patient Instructions (Signed)

## 2024-09-23 NOTE — Patient Outreach (Signed)
 Social Drivers of Health  Community Resource and Care Coordination Visit Note   09/23/2024  Name: Dvaughn Fickle MRN: [id number] DOB:08-14-1971  Situation: Referral received for [facility] needs assessment and assistance related to Transportation. I obtained verbal consent from Patient.  Visit completed with Patient on the phone.   Background:   SDOH Interventions Today    Flowsheet Row Most Recent Value  SDOH Interventions   Transportation Interventions Patient Resources (Friends/Family)  [[employer] doesnot provide transportation. Family agreed to provide transportation assistance.]     Assessment:   Goals Addressed             This Visit's Progress    COMPLETED: BSW Goals       Current SDOH Barriers:  Transportation  Interventions: Patient interviewed and appropriate screenings performed Patient states he spoke to family who agreed to provide additional assistance with transportation.  Patient contacted his insurance provider and transportation is not included in his plan.          Recommendation:   Patient will work with family to assist with transportation.  Follow Up Plan:   Patient has achieved all patient stated goals. [employer] will be closed. Patient has been provided contact information should new needs arise.   Tillman Gardener, BSW [place]  [facility], [facility] Social Worker Direct Dial: [phone]  Fax: [phone] Website: [email or web address]

## 2024-10-08 ENCOUNTER — Telehealth: Payer: Self-pay | Admitting: Family Medicine

## 2024-10-08 NOTE — Telephone Encounter (Unsigned)
 Copied from CRM [id number]. Topic: Clinical - Medication Refill >> Oct 08, 2024  7:37 AM Eva FALCON wrote: Medication:  predniSONE  (DELTASONE ) 5 MG tablet    Has the patient contacted their pharmacy? Yes, state pharmacy has been trying to contact office, no response. (Agent: If no, request that the patient contact the pharmacy for the refill. If patient does not wish to contact the pharmacy document the reason why and proceed with request.) (Agent: If yes, when and what did the pharmacy advise?)  This is the patient's preferred pharmacy:  [id number] - [place],  - [address] AT [facility] [address] [place] [address] Phone: [phone] Fax: [phone]  Is this the correct pharmacy for this prescription? Yes If no, delete pharmacy and type the correct one.   Has the prescription been filled recently? Yes  Is the patient out of the medication? No  Has the patient been seen for an appointment in the last year OR does the patient have an upcoming appointment? Yes  Can we respond through MyChart? Yes  Agent: Please be advised that Rx refills may take up to 3 business days. We ask that you follow-up with your pharmacy.

## 2024-11-05 DIAGNOSIS — R609 Edema, unspecified: Secondary | ICD-10-CM | POA: Diagnosis not present

## 2024-11-05 DIAGNOSIS — I129 Hypertensive chronic kidney disease with stage 1 through stage 4 chronic kidney disease, or unspecified chronic kidney disease: Secondary | ICD-10-CM | POA: Diagnosis not present

## 2024-11-05 DIAGNOSIS — B348 Other viral infections of unspecified site: Secondary | ICD-10-CM | POA: Diagnosis not present

## 2024-11-05 DIAGNOSIS — Z9483 Pancreas transplant status: Secondary | ICD-10-CM | POA: Diagnosis not present

## 2024-11-05 DIAGNOSIS — Z940 Kidney transplant status: Secondary | ICD-10-CM | POA: Diagnosis not present

## 2024-11-06 LAB — LAB REPORT - SCANNED: Creatinine, POC: 259.2 mg/dL

## 2024-11-18 ENCOUNTER — Encounter: Admitting: Family Medicine

## 2024-11-20 ENCOUNTER — Ambulatory Visit: Admitting: Podiatry

## 2024-11-20 ENCOUNTER — Encounter: Payer: Self-pay | Admitting: Podiatry

## 2024-11-20 VITALS — Ht 70.0 in | Wt 191.0 lb

## 2024-11-20 DIAGNOSIS — E1140 Type 2 diabetes mellitus with diabetic neuropathy, unspecified: Secondary | ICD-10-CM

## 2024-11-20 DIAGNOSIS — L600 Ingrowing nail: Secondary | ICD-10-CM

## 2024-11-20 DIAGNOSIS — Q828 Other specified congenital malformations of skin: Secondary | ICD-10-CM

## 2024-11-20 DIAGNOSIS — B351 Tinea unguium: Secondary | ICD-10-CM

## 2024-11-20 NOTE — Patient Instructions (Signed)

## 2024-11-23 NOTE — Progress Notes (Signed)
 Subjective:   Patient ID: Gregory Allen, male   DOB: 54 y.o.   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  He is found to have painful lesions bilateral fifth metatarsal that he tries to work on and has an ingrown toenail deformity now the left big toe painful   ROS      Objective:  Physical Exam  Vascular status was found to be intact with good digital perfusion.  Patient's feet are warm hair growth noted there is diminishment of sharp dull vibratory and neuropathic like symptomatology secondary to his long-term history of diabetes with an incurvated left hallux medial border painful when pressed and chronic lesions with lucent core subfifth metatarsal head bilateral.  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  At this time for the left big toenail I have recommended correction of deformity I did explain risk I allowed him to read and signed consent form and I removed the medial border exposed matrix applied phenol 3 applications 30 seconds followed by alcohol  lavage sterile dressing gave instructions on soaks wear dressing 24 hours take it off earlier if throbbing were to occur and I did sharp sterile debridement of lesion bilateral no iatrogenic bleeding reappoint routine care or if there is any issues with the nailbed he is instructed to come in or call
